# Patient Record
Sex: Male | Born: 1960 | Race: Black or African American | Hispanic: No | Marital: Married | Smoking: Former smoker
Health system: Southern US, Community
[De-identification: ages and names within clinical notes are randomized; demographics above are authoritative.]

## PROBLEM LIST (undated history)

## (undated) DIAGNOSIS — R0600 Dyspnea, unspecified: Secondary | ICD-10-CM

## (undated) DIAGNOSIS — H4050X0 Glaucoma secondary to other eye disorders, unspecified eye, stage unspecified: Secondary | ICD-10-CM

## (undated) MED FILL — Fosaprepitant Dimeglumine For IV Infusion 150 MG (Base Eq): INTRAVENOUS | Qty: 5 | Status: AC

---

## 2003-06-24 ENCOUNTER — Encounter (INDEPENDENT_AMBULATORY_CARE_PROVIDER_SITE_OTHER): Payer: Self-pay | Admitting: *Deleted

## 2003-06-24 ENCOUNTER — Ambulatory Visit (HOSPITAL_COMMUNITY): Admission: RE | Admit: 2003-06-24 | Discharge: 2003-06-24 | Payer: Self-pay | Admitting: Internal Medicine

## 2005-08-31 ENCOUNTER — Emergency Department (HOSPITAL_COMMUNITY): Admission: EM | Admit: 2005-08-31 | Discharge: 2005-09-01 | Payer: Self-pay | Admitting: Emergency Medicine

## 2009-03-13 ENCOUNTER — Emergency Department (HOSPITAL_COMMUNITY): Admission: EM | Admit: 2009-03-13 | Discharge: 2009-03-13 | Payer: Self-pay | Admitting: Emergency Medicine

## 2010-08-15 LAB — CBC
HCT: 41.8 % (ref 39.0–52.0)
Hemoglobin: 13.8 g/dL (ref 13.0–17.0)
MCHC: 33.1 g/dL (ref 30.0–36.0)
MCV: 87.8 fL (ref 78.0–100.0)
RDW: 15.1 % (ref 11.5–15.5)

## 2010-08-15 LAB — POCT I-STAT, CHEM 8
BUN: 13 mg/dL (ref 6–23)
Calcium, Ion: 1.15 mmol/L (ref 1.12–1.32)
Chloride: 108 mEq/L (ref 96–112)
Creatinine, Ser: 1 mg/dL (ref 0.4–1.5)
TCO2: 24 mmol/L (ref 0–100)

## 2010-08-15 LAB — WOUND CULTURE: Gram Stain: NONE SEEN

## 2010-08-15 LAB — DIFFERENTIAL
Basophils Absolute: 0 10*3/uL (ref 0.0–0.1)
Eosinophils Absolute: 0.1 10*3/uL (ref 0.0–0.7)
Lymphs Abs: 2 10*3/uL (ref 0.7–4.0)
Neutro Abs: 3.4 10*3/uL (ref 1.7–7.7)

## 2010-12-14 ENCOUNTER — Inpatient Hospital Stay (INDEPENDENT_AMBULATORY_CARE_PROVIDER_SITE_OTHER)
Admission: RE | Admit: 2010-12-14 | Discharge: 2010-12-14 | Disposition: A | Discharge status: Home / Self Care | Payer: Managed Care, Other (non HMO) | Source: Ambulatory Visit | Attending: Family Medicine | Admitting: Family Medicine

## 2010-12-14 DIAGNOSIS — L723 Sebaceous cyst: Secondary | ICD-10-CM

## 2016-11-06 ENCOUNTER — Encounter (HOSPITAL_COMMUNITY): Payer: Self-pay | Admitting: Emergency Medicine

## 2016-11-06 ENCOUNTER — Emergency Department (HOSPITAL_COMMUNITY)
Admission: EM | Admit: 2016-11-06 | Discharge: 2016-11-06 | Disposition: A | Discharge status: Home / Self Care | Payer: 59 | Attending: Emergency Medicine | Admitting: Emergency Medicine

## 2016-11-06 ENCOUNTER — Emergency Department (HOSPITAL_COMMUNITY): Payer: 59

## 2016-11-06 DIAGNOSIS — M545 Low back pain, unspecified: Secondary | ICD-10-CM

## 2016-11-06 DIAGNOSIS — Z87891 Personal history of nicotine dependence: Secondary | ICD-10-CM | POA: Insufficient documentation

## 2016-11-06 MED ORDER — METHOCARBAMOL 500 MG PO TABS: 500.0000 mg | ORAL_TABLET | Freq: Two times a day (BID) | ORAL | 0 refills | Status: DC | PRN

## 2016-11-06 MED ORDER — HYDROCODONE-ACETAMINOPHEN 5-325 MG PO TABS: 2.0000 | ORAL_TABLET | Freq: Four times a day (QID) | ORAL | 0 refills | Status: DC | PRN

## 2016-11-06 NOTE — ED Provider Notes (Signed)
[facility]-EMERGENCY DEPT Provider Note   CSN: [id number] Arrival date & time: 11/06/16  1119  By signing my name below, I, Linna Darner, attest that this documentation has been prepared under the direction and in the presence of [facility], PA-C. Electronically Signed: Linna Darner, Scribe. 11/06/2016. 12:05 PM.  History   Chief Complaint Chief Complaint  Patient presents with  . Back Pain   The history is provided by the patient. No language interpreter was used.    HPI Comments: Timothy Hunter is a 56 y.o. male who presents to the Emergency Department complaining of constant, waxing and waning, non-radiating lower back pain for three days. Patient also reports some intermittent lower back spasms that cause severe pain. He rates his current pain at 3/10 in severity but notes it is 10/10 at worst when spasms occur. There is no known injury/trigger of patient's back pain and he denies any recent frequent bending or heavy lifting. Patient states his back pain is worse with ambulating, position changes, and sitting for a prolonged period of time. He has tried a heating pad, ice, and Motrin with mild, transient improvement of his back pain. He has no prior h/o persistent back pain. No h/o kidney stones. No tobacco or regular alcohol use. He states he will smoke marijuana on rare occasions but denies any other illicit drug use. Does not drink water. Wife states typically will drink mountain dew or tea. He also gets charley horses occasionally, but none in the last few months. No IV drug use. NKDA. Patient denies numbness/tingling, focal weakness, urinary/bowel incontinence, saddle anesthesia, dysuria, hematuria, or any other associated symptoms.  History reviewed. No pertinent past medical history.  There are no active problems to display for this patient.   History reviewed. No pertinent surgical history.     Home Medications    Prior to Admission medications   Medication Sig Start Date  End Date Taking? Authorizing Provider  HYDROcodone-acetaminophen (NORCO/VICODIN) 5-325 MG tablet Take 2 tablets by mouth every 6 (six) hours as needed. 11/06/16   Halo Laski, Chase Picket, PA-C  methocarbamol (ROBAXIN) 500 MG tablet Take 1 tablet (500 mg total) by mouth 2 (two) times daily as needed for muscle spasms. 11/06/16   Genetta Fiero, Chase Picket, PA-C    Family History No family history on file.  Social History Social History  Substance Use Topics  . Smoking status: Former Games developer  . Smokeless tobacco: Not on file  . Alcohol use Yes     Comment: occasionally     Allergies   Patient has no known allergies.   Review of Systems Review of Systems  Gastrointestinal: Negative for abdominal pain, nausea and vomiting.       Negative for bowel incontinence.  Genitourinary: Negative for dysuria and hematuria.       Negative for urinary incontinence.  Musculoskeletal: Positive for back pain. Negative for neck pain.  Neurological: Negative for weakness and numbness.       Negative for saddle anesthesia.   Physical Exam Updated Vital Signs BP 126/76 (BP Location: Right Arm)   Pulse 64   Temp 98.2 F (36.8 C) (Oral)   Resp 18   Ht 5\' 9"  (1.753 m)   Wt 73.8 kg (162 lb 9.6 oz)   SpO2 99%   BMI 24.01 kg/m   Physical Exam  Constitutional: He is oriented to person, place, and time. He appears well-developed and well-nourished. No distress.  HENT:  Head: Normocephalic and atraumatic.  Cardiovascular: Normal rate, regular rhythm and  normal heart sounds.   No murmur heard. Pulmonary/Chest: Effort normal and breath sounds normal. No respiratory distress.  Abdominal: Soft. He exhibits no distension. There is no tenderness.  Musculoskeletal:  Diffuse tenderness across lumbar spine. SLR negative bilaterally for radicular symptoms. Full ROM. Steady gait.  Neurological: He is alert and oriented to person, place, and time. He displays normal reflexes.  Bilateral lower extremities  neurovascularly intact.   Skin: Skin is warm and dry.  Nursing note and vitals reviewed.  ED Treatments / Results  Labs (all labs ordered are listed, but only abnormal results are displayed) Labs Reviewed - No data to display  EKG  EKG Interpretation None       Radiology Dg Lumbar Spine Complete  Result Date: 11/06/2016 CLINICAL DATA:  Back pain.  Spasms. EXAM: LUMBAR SPINE - COMPLETE 4+ VIEW COMPARISON:  CT scan of the abdomen dated 09/09/2014 FINDINGS: There is slight narrowing of the L4-5 disc space. There is no subluxation. Prominent transverse processes of L5 articulate with the sacrum. No pars defects. No significant facet arthritis. Incidental note is made of aortic atherosclerosis. IMPRESSION: 1. Slight disc space narrowing at L4-5. 2. Aortic atherosclerosis. Electronically Signed   By: Francene Boyers M.D.   On: 11/06/2016 12:43    Procedures Procedures (including critical care time)  DIAGNOSTIC STUDIES: Oxygen Saturation is 100% on RA, normal by my interpretation.    COORDINATION OF CARE: 12:04 PM Discussed treatment plan with pt at bedside and pt agreed to plan.  Medications Ordered in ED Medications - No data to display   Initial Impression / Assessment and Plan / ED Course  I have reviewed the triage vital signs and the nursing notes.  Pertinent labs & imaging results that were available during my care of the patient were reviewed by me and considered in my medical decision making (see chart for details).    Timothy Hunter is a 56 y.o. male who presents to ED for low back spasms. Patient demonstrates no lower extremity weakness, saddle anesthesia, bowel or bladder incontinence or neuro deficits. No concern for cauda equina. X-ray obtained with slight disc space narrowing at L4-L5, but otherwise unremarkable. No fevers or other infectious symptoms to suggest that the patient's back pain is due to an infection. Lower extremities are neurovascularly intact and  patient is ambulating without difficulty. I have reviewed return precautions, including the development of any of these signs or symptoms and the patient has voiced understanding. I reviewed supportive care instructions. PCP or neurosurgery follow-up if symptoms do not improve. Referral information for both given. Reviewed [place] controlled substances database and patient has no active prescriptions. RX for robaxin and short course pain meds given. Patient voiced understanding and agreement with plan. All questions answered.   Final Clinical Impressions(s) / ED Diagnoses   Final diagnoses:  Acute bilateral low back pain without sciatica    New Prescriptions Discharge Medication List as of 11/06/2016  1:00 PM    START taking these medications   Details  HYDROcodone-acetaminophen (NORCO/VICODIN) 5-325 MG tablet Take 2 tablets by mouth every 6 (six) hours as needed., Starting Wed 11/06/2016, Print    methocarbamol (ROBAXIN) 500 MG tablet Take 1 tablet (500 mg total) by mouth 2 (two) times daily as needed for muscle spasms., Starting Wed 11/06/2016, Print       I personally performed the services described in this documentation, which was scribed in my presence. The recorded information has been reviewed and is accurate.    Shakaya Bhullar,  Chase PicketJaime Pilcher, PA-C 11/06/16 1405    Mesner, Barbara CowerJason, MD 11/06/16 [phone]

## 2016-11-06 NOTE — ED Notes (Signed)
Bed: WTR9 Expected date:  Expected time:  Means of arrival:  Comments: 

## 2016-11-06 NOTE — ED Triage Notes (Signed)
Pt c/o intermittent back spasms onset Friday, pain is progressive. Ambulatory. No injury. No bowel or bladder changes.  Worse with sitting, standing, moving certain ways.

## 2016-11-06 NOTE — Discharge Instructions (Signed)
It was my pleasure taking care of you today!   You have been seen in the Emergency Department today for back pain.   Ibuprofen as needed for pain. Robaxin is your muscle relaxer to take as needed. You will have a small amount of pain medication to use only as needed for severe pain - This can make you very drowsy - please do not drink alcohol, operate heavy machinery or drive on this medication. In addition to this, use ice and/or heat for additional pain relief.  Your back pain should get better over the next week. Please follow up with your doctor this week for a recheck if still having symptoms. If you do not have a primary doctor, see the information below. If you cannot get in with a primary doctor, you could also follow up with the spine clinic listed if symptoms do not improve.   COLD THERAPY DIRECTIONS:  Ice or gel packs can be used to reduce both pain and swelling. Ice is the most helpful within the first 24 to 48 hours after an injury or flareup from overusing a muscle or joint.  Ice is effective, has very few side effects, and is safe for most people to use.    Return to the ED for worsening back pain, fever, weakness or numbness of either leg, or if you develop either (1) an inability to urinate or have bowel movements, or (2) loss of your ability to control your bathroom functions (if you start having "accidents"), or if you develop other new symptoms that concern you.  To find a primary care or specialty doctor please call [phone] or [phone] to access "[place] Find a Doctor Service."  You may also go on the [facility] website at [email or web address]  There are also multiple [facility] practices throughout the [place] that are frequently accepting new patients. You may find a clinic that is close to your home and contact them.  [facility] - [address] [place] [phone]  [facility] in [place] (also locations in [place] and [place]) - [address] [place] 857 [phone]  [facility] - [address] [place] [phone]

## 2019-11-16 ENCOUNTER — Other Ambulatory Visit: Payer: Self-pay | Admitting: Urgent Care

## 2019-11-16 DIAGNOSIS — Z87891 Personal history of nicotine dependence: Secondary | ICD-10-CM

## 2019-12-03 ENCOUNTER — Other Ambulatory Visit: Payer: Self-pay | Admitting: Urgent Care

## 2019-12-07 ENCOUNTER — Ambulatory Visit
Admission: RE | Admit: 2019-12-07 | Discharge: 2019-12-07 | Disposition: A | Discharge status: Home / Self Care | Payer: No Typology Code available for payment source | Source: Ambulatory Visit | Attending: Urgent Care | Admitting: Urgent Care

## 2019-12-07 DIAGNOSIS — Z87891 Personal history of nicotine dependence: Secondary | ICD-10-CM

## 2019-12-26 DIAGNOSIS — L308 Other specified dermatitis: Secondary | ICD-10-CM | POA: Insufficient documentation

## 2020-01-12 DIAGNOSIS — L219 Seborrheic dermatitis, unspecified: Secondary | ICD-10-CM | POA: Insufficient documentation

## 2020-01-12 DIAGNOSIS — J439 Emphysema, unspecified: Secondary | ICD-10-CM | POA: Insufficient documentation

## 2020-10-20 ENCOUNTER — Other Ambulatory Visit: Payer: Self-pay | Admitting: Urgent Care

## 2020-10-20 DIAGNOSIS — R911 Solitary pulmonary nodule: Secondary | ICD-10-CM

## 2020-10-29 DIAGNOSIS — R3129 Other microscopic hematuria: Secondary | ICD-10-CM | POA: Insufficient documentation

## 2020-10-29 DIAGNOSIS — R911 Solitary pulmonary nodule: Secondary | ICD-10-CM | POA: Insufficient documentation

## 2021-12-11 IMAGING — CT CT CHEST LUNG CANCER SCREENING LOW DOSE W/O CM
2 of 5 series · 15 of 40 positions shown, 18 images · non-contrast
Comparison: None.

CLINICAL DATA: 59-year-old male former smoker, quit 5 years ago,
with 70 pack-year history of smoking, for initial lung cancer
screening

EXAM:
CT CHEST WITHOUT CONTRAST LOW-DOSE FOR LUNG CANCER SCREENING
TECHNIQUE: Multidetector CT imaging of the chest was performed following the
standard protocol without IV contrast.

[Series 4: lung 1.00 br44 cor · coronal · 0.66mm/px · 3 of 285 slices shown]
[im 57/285  lung]
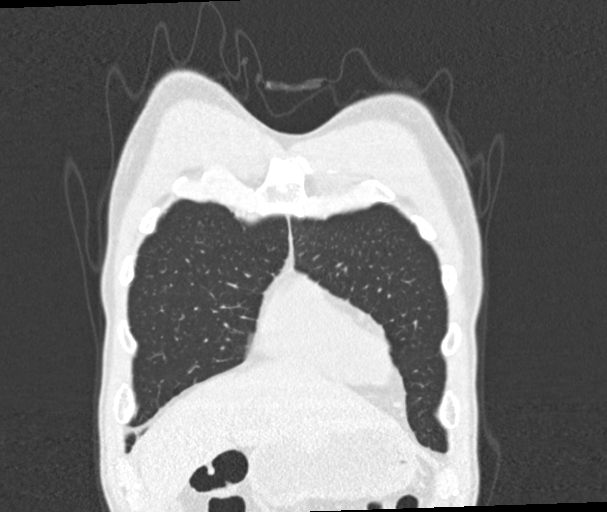
[im 114/285  lung]
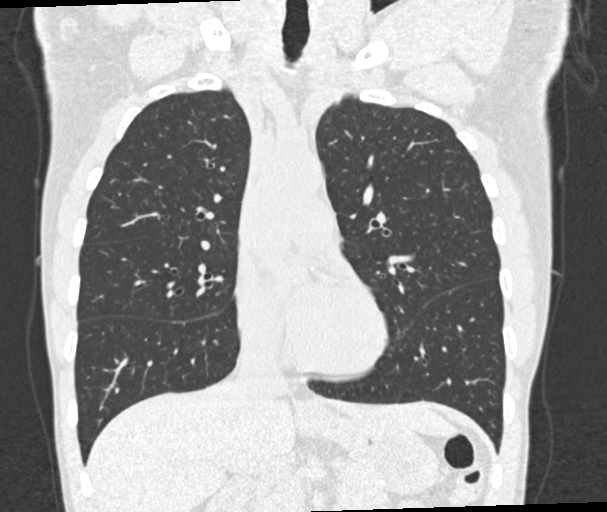
[im 171/285  lung]
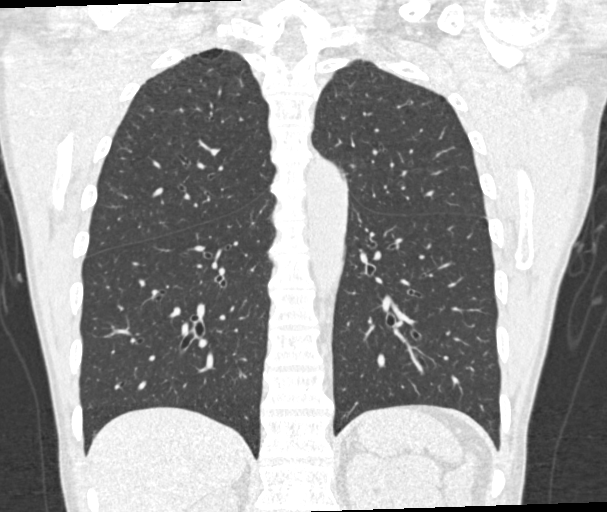

[Series 9: lung 1.00 br60 axial · axial · 0.77mm/px · z∈[-1105,-801]mm · 12 of 336 slices shown, 15 images]
[im 16/336  mediastinal]
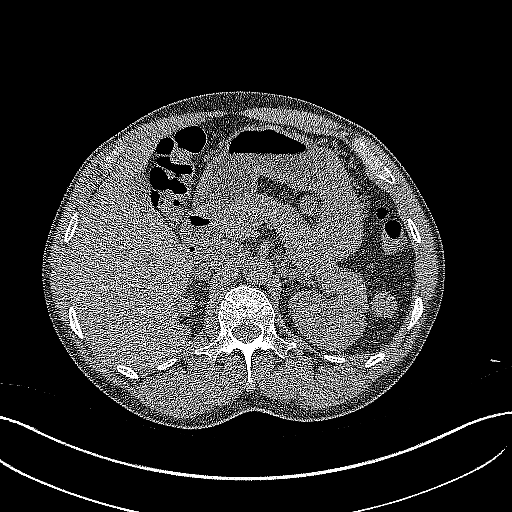
[im 16/336  lung]
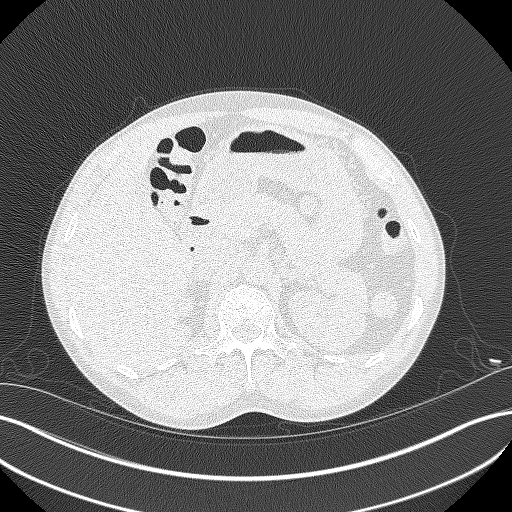
[im 46/336  lung]
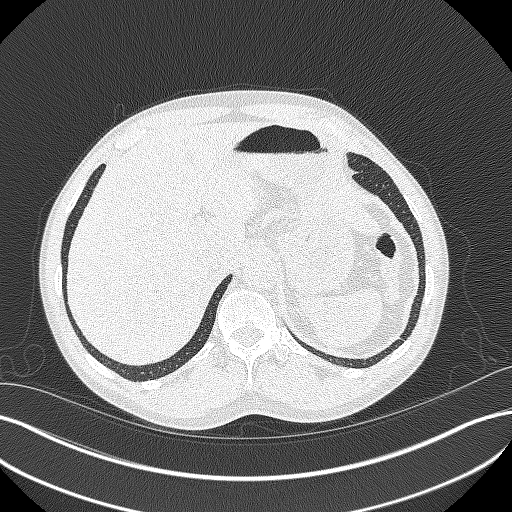
[im 77/336  lung]
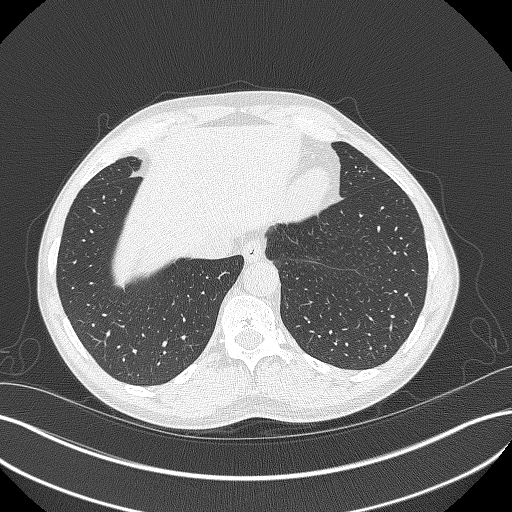
[im 107/336  lung]
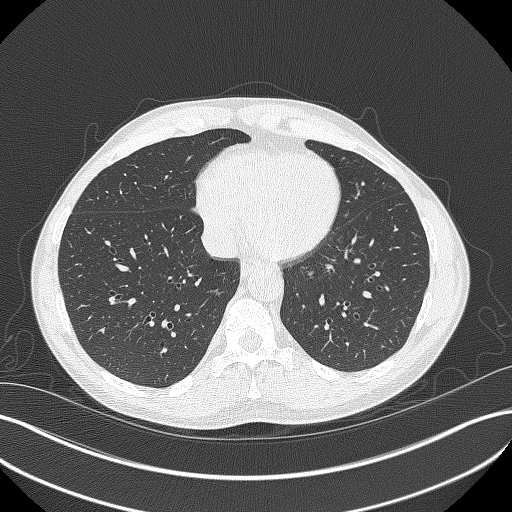
[im 122/336  mediastinal]
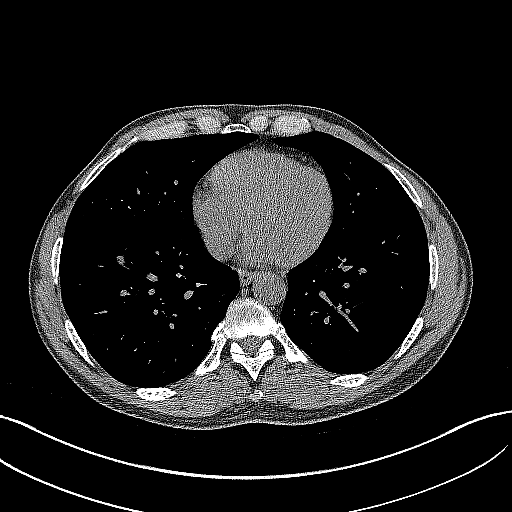
[im 122/336  lung]
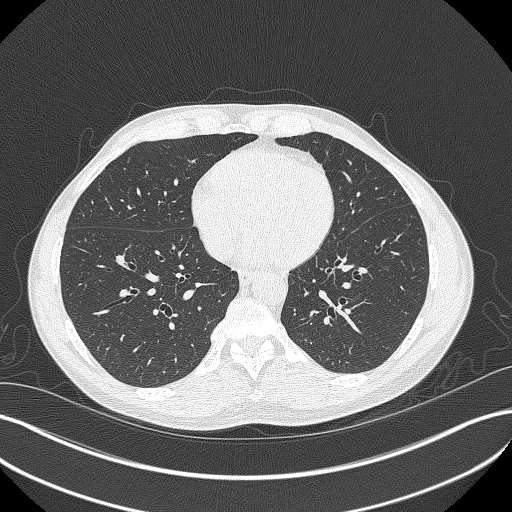
[im 153/336  lung]
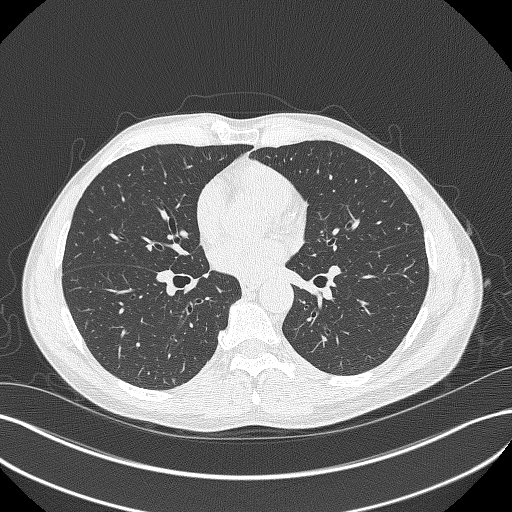
[im 183/336  lung]
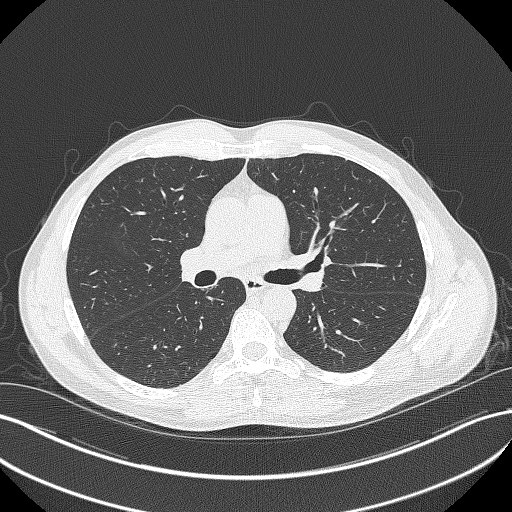
[im 214/336  lung]
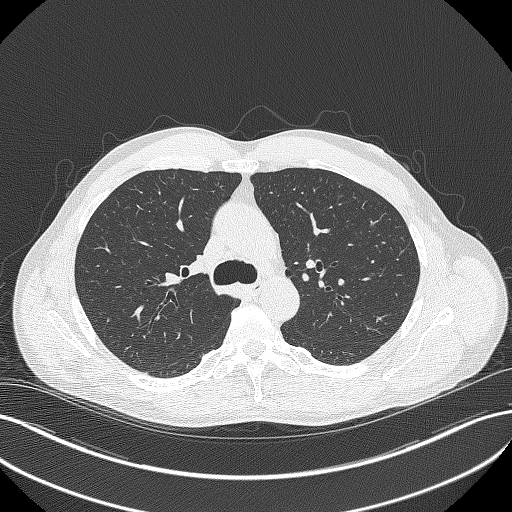
[im 229/336  mediastinal]
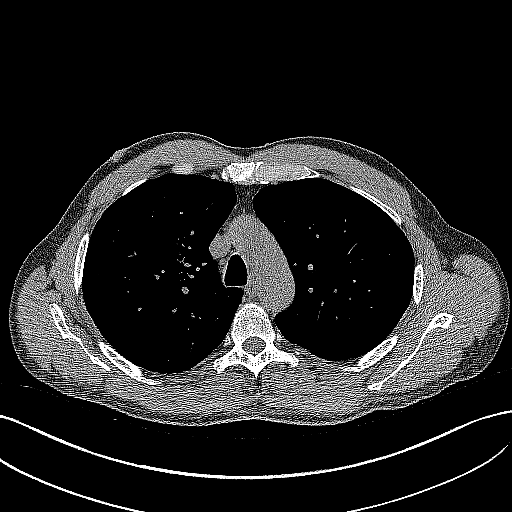
[im 229/336  lung]
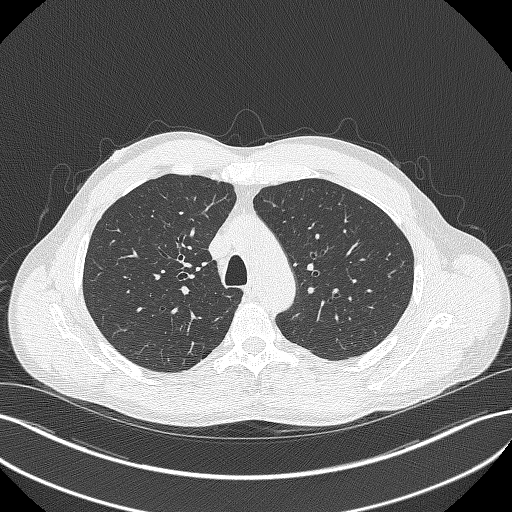
[im 259/336  lung]
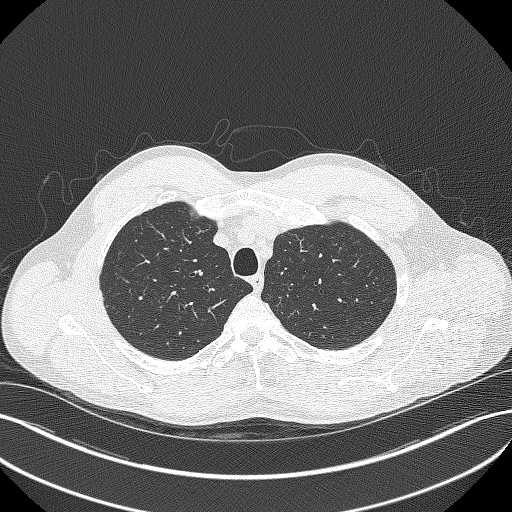
[im 290/336  lung]
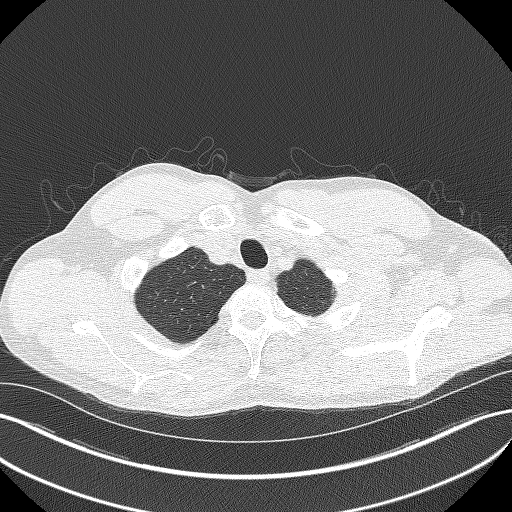
[im 320/336  lung]
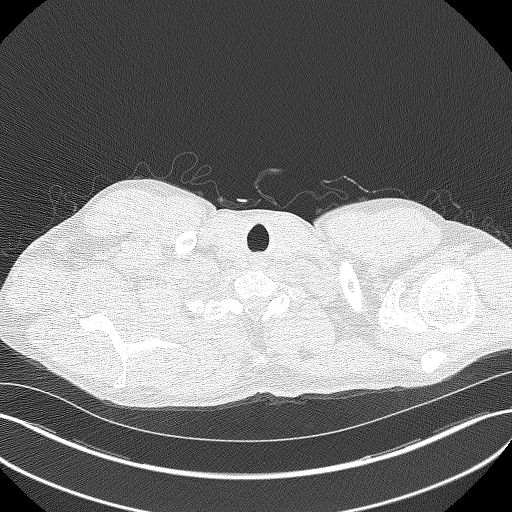

[15 of 40 positions shown; findings below may reference images not displayed]

FINDINGS: Cardiovascular: Heart is normal in size.  No pericardial effusion.

No evidence of thoracic aortic aneurysm. Mild atherosclerotic
calcifications of the aortic arch.

Mild coronary atherosclerosis of the LAD.

Mediastinum/Nodes: No suspicious mediastinal lymphadenopathy.

Visualized thyroid is unremarkable.

Lungs/Pleura: Mild biapical pleural-parenchymal scarring.

Mild centrilobular and paraseptal emphysematous changes, upper lung
predominant.

Two small right lung nodules measuring up to 3.5 mm in the right
lower lobe.

No focal consolidation.

No pleural effusion or pneumothorax.

Upper Abdomen: Visualized upper abdomen is grossly unremarkable.

Musculoskeletal: Visualized osseous structures are within normal
limits.
IMPRESSION: Lung-RADS 2, benign appearance or behavior. Continue annual
screening with low-dose chest CT without contrast in 12 months.

Aortic Atherosclerosis (IVOEK-FEC.C) and Emphysema (IVOEK-Z0L.C).

## 2022-01-23 DIAGNOSIS — Z87891 Personal history of nicotine dependence: Secondary | ICD-10-CM | POA: Insufficient documentation

## 2022-01-25 DIAGNOSIS — E119 Type 2 diabetes mellitus without complications: Secondary | ICD-10-CM | POA: Insufficient documentation

## 2022-04-09 ENCOUNTER — Other Ambulatory Visit: Payer: Self-pay

## 2022-04-09 ENCOUNTER — Ambulatory Visit: Payer: No Typology Code available for payment source | Attending: Neurosurgery | Admitting: Physical Therapy

## 2022-04-09 ENCOUNTER — Encounter: Payer: Self-pay | Admitting: Physical Therapy

## 2022-04-09 DIAGNOSIS — M5459 Other low back pain: Secondary | ICD-10-CM | POA: Insufficient documentation

## 2022-04-09 DIAGNOSIS — M6281 Muscle weakness (generalized): Secondary | ICD-10-CM | POA: Diagnosis present

## 2022-04-09 NOTE — Therapy (Signed)
OUTPATIENT PHYSICAL THERAPY THORACOLUMBAR EVALUATION  Patient Name: Timothy Hunter MRN: [id number] DOB:Jan 07, 1961, 61 y.o., male Today's Date: 04/09/2022   PT End of Session - 04/09/22 1159     Visit Number 1    Number of Visits --   1-2x/week   Date for PT Re-Evaluation 06/04/22    Authorization Type [employer]    PT Start Time 1130    PT Stop Time 1205    PT Time Calculation (min) 35 min             History reviewed. No pertinent past medical history. History reviewed. No pertinent surgical history. There are no problems to display for this patient.   PCP: Patient, No Pcp Per  REFERRING PROVIDER: Bedelia Person, MD  THERAPY DIAG:  Other low back pain - Plan: PT plan of care cert/re-cert  Muscle weakness - Plan: PT plan of care cert/re-cert  REFERRING DIAG: Dorsalgia, unspecified [M54.9]   Rationale for Evaluation and Treatment:  Rehabilitation  SUBJECTIVE:  PERTINENT PAST HISTORY:  None      PRECAUTIONS: None  WEIGHT BEARING RESTRICTIONS No  FALLS:  Has patient fallen in last 6 months? No, Number of falls: 0  MOI/History of condition:  Onset date: 9/14  SUBJECTIVE STATEMENT  Timothy Hunter is a 61 y.o. male who presents to clinic with chief complaint of low back pain which was isolated to start, but then "spread" to his abdomen and sides. He states that he had an x-ray which was normal and now must complete PT in order to get an MRI.  He was prescribed meloxicam which is somewhat helpful.  Denies n/t or pain in LE.  Denies BB changes.  Denies saddle anesthesia.  Denies significant weight loss.  He states that he has a physical job and had what he felt like a slight muscle strain and this slowly got worse over time.     Red flags:  denies   Pain:  Are you having pain? Yes Pain location: low back, abdomen (generalized) NPRS scale:  4/10 to 10/10 Aggravating factors: lifting, bending Relieving factors: rest, lying down Pain description:  intermittent, constant, and aching Stage: Chronic Stability: staying the same 24 hour pattern: no clear pattern   Occupation: lifting paper records (lifting mod/heavy boxes)  Assistive Device: NA  Hand Dominance: NA  Patient Goals/Specific Activities: reduce pain, get MRI   OBJECTIVE:   DIAGNOSTIC FINDINGS:  Pt reports x-ray was clear for significant abnormalities   GENERAL OBSERVATION/GAIT:  Pain moving from sitting to standing, gowers sign  SENSATION:  Light touch: Appears intact  MUSCLE LENGTH: Hamstrings: Right no restriction; Left no restriction ASLR: Right ASLR = PSLR; Left ASLR = PSLR Thomas test: Right subtle restriction; Left subtle restriction    LUMBAR AROM  AROM AROM  04/09/2022  Flexion Fingertips to mid shin (limited by ~25%), w/ concordant pain  Extension limited by 25%, w/ concordant pain  Right lateral flexion limited by 25%, w/ concordant pain  Left lateral flexion limited by 25%  Right rotation limited by 25%  Left rotation limited by 25%    (Blank rows = not tested)    LUMBAR SPECIAL TESTS:  Straight leg raise: L (-), R (-)  LE MMT:  MMT Right 04/09/2022 Left 04/09/2022  Hip flexion (L2, L3) 4+ 4+  Knee extension (L3) 4+ 4+  Knee flexion 4+ 4+  Hip abduction 3+ 3+  Hip extension 3+ 3+  Hip external rotation    Hip internal rotation  Hip adduction    Ankle dorsiflexion (L4)    Ankle plantarflexion (S1)    Ankle inversion    Ankle eversion    Great Toe ext (L5)    Grossly     (Blank rows = not tested, score listed is out of 5 possible points.  N = WNL, D = diminished, C = clear for gross weakness with myotome testing, * = concordant pain with testing)   LE ROM:  ROM Right 04/09/2022 Left 04/09/2022  Hip flexion N N  Hip extension    Hip abduction    Hip adduction    Hip internal rotation N N  Hip external rotation N N  Knee flexion    Knee extension    Ankle dorsiflexion    Ankle plantarflexion    Ankle  inversion    Ankle eversion      (Blank rows = not tested, N = WNL, * = concordant pain with testing)  Functional Tests  Eval (04/09/2022)    Bridge: unable full ROM                                                            PALPATION:   TTP R QL  PATIENT SURVEYS:  FOTO 39 - > 62   TODAY'S TREATMENT  Creating, reviewing, and completing below HEP  PATIENT EDUCATION:  POC, diagnosis, prognosis, HEP, and outcome measures.  Pt educated via explanation, demonstration, and handout (HEP).  Pt confirms understanding verbally.   HOME EXERCISE PROGRAM: Access Code: [email or web address] URL: [email or web address] Date: 04/09/2022 Prepared by: Alphonzo Severance  Exercises - Supine Posterior Pelvic Tilt  - 2 x daily - 7 x weekly - 2 sets - 10 reps - 5'' hold - Posterior Pelvic Tilt with Adduction Using Pillow in Hooklying  - 2 x daily - 7 x weekly - 10 reps - 10 second hold - Hooklying Isometric Clamshell  - 1 x daily - 7 x weekly - 3 sets - 10 reps  ASTERISK SIGNS   Asterisk Signs Eval (04/09/2022)       Fwd toe touch Gowers sign       Max pain 10/10       brige Unable to complete full range                         ASSESSMENT:  CLINICAL IMPRESSION: Timothy Hunter is a 61 y.o. male who presents to clinic with signs and sxs consistent with mechanical low back pain with atypical pain presentation.  No signs of radicular sxs on exam or subjective.  He has significant local tenderness over L QL and paraspinals  surrounding TL junction.  His abdominal pain presentation is atypical but being followed by MD.  If no improvement he plans on getting MRI.    OBJECTIVE IMPAIRMENTS: Pain, lumbar ROM, LE/hip/core stregth  ACTIVITY LIMITATIONS: bending, lifting, working  PERSONAL FACTORS: See medical history and pertinent history   REHAB POTENTIAL: Good  CLINICAL DECISION MAKING: Stable/uncomplicated  EVALUATION COMPLEXITY: Low   GOALS:   SHORT TERM GOALS: Target date:  05/07/2022  Timothy Hunter will be >75% HEP compliant to improve carryover between sessions and facilitate independent management of condition  Evaluation (04/09/2022): ongoing Goal status: INITIAL   LONG TERM GOALS: Target date: 06/04/2022  Timothy Hunter will improve  FOTO score to 62 as a proxy for functional improvement  Evaluation/Baseline (04/09/2022): 39 Goal status: INITIAL   2.  Zakariya will self report >/= 50% decrease in pain from evaluation   Evaluation/Baseline (04/09/2022): 10/10 max pain Goal status: INITIAL   3.  Westly will report confidence in self management of condition at time of discharge with advanced HEP  Evaluation/Baseline (04/09/2022): unable to self manage Goal status: INITIAL   4.  Kaiyan will be able to perform full ROM supine bridge, not limited by pain  Evaluation/Baseline (04/09/2022): limited Goal status: INITIAL    PLAN: PT FREQUENCY: 1-2x/week  PT DURATION: 8 weeks (Ending 06/04/2022)  PLANNED INTERVENTIONS: Therapeutic exercises, Aquatic therapy, Therapeutic activity, Neuro Muscular re-education, Gait training, Patient/Family education, Joint mobilization, Dry Needling, Electrical stimulation, Spinal mobilization and/or manipulation, Moist heat, Taping, Vasopneumatic device, Ionotophoresis 4mg /ml Dexamethasone, and Manual therapy  PLAN FOR NEXT SESSION: progressive hip/core/LE strengthening, modalities and manual PRN   Alphonzo Severance PT, DPT 04/09/2022, 12:15 PM

## 2022-04-17 ENCOUNTER — Encounter: Payer: Managed Care, Other (non HMO) | Admitting: Physical Therapy

## 2023-02-26 DIAGNOSIS — R61 Generalized hyperhidrosis: Secondary | ICD-10-CM | POA: Diagnosis not present

## 2023-02-26 DIAGNOSIS — R634 Abnormal weight loss: Secondary | ICD-10-CM | POA: Diagnosis not present

## 2023-02-26 DIAGNOSIS — R1013 Epigastric pain: Secondary | ICD-10-CM | POA: Diagnosis not present

## 2023-03-09 ENCOUNTER — Other Ambulatory Visit: Payer: Self-pay

## 2023-03-09 ENCOUNTER — Encounter (HOSPITAL_BASED_OUTPATIENT_CLINIC_OR_DEPARTMENT_OTHER): Payer: Self-pay

## 2023-03-09 ENCOUNTER — Emergency Department (HOSPITAL_BASED_OUTPATIENT_CLINIC_OR_DEPARTMENT_OTHER): Payer: 59

## 2023-03-09 ENCOUNTER — Inpatient Hospital Stay (HOSPITAL_BASED_OUTPATIENT_CLINIC_OR_DEPARTMENT_OTHER)
Admission: EM | Admit: 2023-03-09 | Discharge: 2023-03-12 | DRG: 357 | Disposition: A | Discharge status: Home / Self Care | Payer: 59 | Attending: Internal Medicine | Admitting: Internal Medicine

## 2023-03-09 DIAGNOSIS — D649 Anemia, unspecified: Secondary | ICD-10-CM | POA: Diagnosis not present

## 2023-03-09 DIAGNOSIS — K229 Disease of esophagus, unspecified: Secondary | ICD-10-CM | POA: Diagnosis not present

## 2023-03-09 DIAGNOSIS — D62 Acute posthemorrhagic anemia: Secondary | ICD-10-CM | POA: Diagnosis not present

## 2023-03-09 DIAGNOSIS — E876 Hypokalemia: Secondary | ICD-10-CM | POA: Diagnosis present

## 2023-03-09 DIAGNOSIS — K31811 Angiodysplasia of stomach and duodenum with bleeding: Secondary | ICD-10-CM | POA: Diagnosis not present

## 2023-03-09 DIAGNOSIS — K31A11 Gastric intestinal metaplasia without dysplasia, involving the antrum: Secondary | ICD-10-CM | POA: Diagnosis not present

## 2023-03-09 DIAGNOSIS — R9389 Abnormal findings on diagnostic imaging of other specified body structures: Secondary | ICD-10-CM | POA: Diagnosis not present

## 2023-03-09 DIAGNOSIS — Z87891 Personal history of nicotine dependence: Secondary | ICD-10-CM

## 2023-03-09 DIAGNOSIS — R1110 Vomiting, unspecified: Secondary | ICD-10-CM | POA: Diagnosis not present

## 2023-03-09 DIAGNOSIS — K922 Gastrointestinal hemorrhage, unspecified: Secondary | ICD-10-CM

## 2023-03-09 DIAGNOSIS — C801 Malignant (primary) neoplasm, unspecified: Secondary | ICD-10-CM | POA: Diagnosis not present

## 2023-03-09 DIAGNOSIS — N3000 Acute cystitis without hematuria: Secondary | ICD-10-CM | POA: Diagnosis present

## 2023-03-09 DIAGNOSIS — C8512 Unspecified B-cell lymphoma, intrathoracic lymph nodes: Secondary | ICD-10-CM | POA: Diagnosis not present

## 2023-03-09 DIAGNOSIS — R109 Unspecified abdominal pain: Secondary | ICD-10-CM | POA: Diagnosis not present

## 2023-03-09 DIAGNOSIS — H4050X0 Glaucoma secondary to other eye disorders, unspecified eye, stage unspecified: Secondary | ICD-10-CM

## 2023-03-09 DIAGNOSIS — Z8619 Personal history of other infectious and parasitic diseases: Secondary | ICD-10-CM | POA: Diagnosis not present

## 2023-03-09 DIAGNOSIS — K921 Melena: Secondary | ICD-10-CM | POA: Diagnosis not present

## 2023-03-09 DIAGNOSIS — R918 Other nonspecific abnormal finding of lung field: Secondary | ICD-10-CM | POA: Diagnosis not present

## 2023-03-09 DIAGNOSIS — Z79899 Other long term (current) drug therapy: Secondary | ICD-10-CM

## 2023-03-09 DIAGNOSIS — C7951 Secondary malignant neoplasm of bone: Secondary | ICD-10-CM | POA: Diagnosis not present

## 2023-03-09 DIAGNOSIS — J439 Emphysema, unspecified: Secondary | ICD-10-CM | POA: Diagnosis present

## 2023-03-09 DIAGNOSIS — K3189 Other diseases of stomach and duodenum: Secondary | ICD-10-CM | POA: Diagnosis not present

## 2023-03-09 DIAGNOSIS — C800 Disseminated malignant neoplasm, unspecified: Secondary | ICD-10-CM | POA: Diagnosis not present

## 2023-03-09 DIAGNOSIS — C799 Secondary malignant neoplasm of unspecified site: Secondary | ICD-10-CM

## 2023-03-09 DIAGNOSIS — R591 Generalized enlarged lymph nodes: Secondary | ICD-10-CM | POA: Diagnosis not present

## 2023-03-09 DIAGNOSIS — Q132 Other congenital malformations of iris: Secondary | ICD-10-CM

## 2023-03-09 DIAGNOSIS — C8598 Non-Hodgkin lymphoma, unspecified, lymph nodes of multiple sites: Secondary | ICD-10-CM | POA: Diagnosis not present

## 2023-03-09 DIAGNOSIS — H409 Unspecified glaucoma: Secondary | ICD-10-CM | POA: Diagnosis not present

## 2023-03-09 DIAGNOSIS — R0602 Shortness of breath: Secondary | ICD-10-CM | POA: Diagnosis not present

## 2023-03-09 DIAGNOSIS — F1290 Cannabis use, unspecified, uncomplicated: Secondary | ICD-10-CM | POA: Diagnosis not present

## 2023-03-09 DIAGNOSIS — R1013 Epigastric pain: Secondary | ICD-10-CM | POA: Diagnosis not present

## 2023-03-09 DIAGNOSIS — R634 Abnormal weight loss: Secondary | ICD-10-CM | POA: Diagnosis not present

## 2023-03-09 DIAGNOSIS — K31819 Angiodysplasia of stomach and duodenum without bleeding: Secondary | ICD-10-CM | POA: Diagnosis not present

## 2023-03-09 DIAGNOSIS — R590 Localized enlarged lymph nodes: Secondary | ICD-10-CM | POA: Diagnosis not present

## 2023-03-09 HISTORY — DX: Glaucoma secondary to other eye disorders, unspecified eye, stage unspecified: H40.50X0

## 2023-03-09 LAB — URINALYSIS, MICROSCOPIC (REFLEX)

## 2023-03-09 LAB — CBC WITH DIFFERENTIAL/PLATELET
Abs Immature Granulocytes: 0.03 10*3/uL (ref 0.00–0.07)
Basophils Absolute: 0 10*3/uL (ref 0.0–0.1)
Basophils Relative: 0 %
Eosinophils Absolute: 0.1 10*3/uL (ref 0.0–0.5)
Eosinophils Relative: 1 %
HCT: 26.8 % — ABNORMAL LOW (ref 39.0–52.0)
Hemoglobin: 7.7 g/dL — ABNORMAL LOW (ref 13.0–17.0)
Immature Granulocytes: 0 %
Lymphocytes Relative: 16 %
Lymphs Abs: 1.1 10*3/uL (ref 0.7–4.0)
MCH: 19 pg — ABNORMAL LOW (ref 26.0–34.0)
MCHC: 28.7 g/dL — ABNORMAL LOW (ref 30.0–36.0)
MCV: 66 fL — ABNORMAL LOW (ref 80.0–100.0)
Monocytes Absolute: 0.4 10*3/uL (ref 0.1–1.0)
Monocytes Relative: 6 %
Neutro Abs: 5.2 10*3/uL (ref 1.7–7.7)
Neutrophils Relative %: 77 %
Platelets: 389 10*3/uL (ref 150–400)
RBC: 4.06 MIL/uL — ABNORMAL LOW (ref 4.22–5.81)
RDW: 21.7 % — ABNORMAL HIGH (ref 11.5–15.5)
Smear Review: NORMAL
WBC: 6.7 10*3/uL (ref 4.0–10.5)
nRBC: 0 % (ref 0.0–0.2)

## 2023-03-09 LAB — COMPREHENSIVE METABOLIC PANEL
ALT: 11 U/L (ref 0–44)
AST: 15 U/L (ref 15–41)
Albumin: 3.4 g/dL — ABNORMAL LOW (ref 3.5–5.0)
Alkaline Phosphatase: 59 U/L (ref 38–126)
Anion gap: 13 (ref 5–15)
BUN: 9 mg/dL (ref 8–23)
CO2: 19 mmol/L — ABNORMAL LOW (ref 22–32)
Calcium: 8.9 mg/dL (ref 8.9–10.3)
Chloride: 103 mmol/L (ref 98–111)
Creatinine, Ser: 0.8 mg/dL (ref 0.61–1.24)
GFR, Estimated: >60 mL/min (ref >60)
Glucose, Bld: 124 mg/dL — ABNORMAL HIGH (ref 70–99)
Potassium: 3.2 mmol/L — ABNORMAL LOW (ref 3.5–5.1)
Sodium: 135 mmol/L (ref 135–145)
Total Bilirubin: 0.5 mg/dL (ref 0.3–1.2)
Total Protein: 8.6 g/dL — ABNORMAL HIGH (ref 6.5–8.1)

## 2023-03-09 LAB — URINALYSIS, ROUTINE W REFLEX MICROSCOPIC
Glucose, UA: NEGATIVE mg/dL
Ketones, ur: NEGATIVE mg/dL
Leukocytes,Ua: NEGATIVE
Nitrite: NEGATIVE
Protein, ur: 100 mg/dL — AB
Specific Gravity, Urine: 1.025 (ref 1.005–1.030)
pH: 7 (ref 5.0–8.0)

## 2023-03-09 LAB — LIPASE, BLOOD: Lipase: 30 U/L (ref 11–51)

## 2023-03-09 LAB — PROTIME-INR
INR: 1.2 (ref 0.8–1.2)
Prothrombin Time: 15.4 s — ABNORMAL HIGH (ref 11.4–15.2)

## 2023-03-09 LAB — OCCULT BLOOD X 1 CARD TO LAB, STOOL: Fecal Occult Bld: POSITIVE — AB

## 2023-03-09 MED ORDER — MORPHINE SULFATE (PF) 2 MG/ML IV SOLN
2.0000 mg | INTRAVENOUS | Status: DC | PRN
  Administered 2023-03-09 – 2023-03-12 (×2): 2 mg via INTRAVENOUS
  Filled 2023-03-09 (×2): qty 1

## 2023-03-09 MED ORDER — ALUM & MAG HYDROXIDE-SIMETH 200-200-20 MG/5ML PO SUSP
30.0000 mL | Freq: Once | ORAL | Status: AC
  Administered 2023-03-09: 30 mL via ORAL
  Filled 2023-03-09: qty 30

## 2023-03-09 MED ORDER — IOHEXOL 300 MG/ML  SOLN
75.0000 mL | Freq: Once | INTRAMUSCULAR | Status: AC | PRN
Start: 2023-03-09 — End: 2023-03-10
  Administered 2023-03-10: 75 mL via INTRAVENOUS

## 2023-03-09 MED ORDER — ENOXAPARIN SODIUM 40 MG/0.4ML IJ SOSY: 40.0000 mg | PREFILLED_SYRINGE | INTRAMUSCULAR | Status: DC

## 2023-03-09 MED ORDER — SODIUM CHLORIDE 0.9 % IV BOLUS
1000.0000 mL | Freq: Once | INTRAVENOUS | Status: AC
  Administered 2023-03-09: 1000 mL via INTRAVENOUS

## 2023-03-09 MED ORDER — PANTOPRAZOLE SODIUM 40 MG IV SOLR
40.0000 mg | Freq: Once | INTRAVENOUS | Status: AC
  Administered 2023-03-09: 40 mg via INTRAVENOUS

## 2023-03-09 MED ORDER — LATANOPROST 0.005 % OP SOLN
1.0000 [drp] | Freq: Every day | OPHTHALMIC | Status: DC
  Administered 2023-03-09 – 2023-03-11 (×3): 1 [drp] via OPHTHALMIC
  Filled 2023-03-09: qty 2.5

## 2023-03-09 MED ORDER — TIMOLOL MALEATE 0.5 % OP SOLN
1.0000 [drp] | Freq: Every day | OPHTHALMIC | Status: DC
Start: 2023-03-10 — End: 2023-03-12
  Administered 2023-03-10 – 2023-03-12 (×3): 1 [drp] via OPHTHALMIC
  Filled 2023-03-09: qty 5

## 2023-03-09 MED ORDER — ONDANSETRON HCL 4 MG PO TABS: 4.0000 mg | ORAL_TABLET | Freq: Four times a day (QID) | ORAL | Status: DC | PRN

## 2023-03-09 MED ORDER — TRAZODONE HCL 50 MG PO TABS: 25.0000 mg | ORAL_TABLET | Freq: Every evening | ORAL | Status: DC | PRN

## 2023-03-09 MED ORDER — MAGNESIUM HYDROXIDE 400 MG/5ML PO SUSP: 30.0000 mL | Freq: Every day | ORAL | Status: DC | PRN

## 2023-03-09 MED ORDER — FAMOTIDINE IN NACL 20-0.9 MG/50ML-% IV SOLN
20.0000 mg | Freq: Once | INTRAVENOUS | Status: AC
  Administered 2023-03-09: 20 mg via INTRAVENOUS
  Filled 2023-03-09: qty 50

## 2023-03-09 MED ORDER — ONDANSETRON HCL 4 MG/2ML IJ SOLN: 4.0000 mg | Freq: Four times a day (QID) | INTRAMUSCULAR | Status: DC | PRN

## 2023-03-09 MED ORDER — ONDANSETRON HCL 4 MG/2ML IJ SOLN
4.0000 mg | Freq: Once | INTRAMUSCULAR | Status: AC
  Administered 2023-03-09: 4 mg via INTRAVENOUS
  Filled 2023-03-09: qty 2

## 2023-03-09 MED ORDER — PANTOPRAZOLE SODIUM 40 MG IV SOLR
40.0000 mg | Freq: Two times a day (BID) | INTRAVENOUS | Status: DC
Start: 2023-03-10 — End: 2023-03-09

## 2023-03-09 MED ORDER — ACETAMINOPHEN 325 MG PO TABS: 650.0000 mg | ORAL_TABLET | Freq: Four times a day (QID) | ORAL | Status: DC | PRN

## 2023-03-09 MED ORDER — IOHEXOL 300 MG/ML  SOLN
75.0000 mL | Freq: Once | INTRAMUSCULAR | Status: AC | PRN
  Administered 2023-03-09: 75 mL via INTRAVENOUS

## 2023-03-09 MED ORDER — ACETAMINOPHEN 650 MG RE SUPP: 650.0000 mg | Freq: Four times a day (QID) | RECTAL | Status: DC | PRN

## 2023-03-09 MED ORDER — SODIUM CHLORIDE 0.9 % IV SOLN
1.0000 g | Freq: Once | INTRAVENOUS | Status: AC
  Administered 2023-03-09: 1 g via INTRAVENOUS
  Filled 2023-03-09: qty 10

## 2023-03-09 MED ORDER — PANTOPRAZOLE SODIUM 40 MG IV SOLR
40.0000 mg | INTRAVENOUS | Status: DC
  Filled 2023-03-09: qty 10

## 2023-03-09 MED ORDER — PANTOPRAZOLE SODIUM 40 MG IV SOLR
40.0000 mg | Freq: Two times a day (BID) | INTRAVENOUS | Status: DC
  Administered 2023-03-09 – 2023-03-11 (×4): 40 mg via INTRAVENOUS
  Filled 2023-03-09 (×4): qty 10

## 2023-03-09 MED ORDER — POTASSIUM CHLORIDE IN NACL 20-0.9 MEQ/L-% IV SOLN
INTRAVENOUS | Status: AC
  Filled 2023-03-09 (×3): qty 1000

## 2023-03-09 NOTE — Plan of Care (Signed)
This is a 62 year old gentleman with no past medical history who has been having abdominal pain for few months and some shortness of breath for few days who went to see somebody at urgent care on 1016, was discharged home on PPI but he continued to have abdominal pain and then started vomiting yesterday so he went to med center [facility] today.  Upon arrival, hemodynamically stable.  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  His hemoglobin is 7.7.  ED physician has already called and discussed with [facility] who recommended admission with clear liquid diet and n.p.o. from midnight.  They will see in consultation.  Also PPI twice daily.  Patient has been accepted under [facility] at either [facility] or [facility].  EDP to remain responsible for all patient care until patient arrives to either of those campuses and then [facility] will take over.  Nursing staff, please reach out to patient placement once patient arrives at floor for full admission by hospitalist.

## 2023-03-09 NOTE — Assessment & Plan Note (Signed)
-   The patient was directly admitted to a progressive unit bed. - Pain management will be provided. - Hematology and oncology consult can be obtained in AM.

## 2023-03-09 NOTE — Assessment & Plan Note (Signed)
We will continue his ophthalmic gtt.

## 2023-03-09 NOTE — H&P (Signed)
[place]   PATIENT NAME: Timothy Hunter    MR#:  [id number]  DATE OF BIRTH:  03-01-1961  DATE OF ADMISSION:  03/09/2023  PRIMARY CARE PHYSICIAN: Patient, No Pcp Per   Patient is coming from: Home  REQUESTING/REFERRING PHYSICIAN: [facility]  CHIEF COMPLAINT:   Chief Complaint  Patient presents with   Abdominal Pain    HISTORY OF PRESENT ILLNESS:  Timothy Hunter is a 62 y.o. African-American male with medical history significant for glaucoma, who presented to the emergency room with acute onset of abdominal pain over the last couple months mainly in the epigastric area with associated nausea and vomiting that started yesterday.  He admitted to mild chills without reported fever.  No chest pain or palpitations.  No cough or wheezing or dyspnea.  No dysuria, oliguria or hematuria or flank pain.  No melena or bright red being per rectum.  No other bleeding diathesis.  He has been having low back pain lately.  ED Course: Upon presentation to the emergency room BP was 147/82 with otherwise normal vital signs.  Labs revealed mild hypokalemia of 3.2 and a CO2 of 19 albumin of 3.4 and troponin 8.6 with otherwise unremarkable CMP.  CBC showed anemia with hemoglobin 7.7 hematocrit 26.8.No recent values for comparison.  His last H&H were 15 and 44 in 2010.  Stool Hemoccult came back positive. EKG as reviewed by me : EKG showed normal sinus rhythm with a rate of 79. Imaging: Two-view chest x-ray showed no acute cardiopulmonary disease. Abdominal and pelvic CT scan with contrast revealed the following: 1. Upper abdominal lymphadenopathy and retrocrural adenopathy is consistent with malignancy. 2. New sclerotic and lytic changes in the thoracic spine consistent with metastatic disease. 3. Nodule in the RIGHT lower lobe is new from prior concerning for metastatic disease. 4. Differential would include lung cancer versus lymphoma in patient with smoking history. Chest CT with contrast  report is currently pending.  The patient was given a gram of IV Rocephin, 30 mL p.o. Maalox, 20 mg of IV Pepcid, 4 mg of IV Zofran, 40 mg of IV Protonix and 1 L bolus of IV normal saline.  Contact was made with [facility] who accepted to see the patient in consultation.  The patient was directly admitted to a progressive unit bed for further evaluation and management. PAST MEDICAL HISTORY:   Past Medical History:  Diagnosis Date   Glaucoma associated with anomalies of iris     PAST SURGICAL HISTORY:  History reviewed. No pertinent surgical history. None. SOCIAL HISTORY:   Social History   Tobacco Use   Smoking status: Former   Smokeless tobacco: Not on file  Substance Use Topics   Alcohol use: Yes    Comment: occasionally    FAMILY HISTORY:  Positive for cancer.  DRUG ALLERGIES:  No Known Allergies  REVIEW OF SYSTEMS:   ROS As per history of present illness. All pertinent systems were reviewed above. Constitutional, HEENT, cardiovascular, respiratory, GI, GU, musculoskeletal, neuro, psychiatric, endocrine, integumentary and hematologic systems were reviewed and are otherwise negative/unremarkable except for positive findings mentioned above in the HPI.   MEDICATIONS AT HOME:   Prior to Admission medications   Medication Sig Start Date End Date Taking? Authorizing Provider  ibuprofen (ADVIL) 200 MG tablet Take 800 mg by mouth every 8 (eight) hours as needed (for pain).   Yes [provider]  latanoprost (XALATAN) 0.005 % ophthalmic solution Place 1 drop into both eyes at bedtime.  10/01/20  Yes [provider]  pantoprazole (PROTONIX) 40 MG tablet Take 40 mg by mouth daily before breakfast.   Yes [provider]  timolol (TIMOPTIC) 0.5 % ophthalmic solution Place 1 drop into both eyes in the morning. 10/01/20  Yes [provider]  HYDROcodone-acetaminophen (NORCO/VICODIN) 5-325 MG tablet Take 2 tablets by mouth every 6 (six) hours as  needed. Patient not taking: Reported on 03/09/2023 11/06/16   Ward, Chase Picket, PA-C  methocarbamol (ROBAXIN) 500 MG tablet Take 1 tablet (500 mg total) by mouth 2 (two) times daily as needed for muscle spasms. Patient not taking: Reported on 03/09/2023 11/06/16   Ward, Chase Picket, PA-C      VITAL SIGNS:  Blood pressure (!) 156/93, pulse 63, temperature 98.5 F (36.9 C), temperature source Oral, resp. rate 18, height 5\' 9"  (1.753 m), weight 74 kg, SpO2 100%.  PHYSICAL EXAMINATION:  Physical Exam  GENERAL:  62 y.o.-year-old African-American male patient lying in the bed with no acute distress.  EYES: Pupils equal, round, reactive to light and accommodation. No scleral icterus. Extraocular muscles intact.  HEENT: Head atraumatic, normocephalic. Oropharynx and nasopharynx clear.  NECK:  Supple, no jugular venous distention. No thyroid enlargement, no tenderness.  LUNGS: Normal breath sounds bilaterally, no wheezing, rales,rhonchi or crepitation. No use of accessory muscles of respiration.  CARDIOVASCULAR: Regular rate and rhythm, S1, S2 normal. No murmurs, rubs, or gallops.  ABDOMEN: Soft, nondistended, with mild epigastric tenderness without rebound tenderness guarding or rigidity. Bowel sounds present. No organomegaly or mass.  EXTREMITIES: No pedal edema, cyanosis, or clubbing.  NEUROLOGIC: Cranial nerves II through XII are intact. Muscle strength 5/5 in all extremities. Sensation intact. Gait not checked.  PSYCHIATRIC: The patient is alert and oriented x 3.  Normal affect and good eye contact. SKIN: No obvious rash, lesion, or ulcer.   LABORATORY PANEL:   CBC Recent Labs  Lab 03/09/23 1058  WBC 6.7  HGB 7.7*  HCT 26.8*  PLT 389   ------------------------------------------------------------------------------------------------------------------  Chemistries  Recent Labs  Lab 03/09/23 1058  NA 135  K 3.2*  CL 103  CO2 19*  GLUCOSE 124*  BUN 9  CREATININE 0.80   CALCIUM 8.9  AST 15  ALT 11  ALKPHOS 59  BILITOT 0.5   ------------------------------------------------------------------------------------------------------------------  Cardiac Enzymes No results for input(s): "TROPONINI" in the last 168 hours. ------------------------------------------------------------------------------------------------------------------  RADIOLOGY:  CT ABDOMEN PELVIS W CONTRAST  Result Date: 03/09/2023 CLINICAL DATA:  Acute abdominal pain.  Vomiting. EXAM: CT ABDOMEN AND PELVIS WITH CONTRAST TECHNIQUE: Multidetector CT imaging of the abdomen and pelvis was performed using the standard protocol following bolus administration of intravenous contrast. RADIATION DOSE REDUCTION: This exam was performed according to the departmental dose-optimization program which includes automated exposure control, adjustment of the mA and/or kV according to patient size and/or use of iterative reconstruction technique. CONTRAST:  75mL OMNIPAQUE IOHEXOL 300 MG/ML  SOLN COMPARISON:  CT chest 12/07/2019, CT abdomen pelvis 09/09/2014. FINDINGS: Lower chest: 10 mm nodule in the RIGHT lower lobe (image 8) is new from lung cancer screening CT in 2021. Thickened paraspinal pleura in the medial RIGHT lower lobe (image 1) is abnormal. Hepatobiliary: No focal hepatic lesion. Normal gallbladder. No biliary duct dilatation. Common bile duct is normal. Pancreas: Pancreas is normal. No ductal dilatation. No pancreatic inflammation. Spleen: Normal spleen Adrenals/urinary tract: Adrenal glands and kidneys are normal. The ureters and bladder normal. Stomach/Bowel: Stomach, small bowel, appendix, and cecum are normal. The colon and rectosigmoid colon are normal. Vascular/Lymphatic:  Enlarged lymph node LEFT aorta at the level the kidneys measures 24 mm on image 32. Aortocaval node at the same level measures 17 mm on image 30. Enlarged lymph nodes extend into the retrocrural space. For example 17 mm retrocrural node  on image 14. Large periportal lymph node measures 29 mm on image 20. No iliac lymphadenopathy. No inguinal lymphadenopathy. Reproductive: Prostate unremarkable Other: No free fluid. Musculoskeletal: Sclerotic changes in the lower thoracic spine new from comparison CT. Lytic lesion the posterior RIGHT aspect of the T11 vertebral body (image 10/301) is new from prior. IMPRESSION: 1. Upper abdominal lymphadenopathy and retrocrural adenopathy is consistent with malignancy. 2. New sclerotic and lytic changes in the thoracic spine consistent with metastatic disease. 3. Nodule in the RIGHT lower lobe is new from prior concerning for metastatic disease. 4. Differential would include lung cancer versus lymphoma in patient with smoking history. Electronically Signed   By: Genevive Bi M.D.   On: 03/09/2023 12:45   DG Chest 2 View  Result Date: 03/09/2023 CLINICAL DATA:  Shortness of breath.  Abdominal pain.  Vomiting. EXAM: CHEST - 2 VIEW COMPARISON:  None Available. FINDINGS: The heart size and mediastinal contours are within normal limits. Both lungs are clear. The visualized skeletal structures are unremarkable. IMPRESSION: No active cardiopulmonary disease. Electronically Signed   By: Signa Kell M.D.   On: 03/09/2023 12:28      IMPRESSION AND PLAN:  Assessment and Plan: * Metastatic disease (HCC) - The patient was directly admitted to a progressive unit bed. - Pain management will be provided. - Hematology and oncology consult can be obtained in AM.  GI bleeding - The patient has subsequent acute blood loss anemia that could be on top of chronic anemia. - GI consult will be obtained. - [facility] was notified. - I also notified Dr. Lorenso Quarry. - We will place him on IV PPI therapy. - We will follow serial hemoglobins and hematocrits.  Glaucoma - We will continue his ophthalmic gtt.   DVT prophylaxis: SCDs.  Medical prophylaxis currently contraindicated due to suspected GI  bleeding. Advanced Care Planning:  Code Status: full code. Family Communication:  The plan of care was discussed in details with the patient (and family). I answered all questions. The patient agreed to proceed with the above mentioned plan. Further management will depend upon hospital course. Disposition Plan: Back to previous home environment Consults called: GI was called.  Hematology and oncology consult can be called in a.m. All the records are reviewed and case discussed with ED provider.  Status is: Inpatient   At the time of the admission, it appears that the appropriate admission status for this patient is inpatient.  This is judged to be reasonable and necessary in order to provide the required intensity of service to ensure the patient's safety given the presenting symptoms, physical exam findings and initial radiographic and laboratory data in the context of comorbid conditions.  The patient requires inpatient status due to high intensity of service, high risk of further deterioration and high frequency of surveillance required.  I certify that at the time of admission, it is my clinical judgment that the patient will require inpatient hospital care extending more than 2 midnights.                            Dispo: The patient is from: Home              Anticipated d/c  is to: Home              Patient currently is not medically stable to d/c.              Difficult to place patient: No  Hannah Beat M.D on 03/09/2023 at 8:47 PM  [facility]   From 7 PM-7 AM, contact night-coverage [email or web address]  CC: Primary care physician; Patient, No Pcp Per

## 2023-03-09 NOTE — Assessment & Plan Note (Addendum)
-   The patient has subsequent acute blood loss anemia that could be on top of chronic anemia. - GI consult will be obtained. - [facility] was notified. - I also notified Dr. Lorenso Quarry. - We will place him on IV PPI therapy. - We will follow serial hemoglobins and hematocrits.

## 2023-03-09 NOTE — ED Provider Notes (Signed)
[place] [facility] Provider Note   CSN: [id number] Arrival date & time: 03/09/23  1029     History  Chief Complaint  Patient presents with   Abdominal Pain    Timothy Hunter is a 62 y.o. male.  Pt is a 62 yo male with no significant pmhx.  Pt said he has been having upper abd pain for the past few months.  He has been taking ibuprofen for it.  He went to UC on 10/16.  They gave him a rx for a ppi.  He said he's been taking it, but it has not been helping.  He had some vomiting yesterday, so he thought he'd come in and get checked.  He also has some sob at times.  No sob now.  Pt said he's had a colonoscopy a few years ago with [facility].  He has no PCP.       Home Medications Prior to Admission medications   Medication Sig Start Date End Date Taking? Authorizing Provider  HYDROcodone-acetaminophen (NORCO/VICODIN) 5-325 MG tablet Take 2 tablets by mouth every 6 (six) hours as needed. 11/06/16   Ward, Chase Picket, PA-C  methocarbamol (ROBAXIN) 500 MG tablet Take 1 tablet (500 mg total) by mouth 2 (two) times daily as needed for muscle spasms. 11/06/16   Ward, Chase Picket, PA-C      Allergies    Patient has no known allergies.    Review of Systems   Review of Systems  Respiratory:  Positive for shortness of breath.   Gastrointestinal:  Positive for abdominal pain.  All other systems reviewed and are negative.   Physical Exam Updated Vital Signs BP 137/85 (BP Location: Right Arm)   Pulse 81   Temp 98 F (36.7 C) (Oral)   Resp 16   Ht 5\' 9"  (1.753 m)   Wt 74 kg   SpO2 100%   BMI 24.09 kg/m  Physical Exam Vitals and nursing note reviewed. Exam conducted with a chaperone present.  Constitutional:      Appearance: He is well-developed.  HENT:     Head: Normocephalic and atraumatic.     Mouth/Throat:     Mouth: Mucous membranes are moist.     Pharynx: Oropharynx is clear.  Eyes:     Extraocular Movements: Extraocular  movements intact.     Pupils: Pupils are equal, round, and reactive to light.  Cardiovascular:     Rate and Rhythm: Normal rate and regular rhythm.  Pulmonary:     Effort: Pulmonary effort is normal.     Breath sounds: Normal breath sounds.  Abdominal:     General: Abdomen is flat. Bowel sounds are normal.     Palpations: Abdomen is soft.     Tenderness: There is abdominal tenderness in the epigastric area.  Genitourinary:    Rectum: Guaiac result positive.  Skin:    General: Skin is warm.  Neurological:     General: No focal deficit present.     Mental Status: He is alert and oriented to person, place, and time.  Psychiatric:        Mood and Affect: Mood normal.        Behavior: Behavior normal.     ED Results / Procedures / Treatments   Labs (all labs ordered are listed, but only abnormal results are displayed) Labs Reviewed  CBC WITH DIFFERENTIAL/PLATELET - Abnormal; Notable for the following components:      Result Value   RBC  4.06 (*)    Hemoglobin 7.7 (*)    HCT 26.8 (*)    MCV 66.0 (*)    MCH 19.0 (*)    MCHC 28.7 (*)    RDW 21.7 (*)    All other components within normal limits  COMPREHENSIVE METABOLIC PANEL - Abnormal; Notable for the following components:   Potassium 3.2 (*)    CO2 19 (*)    Glucose, Bld 124 (*)    Total Protein 8.6 (*)    Albumin 3.4 (*)    All other components within normal limits  URINALYSIS, ROUTINE W REFLEX MICROSCOPIC - Abnormal; Notable for the following components:   Hgb urine dipstick MODERATE (*)    Bilirubin Urine SMALL (*)    Protein, ur 100 (*)    All other components within normal limits  URINALYSIS, MICROSCOPIC (REFLEX) - Abnormal; Notable for the following components:   Bacteria, UA MANY (*)    All other components within normal limits  OCCULT BLOOD X 1 CARD TO LAB, STOOL - Abnormal; Notable for the following components:   Fecal Occult Bld POSITIVE (*)    All other components within normal limits  PROTIME-INR -  Abnormal; Notable for the following components:   Prothrombin Time 15.4 (*)    All other components within normal limits  URINE CULTURE  CULTURE, BLOOD (ROUTINE X 2)  CULTURE, BLOOD (ROUTINE X 2)  LIPASE, BLOOD    EKG EKG Interpretation Date/Time:  Sunday March 09 2023 10:40:10 EDT Ventricular Rate:  79 PR Interval:  125 QRS Duration:  101 QT Interval:  383 QTC Calculation: 439 R Axis:   63  Text Interpretation: Sinus rhythm Minimal ST depression, lateral leads No old tracing to compare Confirmed by Jacalyn Lefevre [phone]) on 03/09/2023 11:05:37 AM  Radiology DG Chest 2 View  Result Date: 03/09/2023 CLINICAL DATA:  Shortness of breath.  Abdominal pain.  Vomiting. EXAM: CHEST - 2 VIEW COMPARISON:  None Available. FINDINGS: The heart size and mediastinal contours are within normal limits. Both lungs are clear. The visualized skeletal structures are unremarkable. IMPRESSION: No active cardiopulmonary disease. Electronically Signed   By: Signa Kell M.D.   On: 03/09/2023 12:28    Procedures Procedures    Medications Ordered in ED Medications  cefTRIAXone (ROCEPHIN) 1 g in sodium chloride 0.9 % 100 mL IVPB (has no administration in time range)  sodium chloride 0.9 % bolus 1,000 mL (0 mLs Intravenous Stopped 03/09/23 1159)  famotidine (PEPCID) IVPB 20 mg premix (0 mg Intravenous Stopped 03/09/23 1128)  alum & mag hydroxide-simeth (MAALOX/MYLANTA) 200-200-20 MG/5ML suspension 30 mL (30 mLs Oral Given 03/09/23 1053)  ondansetron (ZOFRAN) injection 4 mg (4 mg Intravenous Given 03/09/23 1053)  iohexol (OMNIPAQUE) 300 MG/ML solution 75 mL (75 mLs Intravenous Contrast Given 03/09/23 1208)  pantoprazole (PROTONIX) injection 40 mg (40 mg Intravenous Given 03/09/23 1304)    ED Course/ Medical Decision Making/ A&P                                 Medical Decision Making Amount and/or Complexity of Data Reviewed Labs: ordered. Radiology: ordered.  Risk OTC drugs. Prescription  drug management. Decision regarding hospitalization.   This patient presents to the ED for concern of abd pain, this involves an extensive number of treatment options, and is a complaint that carries with it a high risk of complications and morbidity.  The differential diagnosis includes gastritis, ulcer, pancreatitis, cholecystitis  Co morbidities that complicate the patient evaluation  none   Additional history obtained:  Additional history obtained from epic chart review External records from outside source obtained and reviewed including wife   Lab Tests:  I Ordered, and personally interpreted labs.  The pertinent results include:  ua with mod hgb, small bili, protein, and many bacteria, cm; nl, lip nl   Imaging Studies ordered:  I ordered imaging studies including cxr, ct abd/pelvis  I independently visualized and interpreted imaging which showed  CXR: No active cardiopulmonary disease.  CT abd/pelvis: . Upper abdominal lymphadenopathy and retrocrural adenopathy is consistent with malignancy. 2. New sclerotic and lytic changes in the thoracic spine consistent with metastatic disease. 3. Nodule in the RIGHT lower lobe is new from prior concerning for metastatic disease. 4. Differential would include lung cancer versus lymphoma in patient with smoking history.  I agree with the radiologist interpretation   Cardiac Monitoring:  The patient was maintained on a cardiac monitor.  I personally viewed and interpreted the cardiac monitored which showed an underlying rhythm of: nsr   Medicines ordered and prescription drug management:  I ordered medication including zofran, pepcid, gi cocktail  for sx  Reevaluation of the patient after these medicines showed that the patient improved I have reviewed the patients home medicines and have made adjustments as needed   Test Considered:  Ct    Critical Interventions:  ppi   Consultations Obtained:  I requested  consultation with the gastroenterologist (Dr. Lorenso Quarry),  and discussed lab and imaging findings as well as pertinent plan - she recommends ppi q 12 hrs and npo after midnight Pt d/w Dr. Jacqulyn Bath ([facility]) who will admit   Problem List / ED Course:  Symptomatic anemia:  likely cause of sob.  He will need a transfusion, but we don't have blood here.  Pt is willing to receive a transfusion.  Upper abd pain:  likely from a bleeding ulcer.  Iv protonix ordered.  He will need to see gi when he gets to his admission bed. Metastatic lesions on CT:  no prior dx of cancer.  This will need further work up. UTI:  pt given rocephin.  Urine and blood cx sent.   Reevaluation:  After the interventions noted above, I reevaluated the patient and found that they have :improved   Social Determinants of Health:  Lives at home   Dispostion:  After consideration of the diagnostic results and the patients response to treatment, I feel that the patent would benefit from admission.    CRITICAL CARE Performed by: Jacalyn Lefevre   Total critical care time: 30 minutes  Critical care time was exclusive of separately billable procedures and treating other patients.  Critical care was necessary to treat or prevent imminent or life-threatening deterioration.  Critical care was time spent personally by me on the following activities: development of treatment plan with patient and/or surrogate as well as nursing, discussions with consultants, evaluation of patient's response to treatment, examination of patient, obtaining history from patient or surrogate, ordering and performing treatments and interventions, ordering and review of laboratory studies, ordering and review of radiographic studies, pulse oximetry and re-evaluation of patient's condition.         Final Clinical Impression(s) / ED Diagnoses Final diagnoses:  Symptomatic anemia  Upper GI bleed  Metastatic malignant neoplasm, unspecified site  [facility])  Acute cystitis without hematuria    Rx / DC Orders ED Discharge Orders     None  Jacalyn Lefevre, MD 03/09/23 [phone]

## 2023-03-09 NOTE — ED Triage Notes (Signed)
The patient having abd pain for a few months that got worse yesterday. He had some vomiting yesterday. He stated that its worse at night.

## 2023-03-10 ENCOUNTER — Inpatient Hospital Stay (HOSPITAL_COMMUNITY): Payer: 59

## 2023-03-10 DIAGNOSIS — D62 Acute posthemorrhagic anemia: Secondary | ICD-10-CM

## 2023-03-10 DIAGNOSIS — D649 Anemia, unspecified: Secondary | ICD-10-CM | POA: Diagnosis not present

## 2023-03-10 DIAGNOSIS — K922 Gastrointestinal hemorrhage, unspecified: Secondary | ICD-10-CM | POA: Diagnosis not present

## 2023-03-10 DIAGNOSIS — C799 Secondary malignant neoplasm of unspecified site: Secondary | ICD-10-CM | POA: Diagnosis not present

## 2023-03-10 DIAGNOSIS — H4050X0 Glaucoma secondary to other eye disorders, unspecified eye, stage unspecified: Secondary | ICD-10-CM | POA: Diagnosis not present

## 2023-03-10 DIAGNOSIS — R9389 Abnormal findings on diagnostic imaging of other specified body structures: Secondary | ICD-10-CM

## 2023-03-10 LAB — CBC
HCT: 24.2 % — ABNORMAL LOW (ref 39.0–52.0)
Hemoglobin: 6.7 g/dL — CL (ref 13.0–17.0)
MCH: 18.8 pg — ABNORMAL LOW (ref 26.0–34.0)
MCHC: 27.7 g/dL — ABNORMAL LOW (ref 30.0–36.0)
MCV: 68 fL — ABNORMAL LOW (ref 80.0–100.0)
Platelets: 348 10*3/uL (ref 150–400)
RBC: 3.56 MIL/uL — ABNORMAL LOW (ref 4.22–5.81)
RDW: 21.4 % — ABNORMAL HIGH (ref 11.5–15.5)
WBC: 6.6 10*3/uL (ref 4.0–10.5)
nRBC: 0 % (ref 0.0–0.2)

## 2023-03-10 LAB — FERRITIN: Ferritin: 18 ng/mL — ABNORMAL LOW (ref 24–336)

## 2023-03-10 LAB — VITAMIN B12: Vitamin B-12: 233 pg/mL (ref 180–914)

## 2023-03-10 LAB — BASIC METABOLIC PANEL
Anion gap: 6 (ref 5–15)
BUN: 7 mg/dL — ABNORMAL LOW (ref 8–23)
CO2: 23 mmol/L (ref 22–32)
Calcium: 8.6 mg/dL — ABNORMAL LOW (ref 8.9–10.3)
Chloride: 108 mmol/L (ref 98–111)
Creatinine, Ser: 0.82 mg/dL (ref 0.61–1.24)
GFR, Estimated: >60 mL/min (ref >60)
Glucose, Bld: 112 mg/dL — ABNORMAL HIGH (ref 70–99)
Potassium: 3.4 mmol/L — ABNORMAL LOW (ref 3.5–5.1)
Sodium: 137 mmol/L (ref 135–145)

## 2023-03-10 LAB — MAGNESIUM: Magnesium: 2.4 mg/dL (ref 1.7–2.4)

## 2023-03-10 LAB — IRON AND TIBC
Iron: 28 ug/dL — ABNORMAL LOW (ref 45–182)
Saturation Ratios: 9 % — ABNORMAL LOW (ref 17.9–39.5)
TIBC: 328 ug/dL (ref 250–450)
UIBC: 300 ug/dL

## 2023-03-10 LAB — ABO/RH: ABO/RH(D): O POS

## 2023-03-10 LAB — HIV ANTIBODY (ROUTINE TESTING W REFLEX): HIV Screen 4th Generation wRfx: NONREACTIVE

## 2023-03-10 LAB — FOLATE: Folate: 8.6 ng/mL (ref >5.9)

## 2023-03-10 LAB — PREPARE RBC (CROSSMATCH)

## 2023-03-10 MED ORDER — POTASSIUM CHLORIDE 10 MEQ/100ML IV SOLN
INTRAVENOUS | Status: AC
  Administered 2023-03-10: 10 meq via INTRAVENOUS
  Filled 2023-03-10: qty 200

## 2023-03-10 MED ORDER — POTASSIUM CHLORIDE 10 MEQ/100ML IV SOLN
10.0000 meq | INTRAVENOUS | Status: AC
Start: 2023-03-10 — End: 2023-03-10
  Administered 2023-03-10 (×3): 10 meq via INTRAVENOUS
  Filled 2023-03-10 (×2): qty 100

## 2023-03-10 MED ORDER — SODIUM CHLORIDE 0.9% IV SOLUTION: Freq: Once | INTRAVENOUS | Status: AC

## 2023-03-10 MED ORDER — SODIUM CHLORIDE 0.9 % IV SOLN: INTRAVENOUS | Status: DC

## 2023-03-10 NOTE — TOC Initial Note (Signed)
Transition of Care [facility]) - Initial/Assessment Note    Patient Details  Name: Timothy Hunter MRN: [id number] Date of Birth: 1960/09/21  Transition of Care [facility]) CM/SW Contact:    Howell Rucks, RN Phone Number: 03/10/2023, 1:47 PM  Clinical Narrative:   Met with pt and family at bedside to introduce role of TOC/NCM and review for dc planning. Pt reports he does not currently have a PCP, agreeable to NCM scheduling post hospital discharge appt at one of [facility], prefers clinic on [address]. Pt reports he has a pharmacy, no  current home  care services or home DME, reports he feels safe returning home with support from family, family to provide transporation at discharge. TOC will continue to follow.                 Expected Discharge Plan: Home/Self Care Barriers to Discharge: Continued Medical Work up   Patient Goals and CMS Choice Patient states their goals for this hospitalization and ongoing recovery are:: home with support from spouse/family          Expected Discharge Plan and Services       Living arrangements for the past 2 months: Single Family Home                                      Prior Living Arrangements/Services Living arrangements for the past 2 months: Single Family Home Lives with:: Spouse Patient language and need for interpreter reviewed:: Yes Do you feel safe going back to the place where you live?: Yes      Need for Family Participation in Patient Care: Yes (Comment) Care giver support system in place?: Yes (comment)   Criminal Activity/Legal Involvement Pertinent to Current Situation/Hospitalization: No - Comment as needed  Activities of Daily Living   ADL Screening (condition at time of admission) Independently performs ADLs?: Yes (appropriate for developmental age) Is the patient deaf or have difficulty hearing?: No Does the patient have difficulty seeing, even when wearing glasses/contacts?: Yes Does the  patient have difficulty concentrating, remembering, or making decisions?: No  Permission Sought/Granted                  Emotional Assessment Appearance:: Appears stated age Attitude/Demeanor/Rapport: Gracious Affect (typically observed): Accepting Orientation: : Oriented to Self, Oriented to Place, Oriented to  Time, Oriented to Situation Alcohol / Substance Use: Not Applicable Psych Involvement: No (comment)  Admission diagnosis:  Upper GI bleed [K92.2] Metastasis (HCC) [C79.9] Acute cystitis without hematuria [N30.00] Symptomatic anemia [D64.9] Metastatic malignant neoplasm, unspecified site 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 ([address]), [place] [address] [place] ([place]) [place] [address] Phone: [phone] Fax: [phone]     Social Determinants of Health (SDOH) Social History: SDOH Screenings   Food Insecurity: No Food Insecurity (03/09/2023)  Housing: Low Risk  (03/09/2023)  Transportation Needs: No Transportation Needs (03/09/2023)  Utilities: Not At Risk (03/09/2023)  Tobacco Use: Medium Risk (03/09/2023)   SDOH Interventions:     Readmission Risk Interventions    03/10/2023    1:46 PM  Readmission Risk Prevention Plan  Post Dischage Appt  Complete  Medication Screening Complete  Transportation Screening Complete

## 2023-03-10 NOTE — H&P (View-Only) (Signed)
[facility] Consult  Referring Provider: ER Primary Care Physician:  Patient, No Pcp Per Primary Gastroenterologist: Dr. Moss [facility]  Reason for Consultation: Melena, anemia  HPI: Timothy Hunter is a 62 y.o. male came to the ER with severe epigastric abdominal pain. Patient states that he was in his usual state of health until several months ago when he developed dull, stabbing, epigastric, lower sternal pain which became progressively worse and was associated with 1 episode of black stool several months ago. Few days ago because of unbearable pain he started taking ibuprofen 200 mg, 4 tablets at a time for 3 out of 7 days last week. He has not noticed any change in the color of the stool or urine. He reports 20 pounds of unintentional weight loss in the last 2 months. He denies acid reflux or heartburn. He finds himself chewing solids for a prolonged period of time so he does not get stuck, does not find himself doing that with liquids. He reports having an EGD in 2005, reports not available, biopsy showed severe gastritis related with H. pylori, patient reports being treated for H. pylori with antibiotics. Colonoscopy, 03/21/2020, Dr. Levora Angel, screening: 1 tubular adenoma and 1 hyperplastic polyp removed from sigmoid, repeat recommended in 5 years. Patient states he quit smoking in 2010, however he smoked at least for 40 years, 1 to 1-1/2 pack of cigarettes at a time. He retired in July. He will occasionally smokes marijuana but denies IV drug abuse. He rarely drinks alcohol. He states there are multiple family members with different types of cancer, although he is not able to specify.   Past Medical History:  Diagnosis Date   Glaucoma associated with anomalies of iris     History reviewed. No pertinent surgical history.  Prior to Admission medications   Medication Sig Start Date End Date Taking? Authorizing Provider  ibuprofen (ADVIL) 200 MG tablet Take  800 mg by mouth every 8 (eight) hours as needed (for pain).   Yes [provider]  latanoprost (XALATAN) 0.005 % ophthalmic solution Place 1 drop into both eyes at bedtime. 10/01/20  Yes [provider]  pantoprazole (PROTONIX) 40 MG tablet Take 40 mg by mouth daily before breakfast.   Yes [provider]  timolol (TIMOPTIC) 0.5 % ophthalmic solution Place 1 drop into both eyes in the morning. 10/01/20  Yes [provider]  HYDROcodone-acetaminophen (NORCO/VICODIN) 5-325 MG tablet Take 2 tablets by mouth every 6 (six) hours as needed. Patient not taking: Reported on 03/09/2023 11/06/16   Ward, Chase Picket, PA-C  methocarbamol (ROBAXIN) 500 MG tablet Take 1 tablet (500 mg total) by mouth 2 (two) times daily as needed for muscle spasms. Patient not taking: Reported on 03/09/2023 11/06/16   Ward, Chase Picket, PA-C    Current Facility-Administered Medications  Medication Dose Route Frequency Provider Last Rate Last Admin   0.9 %  sodium chloride infusion (Manually program via Guardrails IV Fluids)   Intravenous Once Luiz Iron, NP       0.9 % NaCl with KCl 20 mEq/ L  infusion   Intravenous Continuous Mansy, Jan A, MD 100 mL/hr at 03/10/23 0351 New Bag at 03/10/23 0351   acetaminophen (TYLENOL) tablet 650 mg  650 mg Oral Q6H PRN Mansy, Jan A, MD       Or   acetaminophen (TYLENOL) suppository 650 mg  650 mg Rectal Q6H PRN Mansy, Jan A, MD       latanoprost (XALATAN) 0.005 % ophthalmic solution 1  drop  1 drop Both Eyes QHS Mansy, Jan A, MD   1 drop at 03/09/23 2300   magnesium hydroxide (MILK OF MAGNESIA) suspension 30 mL  30 mL Oral Daily PRN Mansy, Jan A, MD       morphine (PF) 2 MG/ML injection 2 mg  2 mg Intravenous Q4H PRN Mansy, Jan A, MD   2 mg at 03/09/23 2055   ondansetron (ZOFRAN) tablet 4 mg  4 mg Oral Q6H PRN Mansy, Jan A, MD       Or   ondansetron [facility]) injection 4 mg  4 mg Intravenous Q6H PRN Mansy, Jan A, MD       pantoprazole (PROTONIX)  injection 40 mg  40 mg Intravenous Q12H Mansy, Jan A, MD   40 mg at 03/10/23 0904   potassium chloride 10 mEq in 100 mL IVPB  10 mEq Intravenous Q1 Hr x 4 Rodolph Bong, MD 100 mL/hr at 03/10/23 0906 10 mEq at 03/10/23 0906   timolol (TIMOPTIC) 0.5 % ophthalmic solution 1 drop  1 drop Both Eyes Daily Mansy, Jan A, MD   1 drop at 03/10/23 0904   traZODone (DESYREL) tablet 25 mg  25 mg Oral QHS PRN Mansy, Jan A, MD        Allergies as of 03/09/2023   (No Known Allergies)    History reviewed. No pertinent family history.  Social History   Socioeconomic History   Marital status: Widowed    Spouse name: Not on file   Number of children: Not on file   Years of education: Not on file   Highest education level: Not on file  Occupational History   Not on file  Tobacco Use   Smoking status: Former   Smokeless tobacco: Not on file  Substance and Sexual Activity   Alcohol use: Yes    Comment: occasionally   Drug use: Yes    Types: Marijuana   Sexual activity: Not on file  Other Topics Concern   Not on file  Social History Narrative   Not on file   Social Determinants of Health   Financial Resource Strain: Not on file  Food Insecurity: No Food Insecurity (03/09/2023)   Hunger Vital Sign    Worried About Running Out of Food in the Last Year: Never true    Ran Out of Food in the Last Year: Never true  Transportation Needs: No Transportation Needs (03/09/2023)   PRAPARE - Administrator, Civil Service (Medical): No    Lack of Transportation (Non-Medical): No  Physical Activity: Not on file  Stress: Not on file  Social Connections: Not on file  Intimate Partner Violence: Not At Risk (03/09/2023)   Humiliation, Afraid, Rape, and Kick questionnaire    Fear of Current or Ex-Partner: No    Emotionally Abused: No    Physically Abused: No    Sexually Abused: No    Review of Systems: As per HPI  Physical Exam: Vital signs in last 24 hours: Temp:  [98 F (36.7  C)-99.2 F (37.3 C)] 98.7 F (37.1 C) (10/28 0457) Pulse Rate:  [63-81] 63 (10/28 0457) Resp:  [16-19] 19 (10/28 0457) BP: (134-160)/(79-98) 134/81 (10/28 0457) SpO2:  [100 %] 100 % (10/28 0457) Weight:  [74 kg] 74 kg (10/27 1035) Last BM Date : 03/08/23 (per patient)  General:   Alert,  Well-developed, well-nourished, pleasant and cooperative in NAD Head:  Normocephalic and atraumatic. Eyes:  Sclera clear, no icterus.  Prominent pallor Ears:  Normal auditory acuity. Nose:  No deformity, discharge,  or lesions. Mouth:  No deformity or lesions.  Oropharynx pink & moist. Neck:  Supple; no masses or thyromegaly. Lungs:  Clear throughout to auscultation.   No wheezes, crackles, or rhonchi. No acute distress. Heart:  Regular rate and rhythm; no murmurs, clicks, rubs,  or gallops. Extremities:  Without clubbing or edema. Neurologic:  Alert and  oriented x4;  grossly normal neurologically. Skin:  Intact without significant lesions or rashes. Psych:  Alert and cooperative. Normal mood and affect. Abdomen:  Soft, nontender and nondistended. No masses, hepatosplenomegaly or hernias noted. Normal bowel sounds, without guarding, and without rebound.         Lab Results: Recent Labs    03/09/23 1058 03/10/23 0425  WBC 6.7 6.6  HGB 7.7* 6.7*  HCT 26.8* 24.2*  PLT 389 348   BMET Recent Labs    03/09/23 1058 03/10/23 0425  NA 135 137  K 3.2* 3.4*  CL 103 108  CO2 19* 23  GLUCOSE 124* 112*  BUN 9 7*  CREATININE 0.80 0.82  CALCIUM 8.9 8.6*   LFT Recent Labs    03/09/23 1058  PROT 8.6*  ALBUMIN 3.4*  AST 15  ALT 11  ALKPHOS 59  BILITOT 0.5   PT/INR Recent Labs    03/09/23 1058  LABPROT 15.4*  INR 1.2    Studies/Results: CT Chest W Contrast  Result Date: 03/10/2023 CLINICAL DATA:  Respiratory illness EXAM: CT CHEST WITH CONTRAST TECHNIQUE: Multidetector CT imaging of the chest was performed during intravenous contrast administration. RADIATION DOSE REDUCTION:  This exam was performed according to the departmental dose-optimization program which includes automated exposure control, adjustment of the mA and/or kV according to patient size and/or use of iterative reconstruction technique. CONTRAST:  75mL OMNIPAQUE IOHEXOL 300 MG/ML  SOLN COMPARISON:  Chest x-ray 03/09/2023, CT 03/09/2023 FINDINGS: Cardiovascular: Mild aortic atherosclerosis. No aneurysm. Coronary vascular calcification. Normal cardiac size. No pericardial effusion Mediastinum/Nodes: Midline trachea. No thyroid mass. Borderline AP window lymph nodes measuring up to 10 mm. Right hilar node measuring 1.5 mm. Subcarinal lymph node measuring 16 mm. Multiple enlarged posterior mediastinal lymph nodes, measuring up to 14 mm. Lungs/Pleura: Mild emphysema. No acute airspace disease, pleural effusion or pneumothorax. 2 adjacent right lung base pulmonary nodules. Irregular 12 mm right lung base pulmonary nodule series 8, image 134, and adjacent 7 mm oval pulmonary nodule on series 8, image 132. No other suspicious pulmonary nodules. Upper Abdomen: See separately dictated abdominopelvic CT. Partially visualized retroperitoneal and upper abdominal adenopathy. Musculoskeletal: Heterogeneous sclerosis and lucency in the T1, T9 and T11 vertebral bodies with mild sclerosis at T10 and L1, consistent with skeletal metastatic disease. Mild loss of height of the T9 vertebral body. Associated right paravertebral soft tissue mass. Right paravertebral soft tissue mass also evident at T11, there may be epidural tissue at T11 as well. IMPRESSION: 1. No CT evidence for acute intrathoracic abnormality. 2. Mild emphysema. 2 adjacent right lung base pulmonary nodules measuring up to 12 mm, possible metastatic disease. 3. Mediastinal and right hilar adenopathy, suspicious for metastatic or lymphoproliferative disease. 4. Skeletal metastatic disease involving the T1, T9 and T11 vertebral bodies with mild loss of height of the T9 vertebral  body. Right paravertebral soft tissue mass at T9 and T11, there may be epidural extension of tissue at T11 as well. This may be correlated with dedicated thoracic MRI. 5. Partially visualized retroperitoneal and upper abdominal adenopathy. 6. Aortic atherosclerosis. Aortic Atherosclerosis (ICD10-I70.0) and  Emphysema (ICD10-J43.9). Electronically Signed   By: Jasmine Pang M.D.   On: 03/10/2023 01:49   CT ABDOMEN PELVIS W CONTRAST  Result Date: 03/09/2023 CLINICAL DATA:  Acute abdominal pain.  Vomiting. EXAM: CT ABDOMEN AND PELVIS WITH CONTRAST TECHNIQUE: Multidetector CT imaging of the abdomen and pelvis was performed using the standard protocol following bolus administration of intravenous contrast. RADIATION DOSE REDUCTION: This exam was performed according to the departmental dose-optimization program which includes automated exposure control, adjustment of the mA and/or kV according to patient size and/or use of iterative reconstruction technique. CONTRAST:  75mL OMNIPAQUE IOHEXOL 300 MG/ML  SOLN COMPARISON:  CT chest 12/07/2019, CT abdomen pelvis 09/09/2014. FINDINGS: Lower chest: 10 mm nodule in the RIGHT lower lobe (image 8) is new from lung cancer screening CT in 2021. Thickened paraspinal pleura in the medial RIGHT lower lobe (image 1) is abnormal. Hepatobiliary: No focal hepatic lesion. Normal gallbladder. No biliary duct dilatation. Common bile duct is normal. Pancreas: Pancreas is normal. No ductal dilatation. No pancreatic inflammation. Spleen: Normal spleen Adrenals/urinary tract: Adrenal glands and kidneys are normal. The ureters and bladder normal. Stomach/Bowel: Stomach, small bowel, appendix, and cecum are normal. The colon and rectosigmoid colon are normal. Vascular/Lymphatic: Enlarged lymph node LEFT aorta at the level the kidneys measures 24 mm on image 32. Aortocaval node at the same level measures 17 mm on image 30. Enlarged lymph nodes extend into the retrocrural space. For example 17 mm  retrocrural node on image 14. Large periportal lymph node measures 29 mm on image 20. No iliac lymphadenopathy. No inguinal lymphadenopathy. Reproductive: Prostate unremarkable Other: No free fluid. Musculoskeletal: Sclerotic changes in the lower thoracic spine new from comparison CT. Lytic lesion the posterior RIGHT aspect of the T11 vertebral body (image 10/301) is new from prior. IMPRESSION: 1. Upper abdominal lymphadenopathy and retrocrural adenopathy is consistent with malignancy. 2. New sclerotic and lytic changes in the thoracic spine consistent with metastatic disease. 3. Nodule in the RIGHT lower lobe is new from prior concerning for metastatic disease. 4. Differential would include lung cancer versus lymphoma in patient with smoking history. Electronically Signed   By: Genevive Bi M.D.   On: 03/09/2023 12:45   DG Chest 2 View  Result Date: 03/09/2023 CLINICAL DATA:  Shortness of breath.  Abdominal pain.  Vomiting. EXAM: CHEST - 2 VIEW COMPARISON:  None Available. FINDINGS: The heart size and mediastinal contours are within normal limits. Both lungs are clear. The visualized skeletal structures are unremarkable. IMPRESSION: No active cardiopulmonary disease. Electronically Signed   By: Signa Kell M.D.   On: 03/09/2023 12:28    Impression: Anemia, NSAID use, suspect upper GI bleeding Hemoglobin 6.7, MCV 68, likely related to iron deficiency  Unintentional weight loss, epigastric pain, low total protein of 8.6, low albumin of 3.4 CT abdomen pelvis with contrast: 10 mm right lower lobe nodule, normal liver, normal gallbladder, normal pancreas, normal stomach, small bowel, cecum, appendix, colon and rectosigmoid, enlarged left aortic lymph node, enlarged lymph node in retrocrural space, large periportal lymph node, sclerotic changes in lower thoracic spine consistent with metastatic disease, differential would include lung cancer versus lymphoma  CT chest: Mediastinal and right hilar  adenopathy suspicious for metastatic or lymphoproliferative disease, skeletal metastatic disease involving T1, T9 and T11, right paravertebral soft tissue mass  History of H. pylori associated gastritis, treated with antibiotics in 2005 as per patient    Plan: EGD tomorrow for further evaluation, has been started on pantoprazole 40 mg every 12 hours,  stable hemodynamics, 1 unit PRBC transfusion has been requested. Patient will need oncology consult for further evaluation of metastatic disease.   LOS: 1 day   Kerin Salen, MD  03/10/2023, 10:23 AM

## 2023-03-10 NOTE — Progress Notes (Signed)
PROGRESS NOTE    Timothy Hunter  LOV:[id number] DOB: 01/19/1961 DOA: 03/09/2023 PCP: Patient, No Pcp Per    Chief Complaint  Patient presents with   Abdominal Pain    Brief Narrative:  HPI per Dr. Darrel Reach is a 62 y.o. African-American male with medical history significant for glaucoma, who presented to the emergency room with acute onset of abdominal pain over the last couple months mainly in the epigastric area with associated nausea and vomiting that started yesterday.  He admitted to mild chills without reported fever.  No chest pain or palpitations.  No cough or wheezing or dyspnea.  No dysuria, oliguria or hematuria or flank pain.  No melena or bright red being per rectum.  No other bleeding diathesis.  He has been having low back pain lately.   ED Course: Upon presentation to the emergency room BP was 147/82 with otherwise normal vital signs.  Labs revealed mild hypokalemia of 3.2 and a CO2 of 19 albumin of 3.4 and troponin 8.6 with otherwise unremarkable CMP.  CBC showed anemia with hemoglobin 7.7 hematocrit 26.8.No recent values for comparison.  His last H&H were 15 and 44 in 2010.  Stool Hemoccult came back positive. EKG as reviewed by me : EKG showed normal sinus rhythm with a rate of 79. Imaging: Two-view chest x-ray showed no acute cardiopulmonary disease. Abdominal and pelvic CT scan with contrast revealed the following: 1. Upper abdominal lymphadenopathy and retrocrural adenopathy is consistent with malignancy. 2. New sclerotic and lytic changes in the thoracic spine consistent with metastatic disease. 3. Nodule in the RIGHT lower lobe is new from prior concerning for metastatic disease. 4. Differential would include lung cancer versus lymphoma in patient with smoking history. Chest CT with contrast report is currently pending.   The patient was given a gram of IV Rocephin, 30 mL p.o. Maalox, 20 mg of IV Pepcid, 4 mg of IV Zofran, 40 mg of IV Protonix  and 1 L bolus of IV normal saline.  Contact was made with [facility] who accepted to see the patient in consultation.  The patient was directly admitted to a progressive unit bed for further evaluation and management.  Assessment & Plan:   Principal Problem:   Metastatic disease (HCC) Active Problems:   GI bleeding   Glaucoma   Glaucoma associated with anomalies of iris   Symptomatic anemia   Acute blood loss anemia   Abnormal CT scan   Upper GI bleed  #1 GI bleed/acute blood loss anemia/microcytic anemia -Patient pleasant 62 year old gentleman, prior history of tobacco use, presenting to the ED with a several months history of worsening shortness of breath on exertion, epigastric abdominal pain, recent NSAID use.  Prior history of H. pylori gastritis treated per patient. -FOBT done on admission was positive. -Hemoglobin on admission was 7.7 with last hemoglobin noted at 15.0 03/13/2009 per epic.  MCV of 68.0. -Check an anemia panel. -Repeat hemoglobin this morning at 6.7. -1 unit PRBCs ordered for transfusion. -Continue IV PPI every 12 hours. -Patient seen in consultation by GI and patient for upper endoscopy for further evaluation to be done tomorrow and patient subsequently placed on clear liquids today. -Follow H&H. -Transfusion threshold hemoglobin < 7.  2.  Abnormal CT chest/CT abdomen and pelvis/??  Metastatic disease -CT chest with emphysema, pulmonary nodules noted concerning for metastatic disease, mediastinal and right hilar adenopathy suspicious for metastatic or lymphoproliferative disease, skeletal metastatic disease involving T1, T9, T11 vertebral bodies with mild loss  of height of T9.  Right paravertebral soft tissue mass at T9 and 11. -CT abdomen and pelvis with upper abdominal lymphadenopathy and retrocrural adenopathy consistent with malignancy.  New sclerotic and lytic changes in the thoracic spine consistent with metastatic disease.  Nodule in the right lower lobe new  for prior concerning for metastatic disease. -Consult IR for biopsy. -Consult oncology for further evaluation and management.  3.  Hypokalemia -Check a magnesium level. -Replete.  4.  Glaucoma -Continue home eyedrops.      DVT prophylaxis: SCDs Code Status: Full Family Communication: Updated patient.  No family at bedside. Disposition: Home once clinically improved and cleared by oncology and GI.  Status is: Inpatient Remains inpatient appropriate because: Severity of illness   Consultants:  Gastroenterology: Dr. Marca Ancona 03/10/2023 Oncology pending  Procedures:  Transfusion 1 unit PRBCs pending 03/10/2023 CT chest 03/10/2023 CT abdomen and pelvis 03/09/2023 Chest x-ray 03/09/2023  Antimicrobials:  Anti-infectives (From admission, onward)    Start     Dose/Rate Route Frequency Ordered Stop   03/09/23 1445  cefTRIAXone (ROCEPHIN) 1 g in sodium chloride 0.9 % 100 mL IVPB        1 g 200 mL/hr over 30 Minutes Intravenous  Once 03/09/23 1434 03/10/23 0344         Subjective: Patient sitting up in bed.  Denies any chest pain.  Still with chronic shortness of breath ongoing for several months states shortness of breath on minimal exertion to his mailbox at home.  Patient denies any overt bleeding.  Denies any nausea or vomiting.  Stated it is just saw a gastroenterologist and will be scheduled for endoscopy tomorrow.  Does endorse NSAID use prior to admission.  Objective: Vitals:   03/10/23 0457 03/10/23 1132 03/10/23 1220 03/10/23 1309  BP: 134/81 136/75 (!) 146/82 132/78  Pulse: 63 77 81 71  Resp: 19 18 18 20   Temp: 98.7 F (37.1 C) 98.6 F (37 C) 98.8 F (37.1 C) 98.5 F (36.9 C)  TempSrc: Oral Oral Oral Oral  SpO2: 100% 100% 100% 100%  Weight:      Height:        Intake/Output Summary (Last 24 hours) at 03/10/2023 1517 Last data filed at 03/10/2023 1309 Gross per 24 hour  Intake 796.77 ml  Output 1200 ml  Net -403.23 ml   Filed Weights   03/09/23  1035  Weight: 74 kg    Examination:  General exam: Appears calm and comfortable  Respiratory system: Clear to auscultation. Respiratory effort normal. Cardiovascular system: S1 & S2 heard, RRR. No JVD, murmurs, rubs, gallops or clicks. No pedal edema. Gastrointestinal system: Abdomen is nondistended, soft and some tenderness to palpation epigastrium.  Positive bowel sounds.  No rebound.  No guarding.  Central nervous system: Alert and oriented. No focal neurological deficits. Extremities: Symmetric 5 x 5 power. Skin: No rashes, lesions or ulcers Psychiatry: Judgement and insight appear normal. Mood & affect appropriate.     Data Reviewed: I have personally reviewed following labs and imaging studies  CBC: Recent Labs  Lab 03/09/23 1058 03/10/23 0425  WBC 6.7 6.6  NEUTROABS 5.2  --   HGB 7.7* 6.7*  HCT 26.8* 24.2*  MCV 66.0* 68.0*  PLT 389 348    Basic Metabolic Panel: Recent Labs  Lab 03/09/23 1058 03/10/23 0425  NA 135 137  K 3.2* 3.4*  CL 103 108  CO2 19* 23  GLUCOSE 124* 112*  BUN 9 7*  CREATININE 0.80 0.82  CALCIUM 8.9 8.6*  GFR: Estimated Creatinine Clearance: 93.4 mL/min (by C-G formula based on SCr of 0.82 mg/dL).  Liver Function Tests: Recent Labs  Lab 03/09/23 1058  AST 15  ALT 11  ALKPHOS 59  BILITOT 0.5  PROT 8.6*  ALBUMIN 3.4*    CBG: No results for input(s): "GLUCAP" in the last 168 hours.   Recent Results (from the past 240 hour(s))  Culture, blood (routine x 2)     Status: None (Preliminary result)   Collection Time: 03/09/23  2:45 PM   Specimen: BLOOD  Result Value Ref Range Status   Specimen Description   Final    BLOOD LEFT ANTECUBITAL Performed at [facility], [address] [place], [place] [address]    Special Requests   Final    BOTTLES DRAWN AEROBIC AND ANAEROBIC Blood Culture adequate volume Performed at [facility], [address] [place], [place] [address]    Culture   Final    NO  GROWTH < 12 HOURS Performed at [facility], [address] [place], [place] [address]    Report Status PENDING  Incomplete  Culture, blood (routine x 2)     Status: None (Preliminary result)   Collection Time: 03/09/23  6:53 PM   Specimen: BLOOD  Result Value Ref Range Status   Specimen Description   Final    BLOOD BLOOD RIGHT ARM Performed at [facility], [address], [place], [place] [address]    Special Requests   Final    Blood Culture adequate volume BOTTLES DRAWN AEROBIC AND ANAEROBIC Performed at [facility], [address] [place], [place] [address]    Culture   Final    NO GROWTH < 12 HOURS Performed at [facility], [address], [place], [place] [address]    Report Status PENDING  Incomplete         Radiology Studies: CT Chest W Contrast  Result Date: 03/10/2023 CLINICAL DATA:  Respiratory illness EXAM: CT CHEST WITH CONTRAST TECHNIQUE: Multidetector CT imaging of the chest was performed during intravenous contrast administration. RADIATION DOSE REDUCTION: This exam was performed according to the departmental dose-optimization program which includes automated exposure control, adjustment of the mA and/or kV according to patient size and/or use of iterative reconstruction technique. CONTRAST:  75mL OMNIPAQUE IOHEXOL 300 MG/ML  SOLN COMPARISON:  Chest x-ray 03/09/2023, CT 03/09/2023 FINDINGS: Cardiovascular: Mild aortic atherosclerosis. No aneurysm. Coronary vascular calcification. Normal cardiac size. No pericardial effusion Mediastinum/Nodes: Midline trachea. No thyroid mass. Borderline AP window lymph nodes measuring up to 10 mm. Right hilar node measuring 1.5 mm. Subcarinal lymph node measuring 16 mm. Multiple enlarged posterior mediastinal lymph nodes, measuring up to 14 mm. Lungs/Pleura: Mild emphysema. No acute airspace disease, pleural effusion or pneumothorax. 2 adjacent right lung base pulmonary nodules.  Irregular 12 mm right lung base pulmonary nodule series 8, image 134, and adjacent 7 mm oval pulmonary nodule on series 8, image 132. No other suspicious pulmonary nodules. Upper Abdomen: See separately dictated abdominopelvic CT. Partially visualized retroperitoneal and upper abdominal adenopathy. Musculoskeletal: Heterogeneous sclerosis and lucency in the T1, T9 and T11 vertebral bodies with mild sclerosis at T10 and L1, consistent with skeletal metastatic disease. Mild loss of height of the T9 vertebral body. Associated right paravertebral soft tissue mass. Right paravertebral soft tissue mass also evident at T11, there may be epidural tissue at T11 as well. IMPRESSION: 1. No CT evidence for acute intrathoracic abnormality. 2.  Mild emphysema. 2 adjacent right lung base pulmonary nodules measuring up to 12 mm, possible metastatic disease. 3. Mediastinal and right hilar adenopathy, suspicious for metastatic or lymphoproliferative disease. 4. Skeletal metastatic disease involving the T1, T9 and T11 vertebral bodies with mild loss of height of the T9 vertebral body. Right paravertebral soft tissue mass at T9 and T11, there may be epidural extension of tissue at T11 as well. This may be correlated with dedicated thoracic MRI. 5. Partially visualized retroperitoneal and upper abdominal adenopathy. 6. Aortic atherosclerosis. Aortic Atherosclerosis (ICD10-I70.0) and Emphysema (ICD10-J43.9). Electronically Signed   By: Jasmine Pang M.D.   On: 03/10/2023 01:49   CT ABDOMEN PELVIS W CONTRAST  Result Date: 03/09/2023 CLINICAL DATA:  Acute abdominal pain.  Vomiting. EXAM: CT ABDOMEN AND PELVIS WITH CONTRAST TECHNIQUE: Multidetector CT imaging of the abdomen and pelvis was performed using the standard protocol following bolus administration of intravenous contrast. RADIATION DOSE REDUCTION: This exam was performed according to the departmental dose-optimization program which includes automated exposure control,  adjustment of the mA and/or kV according to patient size and/or use of iterative reconstruction technique. CONTRAST:  75mL OMNIPAQUE IOHEXOL 300 MG/ML  SOLN COMPARISON:  CT chest 12/07/2019, CT abdomen pelvis 09/09/2014. FINDINGS: Lower chest: 10 mm nodule in the RIGHT lower lobe (image 8) is new from lung cancer screening CT in 2021. Thickened paraspinal pleura in the medial RIGHT lower lobe (image 1) is abnormal. Hepatobiliary: No focal hepatic lesion. Normal gallbladder. No biliary duct dilatation. Common bile duct is normal. Pancreas: Pancreas is normal. No ductal dilatation. No pancreatic inflammation. Spleen: Normal spleen Adrenals/urinary tract: Adrenal glands and kidneys are normal. The ureters and bladder normal. Stomach/Bowel: Stomach, small bowel, appendix, and cecum are normal. The colon and rectosigmoid colon are normal. Vascular/Lymphatic: Enlarged lymph node LEFT aorta at the level the kidneys measures 24 mm on image 32. Aortocaval node at the same level measures 17 mm on image 30. Enlarged lymph nodes extend into the retrocrural space. For example 17 mm retrocrural node on image 14. Large periportal lymph node measures 29 mm on image 20. No iliac lymphadenopathy. No inguinal lymphadenopathy. Reproductive: Prostate unremarkable Other: No free fluid. Musculoskeletal: Sclerotic changes in the lower thoracic spine new from comparison CT. Lytic lesion the posterior RIGHT aspect of the T11 vertebral body (image 10/301) is new from prior. IMPRESSION: 1. Upper abdominal lymphadenopathy and retrocrural adenopathy is consistent with malignancy. 2. New sclerotic and lytic changes in the thoracic spine consistent with metastatic disease. 3. Nodule in the RIGHT lower lobe is new from prior concerning for metastatic disease. 4. Differential would include lung cancer versus lymphoma in patient with smoking history. Electronically Signed   By: Genevive Bi M.D.   On: 03/09/2023 12:45   DG Chest 2  View  Result Date: 03/09/2023 CLINICAL DATA:  Shortness of breath.  Abdominal pain.  Vomiting. EXAM: CHEST - 2 VIEW COMPARISON:  None Available. FINDINGS: The heart size and mediastinal contours are within normal limits. Both lungs are clear. The visualized skeletal structures are unremarkable. IMPRESSION: No active cardiopulmonary disease. Electronically Signed   By: Signa Kell M.D.   On: 03/09/2023 12:28        Scheduled Meds:  latanoprost  1 drop Both Eyes QHS   pantoprazole (PROTONIX) IV  40 mg Intravenous Q12H   timolol  1 drop Both Eyes Daily   Continuous Infusions:  sodium chloride     0.9 % NaCl with KCl 20 mEq / L Stopped (03/10/23 1159)  LOS: 1 day    Time spent: 40 minutes    Ramiro Harvest, MD [facility]   To contact the attending provider between 7A-7P or the covering provider during after hours 7P-7A, please log into the web site [email or web address] and access using universal [place] password for that web site. If you do not have the password, please call the hospital operator.  03/10/2023, 3:17 PM

## 2023-03-10 NOTE — Plan of Care (Signed)

## 2023-03-10 NOTE — Consult Note (Signed)
[facility] Consult  Referring Provider: ER Primary Care Physician:  Patient, No Pcp Per Primary Gastroenterologist: Dr. Moss [facility]  Reason for Consultation: Melena, anemia  HPI: Timothy Hunter is a 62 y.o. male came to the ER with severe epigastric abdominal pain. Patient states that he was in his usual state of health until several months ago when he developed dull, stabbing, epigastric, lower sternal pain which became progressively worse and was associated with 1 episode of black stool several months ago. Few days ago because of unbearable pain he started taking ibuprofen 200 mg, 4 tablets at a time for 3 out of 7 days last week. He has not noticed any change in the color of the stool or urine. He reports 20 pounds of unintentional weight loss in the last 2 months. He denies acid reflux or heartburn. He finds himself chewing solids for a prolonged period of time so he does not get stuck, does not find himself doing that with liquids. He reports having an EGD in 2005, reports not available, biopsy showed severe gastritis related with H. pylori, patient reports being treated for H. pylori with antibiotics. Colonoscopy, 03/21/2020, Dr. Levora Angel, screening: 1 tubular adenoma and 1 hyperplastic polyp removed from sigmoid, repeat recommended in 5 years. Patient states he quit smoking in 2010, however he smoked at least for 40 years, 1 to 1-1/2 pack of cigarettes at a time. He retired in July. He will occasionally smokes marijuana but denies IV drug abuse. He rarely drinks alcohol. He states there are multiple family members with different types of cancer, although he is not able to specify.   Past Medical History:  Diagnosis Date   Glaucoma associated with anomalies of iris     History reviewed. No pertinent surgical history.  Prior to Admission medications   Medication Sig Start Date End Date Taking? Authorizing Provider  ibuprofen (ADVIL) 200 MG tablet Take  800 mg by mouth every 8 (eight) hours as needed (for pain).   Yes [provider]  latanoprost (XALATAN) 0.005 % ophthalmic solution Place 1 drop into both eyes at bedtime. 10/01/20  Yes [provider]  pantoprazole (PROTONIX) 40 MG tablet Take 40 mg by mouth daily before breakfast.   Yes [provider]  timolol (TIMOPTIC) 0.5 % ophthalmic solution Place 1 drop into both eyes in the morning. 10/01/20  Yes [provider]  HYDROcodone-acetaminophen (NORCO/VICODIN) 5-325 MG tablet Take 2 tablets by mouth every 6 (six) hours as needed. Patient not taking: Reported on 03/09/2023 11/06/16   Ward, Chase Picket, PA-C  methocarbamol (ROBAXIN) 500 MG tablet Take 1 tablet (500 mg total) by mouth 2 (two) times daily as needed for muscle spasms. Patient not taking: Reported on 03/09/2023 11/06/16   Ward, Chase Picket, PA-C    Current Facility-Administered Medications  Medication Dose Route Frequency Provider Last Rate Last Admin   0.9 %  sodium chloride infusion (Manually program via Guardrails IV Fluids)   Intravenous Once Luiz Iron, NP       0.9 % NaCl with KCl 20 mEq/ L  infusion   Intravenous Continuous Mansy, Jan A, MD 100 mL/hr at 03/10/23 0351 New Bag at 03/10/23 0351   acetaminophen (TYLENOL) tablet 650 mg  650 mg Oral Q6H PRN Mansy, Jan A, MD       Or   acetaminophen (TYLENOL) suppository 650 mg  650 mg Rectal Q6H PRN Mansy, Jan A, MD       latanoprost (XALATAN) 0.005 % ophthalmic solution 1  drop  1 drop Both Eyes QHS Mansy, Jan A, MD   1 drop at 03/09/23 2300   magnesium hydroxide (MILK OF MAGNESIA) suspension 30 mL  30 mL Oral Daily PRN Mansy, Jan A, MD       morphine (PF) 2 MG/ML injection 2 mg  2 mg Intravenous Q4H PRN Mansy, Jan A, MD   2 mg at 03/09/23 2055   ondansetron (ZOFRAN) tablet 4 mg  4 mg Oral Q6H PRN Mansy, Jan A, MD       Or   ondansetron [facility]) injection 4 mg  4 mg Intravenous Q6H PRN Mansy, Jan A, MD       pantoprazole (PROTONIX)  injection 40 mg  40 mg Intravenous Q12H Mansy, Jan A, MD   40 mg at 03/10/23 0904   potassium chloride 10 mEq in 100 mL IVPB  10 mEq Intravenous Q1 Hr x 4 Rodolph Bong, MD 100 mL/hr at 03/10/23 0906 10 mEq at 03/10/23 0906   timolol (TIMOPTIC) 0.5 % ophthalmic solution 1 drop  1 drop Both Eyes Daily Mansy, Jan A, MD   1 drop at 03/10/23 0904   traZODone (DESYREL) tablet 25 mg  25 mg Oral QHS PRN Mansy, Jan A, MD        Allergies as of 03/09/2023   (No Known Allergies)    History reviewed. No pertinent family history.  Social History   Socioeconomic History   Marital status: Widowed    Spouse name: Not on file   Number of children: Not on file   Years of education: Not on file   Highest education level: Not on file  Occupational History   Not on file  Tobacco Use   Smoking status: Former   Smokeless tobacco: Not on file  Substance and Sexual Activity   Alcohol use: Yes    Comment: occasionally   Drug use: Yes    Types: Marijuana   Sexual activity: Not on file  Other Topics Concern   Not on file  Social History Narrative   Not on file   Social Determinants of Health   Financial Resource Strain: Not on file  Food Insecurity: No Food Insecurity (03/09/2023)   Hunger Vital Sign    Worried About Running Out of Food in the Last Year: Never true    Ran Out of Food in the Last Year: Never true  Transportation Needs: No Transportation Needs (03/09/2023)   PRAPARE - Administrator, Civil Service (Medical): No    Lack of Transportation (Non-Medical): No  Physical Activity: Not on file  Stress: Not on file  Social Connections: Not on file  Intimate Partner Violence: Not At Risk (03/09/2023)   Humiliation, Afraid, Rape, and Kick questionnaire    Fear of Current or Ex-Partner: No    Emotionally Abused: No    Physically Abused: No    Sexually Abused: No    Review of Systems: As per HPI  Physical Exam: Vital signs in last 24 hours: Temp:  [98 F (36.7  C)-99.2 F (37.3 C)] 98.7 F (37.1 C) (10/28 0457) Pulse Rate:  [63-81] 63 (10/28 0457) Resp:  [16-19] 19 (10/28 0457) BP: (134-160)/(79-98) 134/81 (10/28 0457) SpO2:  [100 %] 100 % (10/28 0457) Weight:  [74 kg] 74 kg (10/27 1035) Last BM Date : 03/08/23 (per patient)  General:   Alert,  Well-developed, well-nourished, pleasant and cooperative in NAD Head:  Normocephalic and atraumatic. Eyes:  Sclera clear, no icterus.  Prominent pallor Ears:  Normal auditory acuity. Nose:  No deformity, discharge,  or lesions. Mouth:  No deformity or lesions.  Oropharynx pink & moist. Neck:  Supple; no masses or thyromegaly. Lungs:  Clear throughout to auscultation.   No wheezes, crackles, or rhonchi. No acute distress. Heart:  Regular rate and rhythm; no murmurs, clicks, rubs,  or gallops. Extremities:  Without clubbing or edema. Neurologic:  Alert and  oriented x4;  grossly normal neurologically. Skin:  Intact without significant lesions or rashes. Psych:  Alert and cooperative. Normal mood and affect. Abdomen:  Soft, nontender and nondistended. No masses, hepatosplenomegaly or hernias noted. Normal bowel sounds, without guarding, and without rebound.         Lab Results: Recent Labs    03/09/23 1058 03/10/23 0425  WBC 6.7 6.6  HGB 7.7* 6.7*  HCT 26.8* 24.2*  PLT 389 348   BMET Recent Labs    03/09/23 1058 03/10/23 0425  NA 135 137  K 3.2* 3.4*  CL 103 108  CO2 19* 23  GLUCOSE 124* 112*  BUN 9 7*  CREATININE 0.80 0.82  CALCIUM 8.9 8.6*   LFT Recent Labs    03/09/23 1058  PROT 8.6*  ALBUMIN 3.4*  AST 15  ALT 11  ALKPHOS 59  BILITOT 0.5   PT/INR Recent Labs    03/09/23 1058  LABPROT 15.4*  INR 1.2    Studies/Results: CT Chest W Contrast  Result Date: 03/10/2023 CLINICAL DATA:  Respiratory illness EXAM: CT CHEST WITH CONTRAST TECHNIQUE: Multidetector CT imaging of the chest was performed during intravenous contrast administration. RADIATION DOSE REDUCTION:  This exam was performed according to the departmental dose-optimization program which includes automated exposure control, adjustment of the mA and/or kV according to patient size and/or use of iterative reconstruction technique. CONTRAST:  75mL OMNIPAQUE IOHEXOL 300 MG/ML  SOLN COMPARISON:  Chest x-ray 03/09/2023, CT 03/09/2023 FINDINGS: Cardiovascular: Mild aortic atherosclerosis. No aneurysm. Coronary vascular calcification. Normal cardiac size. No pericardial effusion Mediastinum/Nodes: Midline trachea. No thyroid mass. Borderline AP window lymph nodes measuring up to 10 mm. Right hilar node measuring 1.5 mm. Subcarinal lymph node measuring 16 mm. Multiple enlarged posterior mediastinal lymph nodes, measuring up to 14 mm. Lungs/Pleura: Mild emphysema. No acute airspace disease, pleural effusion or pneumothorax. 2 adjacent right lung base pulmonary nodules. Irregular 12 mm right lung base pulmonary nodule series 8, image 134, and adjacent 7 mm oval pulmonary nodule on series 8, image 132. No other suspicious pulmonary nodules. Upper Abdomen: See separately dictated abdominopelvic CT. Partially visualized retroperitoneal and upper abdominal adenopathy. Musculoskeletal: Heterogeneous sclerosis and lucency in the T1, T9 and T11 vertebral bodies with mild sclerosis at T10 and L1, consistent with skeletal metastatic disease. Mild loss of height of the T9 vertebral body. Associated right paravertebral soft tissue mass. Right paravertebral soft tissue mass also evident at T11, there may be epidural tissue at T11 as well. IMPRESSION: 1. No CT evidence for acute intrathoracic abnormality. 2. Mild emphysema. 2 adjacent right lung base pulmonary nodules measuring up to 12 mm, possible metastatic disease. 3. Mediastinal and right hilar adenopathy, suspicious for metastatic or lymphoproliferative disease. 4. Skeletal metastatic disease involving the T1, T9 and T11 vertebral bodies with mild loss of height of the T9 vertebral  body. Right paravertebral soft tissue mass at T9 and T11, there may be epidural extension of tissue at T11 as well. This may be correlated with dedicated thoracic MRI. 5. Partially visualized retroperitoneal and upper abdominal adenopathy. 6. Aortic atherosclerosis. Aortic Atherosclerosis (ICD10-I70.0) and  Emphysema (ICD10-J43.9). Electronically Signed   By: Jasmine Pang M.D.   On: 03/10/2023 01:49   CT ABDOMEN PELVIS W CONTRAST  Result Date: 03/09/2023 CLINICAL DATA:  Acute abdominal pain.  Vomiting. EXAM: CT ABDOMEN AND PELVIS WITH CONTRAST TECHNIQUE: Multidetector CT imaging of the abdomen and pelvis was performed using the standard protocol following bolus administration of intravenous contrast. RADIATION DOSE REDUCTION: This exam was performed according to the departmental dose-optimization program which includes automated exposure control, adjustment of the mA and/or kV according to patient size and/or use of iterative reconstruction technique. CONTRAST:  75mL OMNIPAQUE IOHEXOL 300 MG/ML  SOLN COMPARISON:  CT chest 12/07/2019, CT abdomen pelvis 09/09/2014. FINDINGS: Lower chest: 10 mm nodule in the RIGHT lower lobe (image 8) is new from lung cancer screening CT in 2021. Thickened paraspinal pleura in the medial RIGHT lower lobe (image 1) is abnormal. Hepatobiliary: No focal hepatic lesion. Normal gallbladder. No biliary duct dilatation. Common bile duct is normal. Pancreas: Pancreas is normal. No ductal dilatation. No pancreatic inflammation. Spleen: Normal spleen Adrenals/urinary tract: Adrenal glands and kidneys are normal. The ureters and bladder normal. Stomach/Bowel: Stomach, small bowel, appendix, and cecum are normal. The colon and rectosigmoid colon are normal. Vascular/Lymphatic: Enlarged lymph node LEFT aorta at the level the kidneys measures 24 mm on image 32. Aortocaval node at the same level measures 17 mm on image 30. Enlarged lymph nodes extend into the retrocrural space. For example 17 mm  retrocrural node on image 14. Large periportal lymph node measures 29 mm on image 20. No iliac lymphadenopathy. No inguinal lymphadenopathy. Reproductive: Prostate unremarkable Other: No free fluid. Musculoskeletal: Sclerotic changes in the lower thoracic spine new from comparison CT. Lytic lesion the posterior RIGHT aspect of the T11 vertebral body (image 10/301) is new from prior. IMPRESSION: 1. Upper abdominal lymphadenopathy and retrocrural adenopathy is consistent with malignancy. 2. New sclerotic and lytic changes in the thoracic spine consistent with metastatic disease. 3. Nodule in the RIGHT lower lobe is new from prior concerning for metastatic disease. 4. Differential would include lung cancer versus lymphoma in patient with smoking history. Electronically Signed   By: Genevive Bi M.D.   On: 03/09/2023 12:45   DG Chest 2 View  Result Date: 03/09/2023 CLINICAL DATA:  Shortness of breath.  Abdominal pain.  Vomiting. EXAM: CHEST - 2 VIEW COMPARISON:  None Available. FINDINGS: The heart size and mediastinal contours are within normal limits. Both lungs are clear. The visualized skeletal structures are unremarkable. IMPRESSION: No active cardiopulmonary disease. Electronically Signed   By: Signa Kell M.D.   On: 03/09/2023 12:28    Impression: Anemia, NSAID use, suspect upper GI bleeding Hemoglobin 6.7, MCV 68, likely related to iron deficiency  Unintentional weight loss, epigastric pain, low total protein of 8.6, low albumin of 3.4 CT abdomen pelvis with contrast: 10 mm right lower lobe nodule, normal liver, normal gallbladder, normal pancreas, normal stomach, small bowel, cecum, appendix, colon and rectosigmoid, enlarged left aortic lymph node, enlarged lymph node in retrocrural space, large periportal lymph node, sclerotic changes in lower thoracic spine consistent with metastatic disease, differential would include lung cancer versus lymphoma  CT chest: Mediastinal and right hilar  adenopathy suspicious for metastatic or lymphoproliferative disease, skeletal metastatic disease involving T1, T9 and T11, right paravertebral soft tissue mass  History of H. pylori associated gastritis, treated with antibiotics in 2005 as per patient    Plan: EGD tomorrow for further evaluation, has been started on pantoprazole 40 mg every 12 hours,  stable hemodynamics, 1 unit PRBC transfusion has been requested. Patient will need oncology consult for further evaluation of metastatic disease.   LOS: 1 day   Kerin Salen, MD  03/10/2023, 10:23 AM

## 2023-03-10 NOTE — Consult Note (Signed)
[facility] CONSULT NOTE  Patient Care Team: Patient, No Pcp Per as PCP - General (General Practice)  CHIEF COMPLAINTS/PURPOSE OF CONSULTATION:  Clinical suspicion of metastatic carcinoma  HISTORY OF PRESENTING ILLNESS:  62 year old with no previous medical problems admitted to the hospital with epigastric stabbing abdominal pain and black-colored stools.  He has been fatigued for the past 6 months and having 12 pound weight loss over the past 2 weeks.  He was found to have severe anemia with a hemoglobin of 6.7 and is receiving packed red cells.  CT of the chest abdomen pelvis revealed retroperitoneal lymphadenopathy as well as bone lesions suspicious for metastatic disease.  He also has 2 small lung nodules.  We are consulted to discuss management and workup of metastatic cancer.    I reviewed her records extensively and collaborated the history with the patient.  MEDICAL HISTORY:  Past Medical History:  Diagnosis Date   Glaucoma associated with anomalies of iris     SURGICAL HISTORY: History reviewed. No pertinent surgical history.  SOCIAL HISTORY: Social History   Socioeconomic History   Marital status: Widowed    Spouse name: Not on file   Number of children: Not on file   Years of education: Not on file   Highest education level: Not on file  Occupational History   Not on file  Tobacco Use   Smoking status: Former   Smokeless tobacco: Not on file  Substance and Sexual Activity   Alcohol use: Yes    Comment: occasionally   Drug use: Yes    Types: Marijuana   Sexual activity: Not on file  Other Topics Concern   Not on file  Social History Narrative   Not on file   Social Determinants of Health   Financial Resource Strain: Not on file  Food Insecurity: No Food Insecurity (03/09/2023)   Hunger Vital Sign    Worried About Running Out of Food in the Last Year: Never true    Ran Out of Food in the Last Year: Never true  Transportation Needs: No  Transportation Needs (03/09/2023)   PRAPARE - Administrator, Civil Service (Medical): No    Lack of Transportation (Non-Medical): No  Physical Activity: Not on file  Stress: Not on file  Social Connections: Not on file  Intimate Partner Violence: Not At Risk (03/09/2023)   Humiliation, Afraid, Rape, and Kick questionnaire    Fear of Current or Ex-Partner: No    Emotionally Abused: No    Physically Abused: No    Sexually Abused: No    FAMILY HISTORY: History reviewed. No pertinent family history.  ALLERGIES:  has No Known Allergies.  MEDICATIONS:  Current Facility-Administered Medications  Medication Dose Route Frequency Provider Last Rate Last Admin   0.9 %  sodium chloride infusion   Intravenous Continuous Kerin Salen, MD       0.9 % NaCl with KCl 20 mEq/ L  infusion   Intravenous Continuous Mansy, Jan A, MD   Stopped at 03/10/23 1159   acetaminophen (TYLENOL) tablet 650 mg  650 mg Oral Q6H PRN Mansy, Jan A, MD       Or   acetaminophen (TYLENOL) suppository 650 mg  650 mg Rectal Q6H PRN Mansy, Jan A, MD       latanoprost (XALATAN) 0.005 % ophthalmic solution 1 drop  1 drop Both Eyes QHS Mansy, Jan A, MD   1 drop at 03/09/23 2300   magnesium hydroxide (MILK OF MAGNESIA) suspension  30 mL  30 mL Oral Daily PRN Mansy, Jan A, MD       morphine (PF) 2 MG/ML injection 2 mg  2 mg Intravenous Q4H PRN Mansy, Jan A, MD   2 mg at 03/09/23 2055   ondansetron (ZOFRAN) tablet 4 mg  4 mg Oral Q6H PRN Mansy, Jan A, MD       Or   ondansetron [facility]) injection 4 mg  4 mg Intravenous Q6H PRN Mansy, Jan A, MD       pantoprazole (PROTONIX) injection 40 mg  40 mg Intravenous Q12H Mansy, Jan A, MD   40 mg at 03/10/23 0904   timolol (TIMOPTIC) 0.5 % ophthalmic solution 1 drop  1 drop Both Eyes Daily Mansy, Jan A, MD   1 drop at 03/10/23 0904   traZODone (DESYREL) tablet 25 mg  25 mg Oral QHS PRN Mansy, Jan A, MD        REVIEW OF SYSTEMS:   Constitutional: Denies fevers, chills or  abnormal night sweats   All other systems were reviewed with the patient and are negative.  PHYSICAL EXAMINATION: ECOG PERFORMANCE STATUS: 2 - Symptomatic, <50% confined to bed  Vitals:   03/10/23 1220 03/10/23 1309  BP: (!) 146/82 132/78  Pulse: 81 71  Resp: 18 20  Temp: 98.8 F (37.1 C) 98.5 F (36.9 C)  SpO2: 100% 100%   Filed Weights   03/09/23 1035  Weight: 163 lb 2.3 oz (74 kg)    GENERAL:alert, no distress and comfortable    LABORATORY DATA:  I have reviewed the data as listed Lab Results  Component Value Date   WBC 6.6 03/10/2023   HGB 6.7 (LL) 03/10/2023   HCT 24.2 (L) 03/10/2023   MCV 68.0 (L) 03/10/2023   PLT 348 03/10/2023   Lab Results  Component Value Date   NA 137 03/10/2023   K 3.4 (L) 03/10/2023   CL 108 03/10/2023   CO2 23 03/10/2023    RADIOGRAPHIC STUDIES: I have personally reviewed the radiological reports and agreed with the findings in the report.  ASSESSMENT AND PLAN:   1. Clinical suspicion of metastatic cancer: CT chest 03/10/2023: Mild emphysema.  2 adjacent right lower lobe lung nodules measuring 1.2 cm, mediastinal and right hilar adenopathy, skeletal metastasis T1, T9, T11 CT abdomen pelvis upper abdominal lymphadenopathy and retrocrural adenopathy, new sclerotic and lytic changes in the thoracic spine  Differential diagnosis: Metastatic carcinoma versus lymphoma. I requested Dr. Janee Morn to consult with interventional radiology for a biopsy. We will obtain PSA, SPEP for further evaluation  2. severe anemia: Suspicious for upper GI bleed: Endoscopy being planned for tomorrow.  Iron studies B12 folic acid are being obtained.  He received 1 unit of PRBC.  Will follow the patient once we have more test results.    All questions were answered. The patient knows to call the clinic with any problems, questions or concerns.    Tamsen Meek, MD @T @

## 2023-03-10 NOTE — Anesthesia Preprocedure Evaluation (Signed)
Anesthesia Evaluation  Patient identified by MRN, date of birth, ID band Patient awake    Reviewed: Allergy & Precautions, NPO status , Patient's Chart, lab work & pertinent test results  Airway Mallampati: II  TM Distance: >3 FB Neck ROM: Full    Dental  (+) Dental Advisory Given, Edentulous Upper, Upper Dentures, Missing   Pulmonary former smoker   Pulmonary exam normal breath sounds clear to auscultation       Cardiovascular negative cardio ROS Normal cardiovascular exam Rhythm:Regular Rate:Normal     Neuro/Psych negative neurological ROS     GI/Hepatic negative GI ROS, Neg liver ROS,,,  Endo/Other  negative endocrine ROS    Renal/GU negative Renal ROS     Musculoskeletal negative musculoskeletal ROS (+)    Abdominal   Peds  Hematology  (+) Blood dyscrasia, anemia   Anesthesia Other Findings   Reproductive/Obstetrics                             Anesthesia Physical Anesthesia Plan  ASA: 2  Anesthesia Plan: MAC   Post-op Pain Management:    Induction: Intravenous  PONV Risk Score and Plan: 2 and Propofol infusion, TIVA and Treatment may vary due to age or medical condition  Airway Management Planned:   Additional Equipment:   Intra-op Plan:   Post-operative Plan:   Informed Consent: I have reviewed the patients History and Physical, chart, labs and discussed the procedure including the risks, benefits and alternatives for the proposed anesthesia with the patient or authorized representative who has indicated his/her understanding and acceptance.     Dental advisory given  Plan Discussed with: CRNA  Anesthesia Plan Comments:        Anesthesia Quick Evaluation

## 2023-03-11 ENCOUNTER — Encounter (HOSPITAL_COMMUNITY): Payer: Self-pay | Admitting: Family Medicine

## 2023-03-11 ENCOUNTER — Encounter (HOSPITAL_COMMUNITY)
Admission: EM | Disposition: A | Discharge status: Home / Self Care | Payer: Self-pay | Source: Home / Self Care | Attending: Internal Medicine

## 2023-03-11 ENCOUNTER — Inpatient Hospital Stay (HOSPITAL_COMMUNITY): Payer: 59 | Admitting: Anesthesiology

## 2023-03-11 ENCOUNTER — Inpatient Hospital Stay (HOSPITAL_COMMUNITY): Payer: 59

## 2023-03-11 DIAGNOSIS — H4050X0 Glaucoma secondary to other eye disorders, unspecified eye, stage unspecified: Secondary | ICD-10-CM | POA: Diagnosis not present

## 2023-03-11 DIAGNOSIS — D649 Anemia, unspecified: Secondary | ICD-10-CM | POA: Diagnosis not present

## 2023-03-11 DIAGNOSIS — C799 Secondary malignant neoplasm of unspecified site: Secondary | ICD-10-CM | POA: Diagnosis not present

## 2023-03-11 DIAGNOSIS — K922 Gastrointestinal hemorrhage, unspecified: Secondary | ICD-10-CM | POA: Diagnosis not present

## 2023-03-11 HISTORY — PX: HOT HEMOSTASIS: SHX5433

## 2023-03-11 HISTORY — PX: ESOPHAGOGASTRODUODENOSCOPY (EGD) WITH PROPOFOL: SHX5813

## 2023-03-11 HISTORY — PX: BIOPSY: SHX5522

## 2023-03-11 LAB — CBC WITH DIFFERENTIAL/PLATELET
Abs Immature Granulocytes: 0.04 10*3/uL (ref 0.00–0.07)
Basophils Absolute: 0 10*3/uL (ref 0.0–0.1)
Basophils Relative: 0 %
Eosinophils Absolute: 0.1 10*3/uL (ref 0.0–0.5)
Eosinophils Relative: 1 %
HCT: 29 % — ABNORMAL LOW (ref 39.0–52.0)
Hemoglobin: 8.6 g/dL — ABNORMAL LOW (ref 13.0–17.0)
Immature Granulocytes: 1 %
Lymphocytes Relative: 15 %
Lymphs Abs: 1.1 10*3/uL (ref 0.7–4.0)
MCH: 20.9 pg — ABNORMAL LOW (ref 26.0–34.0)
MCHC: 29.7 g/dL — ABNORMAL LOW (ref 30.0–36.0)
MCV: 70.4 fL — ABNORMAL LOW (ref 80.0–100.0)
Monocytes Absolute: 0.5 10*3/uL (ref 0.1–1.0)
Monocytes Relative: 6 %
Neutro Abs: 5.5 10*3/uL (ref 1.7–7.7)
Neutrophils Relative %: 77 %
Platelets: 342 10*3/uL (ref 150–400)
RBC: 4.12 MIL/uL — ABNORMAL LOW (ref 4.22–5.81)
RDW: 23 % — ABNORMAL HIGH (ref 11.5–15.5)
WBC: 7.2 10*3/uL (ref 4.0–10.5)
nRBC: 0 % (ref 0.0–0.2)

## 2023-03-11 LAB — URINE CULTURE: Culture: NO GROWTH

## 2023-03-11 LAB — TYPE AND SCREEN
ABO/RH(D): O POS
Antibody Screen: NEGATIVE
Unit division: 0

## 2023-03-11 LAB — MAGNESIUM: Magnesium: 2.2 mg/dL (ref 1.7–2.4)

## 2023-03-11 LAB — RENAL FUNCTION PANEL
Albumin: 3.2 g/dL — ABNORMAL LOW (ref 3.5–5.0)
Anion gap: 11 (ref 5–15)
BUN: 8 mg/dL (ref 8–23)
CO2: 21 mmol/L — ABNORMAL LOW (ref 22–32)
Calcium: 9.1 mg/dL (ref 8.9–10.3)
Chloride: 108 mmol/L (ref 98–111)
Creatinine, Ser: 0.77 mg/dL (ref 0.61–1.24)
GFR, Estimated: >60 mL/min (ref >60)
Glucose, Bld: 112 mg/dL — ABNORMAL HIGH (ref 70–99)
Phosphorus: 4.4 mg/dL (ref 2.5–4.6)
Potassium: 3.6 mmol/L (ref 3.5–5.1)
Sodium: 140 mmol/L (ref 135–145)

## 2023-03-11 LAB — BPAM RBC
Blood Product Expiration Date: 202411262359
ISSUE DATE / TIME: 202410281144
Unit Type and Rh: 5100

## 2023-03-11 LAB — HEMOGLOBIN AND HEMATOCRIT, BLOOD
HCT: 29.3 % — ABNORMAL LOW (ref 39.0–52.0)
Hemoglobin: 8.4 g/dL — ABNORMAL LOW (ref 13.0–17.0)

## 2023-03-11 LAB — PROTIME-INR
INR: 1.2 (ref 0.8–1.2)
Prothrombin Time: 15.7 s — ABNORMAL HIGH (ref 11.4–15.2)

## 2023-03-11 LAB — PSA: Prostatic Specific Antigen: 0.65 ng/mL (ref 0.00–4.00)

## 2023-03-11 SURGERY — ESOPHAGOGASTRODUODENOSCOPY (EGD) WITH PROPOFOL
Anesthesia: Monitor Anesthesia Care

## 2023-03-11 MED ORDER — FLUCONAZOLE IN SODIUM CHLORIDE 200-0.9 MG/100ML-% IV SOLN
200.0000 mg | Freq: Every day | INTRAVENOUS | Status: DC
  Administered 2023-03-11 – 2023-03-12 (×2): 200 mg via INTRAVENOUS
  Filled 2023-03-11 (×2): qty 100

## 2023-03-11 MED ORDER — SODIUM CHLORIDE 0.9 % IV SOLN
INTRAVENOUS | Status: AC
  Filled 2023-03-11: qty 500

## 2023-03-11 MED ORDER — FENTANYL CITRATE (PF) 100 MCG/2ML IJ SOLN
INTRAMUSCULAR | Status: AC
  Filled 2023-03-11: qty 2

## 2023-03-11 MED ORDER — MIDAZOLAM HCL 2 MG/2ML IJ SOLN
INTRAMUSCULAR | Status: AC | PRN
Start: 2023-03-11 — End: 2023-03-11
  Administered 2023-03-11: 1 mg via INTRAVENOUS
  Administered 2023-03-11: .5 mg via INTRAVENOUS

## 2023-03-11 MED ORDER — PANTOPRAZOLE SODIUM 40 MG IV SOLR
40.0000 mg | INTRAVENOUS | Status: DC
  Administered 2023-03-12: 40 mg via INTRAVENOUS
  Filled 2023-03-11: qty 10

## 2023-03-11 MED ORDER — FENTANYL CITRATE (PF) 100 MCG/2ML IJ SOLN
INTRAMUSCULAR | Status: AC | PRN
Start: 2023-03-11 — End: 2023-03-11
  Administered 2023-03-11: 25 ug via INTRAVENOUS
  Administered 2023-03-11: 50 ug via INTRAVENOUS

## 2023-03-11 MED ORDER — LIDOCAINE HCL (CARDIAC) PF 100 MG/5ML IV SOSY
PREFILLED_SYRINGE | INTRAVENOUS | Status: DC | PRN
  Administered 2023-03-11: 50 mg via INTRAVENOUS

## 2023-03-11 MED ORDER — PROPOFOL 500 MG/50ML IV EMUL
INTRAVENOUS | Status: DC | PRN
  Administered 2023-03-11: 300 mg via INTRAVENOUS

## 2023-03-11 MED ORDER — POTASSIUM CHLORIDE CRYS ER 20 MEQ PO TBCR
20.0000 meq | EXTENDED_RELEASE_TABLET | Freq: Once | ORAL | Status: AC
  Administered 2023-03-11: 20 meq via ORAL
  Filled 2023-03-11: qty 1

## 2023-03-11 MED ORDER — SODIUM CHLORIDE 0.9 % IV SOLN: INTRAVENOUS | Status: DC | PRN

## 2023-03-11 MED ORDER — PROPOFOL 1000 MG/100ML IV EMUL
INTRAVENOUS | Status: AC
  Filled 2023-03-11: qty 100

## 2023-03-11 MED ORDER — MIDAZOLAM HCL 2 MG/2ML IJ SOLN
INTRAMUSCULAR | Status: AC
Start: 2023-03-11 — End: ?
  Filled 2023-03-11: qty 2

## 2023-03-11 SURGICAL SUPPLY — 15 items

## 2023-03-11 NOTE — Interval H&P Note (Signed)
History and Physical Interval Note: 62 year old male with melena, anemia, epigastric pain, unintentional weight loss and evidence of metastatic disease on imaging for an EGD with propofol.  03/11/2023 12:08 PM  Percell Kym Groom  has presented today for esophagogastroduodenoscopy with propofol with the diagnosis of Melena, anemia.  The various methods of treatment have been discussed with the patient and family. After consideration of risks, benefits and other options for treatment, the patient has consented to  Procedure(s): ESOPHAGOGASTRODUODENOSCOPY (EGD) WITH PROPOFOL (N/A) as a surgical intervention.  The patient's history has been reviewed, patient examined, no change in status, stable for surgery.  I have reviewed the patient's chart and labs.  Questions were answered to the patient's satisfaction.     Kerin Salen

## 2023-03-11 NOTE — Procedures (Signed)
Interventional Radiology Procedure Note  Procedure: CT guided left retroperitoneal lymph node biopsy  Findings: Please refer to procedural dictation for full description. 18 ga core x5 from left infrarenal retroperitoneal lymph node.  Samples split between saline and formalin.   Complications: None immediate  Estimated Blood Loss: < 5 ml  Recommendations: Strict 2 hour bedrest. Follow Pathology results.   Marliss Coots, MD

## 2023-03-11 NOTE — Transfer of Care (Signed)
Immediate Anesthesia Transfer of Care Note  Patient: Timothy Hunter  Procedure(s) Performed: ESOPHAGOGASTRODUODENOSCOPY (EGD) WITH PROPOFOL BIOPSY HOT HEMOSTASIS (ARGON PLASMA COAGULATION/BICAP)  Patient Location: PACU and Endoscopy Unit  Anesthesia Type:MAC  Level of Consciousness: awake and alert   Airway & Oxygen Therapy: Patient Spontanous Breathing and Patient connected to face mask oxygen  Post-op Assessment: Report given to RN and Post -op Vital signs reviewed and stable  Post vital signs: Reviewed and stable  Last Vitals:  Vitals Value Taken Time  BP 105/57 03/11/23 1246  Temp    Pulse 79 03/11/23 1247  Resp 15 03/11/23 1247  SpO2 100 % 03/11/23 1247  Vitals shown include unfiled device data.  Last Pain:  Vitals:   03/11/23 1143  TempSrc: Temporal  PainSc: 0-No pain      Patients Stated Pain Goal: 0 (03/09/23 2054)  Complications: No notable events documented.

## 2023-03-11 NOTE — Consult Note (Signed)
Chief Complaint: Patient was seen in consultation today for retroperitoneal LN biopsy   Referring Physician(s): Dr. Ramiro Harvest  Supervising Physician: Marliss Coots  Patient Status: [facility] - In-pt  History of Present Illness: Timothy Hunter is a 62 y.o. male admitted with abdominal and anemia. GI has seen pt and plans endoscopy however imaging workup also finds multiple pulm nodules and adenopathy concerning for metastatic process. Oncology consulted as well. IR is asked to review imaging for biopsy.  PMHx, meds, labs, imaging, allergies reviewed. Feels okay today. Has been NPO today as directed. Family at bedside.   Past Medical History:  Diagnosis Date   Glaucoma associated with anomalies of iris     History reviewed. No pertinent surgical history.  Allergies: Patient has no known allergies.  Medications:  Current Facility-Administered Medications:    0.9 %  sodium chloride infusion, , Intravenous, Continuous, Kerin Salen, MD, Last Rate: 20 mL/hr at 03/11/23 0600, Infusion Verify at 03/11/23 0600   acetaminophen (TYLENOL) tablet 650 mg, 650 mg, Oral, Q6H PRN **OR** acetaminophen (TYLENOL) suppository 650 mg, 650 mg, Rectal, Q6H PRN, Mansy, Jan A, MD   latanoprost (XALATAN) 0.005 % ophthalmic solution 1 drop, 1 drop, Both Eyes, QHS, Mansy, Jan A, MD, 1 drop at 03/10/23 2126   magnesium hydroxide (MILK OF MAGNESIA) suspension 30 mL, 30 mL, Oral, Daily PRN, Mansy, Jan A, MD   morphine (PF) 2 MG/ML injection 2 mg, 2 mg, Intravenous, Q4H PRN, Mansy, Jan A, MD, 2 mg at 03/09/23 2055   ondansetron (ZOFRAN) tablet 4 mg, 4 mg, Oral, Q6H PRN **OR** ondansetron (ZOFRAN) injection 4 mg, 4 mg, Intravenous, Q6H PRN, Mansy, Jan A, MD   pantoprazole (PROTONIX) injection 40 mg, 40 mg, Intravenous, Q12H, Mansy, Jan A, MD, 40 mg at 03/11/23 8119   potassium chloride SA (KLOR-CON M) CR tablet 20 mEq, 20 mEq, Oral, Once, Rodolph Bong, MD   timolol (TIMOPTIC) 0.5 %  ophthalmic solution 1 drop, 1 drop, Both Eyes, Daily, Mansy, Jan A, MD, 1 drop at 03/11/23 0926   traZODone (DESYREL) tablet 25 mg, 25 mg, Oral, QHS PRN, Mansy, Vernetta Honey, MD    History reviewed. No pertinent family history.  Social History   Socioeconomic History   Marital status: Widowed    Spouse name: Not on file   Number of children: Not on file   Years of education: Not on file   Highest education level: Not on file  Occupational History   Not on file  Tobacco Use   Smoking status: Former   Smokeless tobacco: Not on file  Substance and Sexual Activity   Alcohol use: Yes    Comment: occasionally   Drug use: Yes    Types: Marijuana   Sexual activity: Not on file  Other Topics Concern   Not on file  Social History Narrative   Not on file   Social Determinants of Health   Financial Resource Strain: Not on file  Food Insecurity: No Food Insecurity (03/09/2023)   Hunger Vital Sign    Worried About Running Out of Food in the Last Year: Never true    Ran Out of Food in the Last Year: Never true  Transportation Needs: No Transportation Needs (03/09/2023)   PRAPARE - Administrator, Civil Service (Medical): No    Lack of Transportation (Non-Medical): No  Physical Activity: Not on file  Stress: Not on file  Social Connections: Not on file    Review of Systems: A 12  point ROS discussed and pertinent positives are indicated in the HPI above.  All other systems are negative.  Review of Systems  Vital Signs: BP 118/74 (BP Location: Right Arm)   Pulse 71   Temp 98.1 F (36.7 C) (Oral)   Resp 17   Ht 5\' 9"  (1.753 m)   Wt 163 lb 2.3 oz (74 kg)   SpO2 100%   BMI 24.09 kg/m   Physical Exam Constitutional:      Appearance: He is not ill-appearing.  HENT:     Mouth/Throat:     Mouth: Mucous membranes are moist.     Pharynx: Oropharynx is clear.  Cardiovascular:     Rate and Rhythm: Normal rate and regular rhythm.     Heart sounds: Normal heart sounds.   Pulmonary:     Effort: Pulmonary effort is normal. No respiratory distress.     Breath sounds: Normal breath sounds.  Abdominal:     General: There is no distension.     Palpations: Abdomen is soft.     Tenderness: There is no abdominal tenderness.  Skin:    General: Skin is warm and dry.  Neurological:     General: No focal deficit present.     Mental Status: He is alert and oriented to person, place, and time.  Psychiatric:        Mood and Affect: Mood normal.        Thought Content: Thought content normal.     Imaging: CT Chest W Contrast  Result Date: 03/10/2023 CLINICAL DATA:  Respiratory illness EXAM: CT CHEST WITH CONTRAST TECHNIQUE: Multidetector CT imaging of the chest was performed during intravenous contrast administration. RADIATION DOSE REDUCTION: This exam was performed according to the departmental dose-optimization program which includes automated exposure control, adjustment of the mA and/or kV according to patient size and/or use of iterative reconstruction technique. CONTRAST:  75mL OMNIPAQUE IOHEXOL 300 MG/ML  SOLN COMPARISON:  Chest x-ray 03/09/2023, CT 03/09/2023 FINDINGS: Cardiovascular: Mild aortic atherosclerosis. No aneurysm. Coronary vascular calcification. Normal cardiac size. No pericardial effusion Mediastinum/Nodes: Midline trachea. No thyroid mass. Borderline AP window lymph nodes measuring up to 10 mm. Right hilar node measuring 1.5 mm. Subcarinal lymph node measuring 16 mm. Multiple enlarged posterior mediastinal lymph nodes, measuring up to 14 mm. Lungs/Pleura: Mild emphysema. No acute airspace disease, pleural effusion or pneumothorax. 2 adjacent right lung base pulmonary nodules. Irregular 12 mm right lung base pulmonary nodule series 8, image 134, and adjacent 7 mm oval pulmonary nodule on series 8, image 132. No other suspicious pulmonary nodules. Upper Abdomen: See separately dictated abdominopelvic CT. Partially visualized retroperitoneal and upper  abdominal adenopathy. Musculoskeletal: Heterogeneous sclerosis and lucency in the T1, T9 and T11 vertebral bodies with mild sclerosis at T10 and L1, consistent with skeletal metastatic disease. Mild loss of height of the T9 vertebral body. Associated right paravertebral soft tissue mass. Right paravertebral soft tissue mass also evident at T11, there may be epidural tissue at T11 as well. IMPRESSION: 1. No CT evidence for acute intrathoracic abnormality. 2. Mild emphysema. 2 adjacent right lung base pulmonary nodules measuring up to 12 mm, possible metastatic disease. 3. Mediastinal and right hilar adenopathy, suspicious for metastatic or lymphoproliferative disease. 4. Skeletal metastatic disease involving the T1, T9 and T11 vertebral bodies with mild loss of height of the T9 vertebral body. Right paravertebral soft tissue mass at T9 and T11, there may be epidural extension of tissue at T11 as well. This may be correlated with dedicated thoracic  MRI. 5. Partially visualized retroperitoneal and upper abdominal adenopathy. 6. Aortic atherosclerosis. Aortic Atherosclerosis (ICD10-I70.0) and Emphysema (ICD10-J43.9). Electronically Signed   By: Jasmine Pang M.D.   On: 03/10/2023 01:49   CT ABDOMEN PELVIS W CONTRAST  Result Date: 03/09/2023 CLINICAL DATA:  Acute abdominal pain.  Vomiting. EXAM: CT ABDOMEN AND PELVIS WITH CONTRAST TECHNIQUE: Multidetector CT imaging of the abdomen and pelvis was performed using the standard protocol following bolus administration of intravenous contrast. RADIATION DOSE REDUCTION: This exam was performed according to the departmental dose-optimization program which includes automated exposure control, adjustment of the mA and/or kV according to patient size and/or use of iterative reconstruction technique. CONTRAST:  75mL OMNIPAQUE IOHEXOL 300 MG/ML  SOLN COMPARISON:  CT chest 12/07/2019, CT abdomen pelvis 09/09/2014. FINDINGS: Lower chest: 10 mm nodule in the RIGHT lower lobe (image  8) is new from lung cancer screening CT in 2021. Thickened paraspinal pleura in the medial RIGHT lower lobe (image 1) is abnormal. Hepatobiliary: No focal hepatic lesion. Normal gallbladder. No biliary duct dilatation. Common bile duct is normal. Pancreas: Pancreas is normal. No ductal dilatation. No pancreatic inflammation. Spleen: Normal spleen Adrenals/urinary tract: Adrenal glands and kidneys are normal. The ureters and bladder normal. Stomach/Bowel: Stomach, small bowel, appendix, and cecum are normal. The colon and rectosigmoid colon are normal. Vascular/Lymphatic: Enlarged lymph node LEFT aorta at the level the kidneys measures 24 mm on image 32. Aortocaval node at the same level measures 17 mm on image 30. Enlarged lymph nodes extend into the retrocrural space. For example 17 mm retrocrural node on image 14. Large periportal lymph node measures 29 mm on image 20. No iliac lymphadenopathy. No inguinal lymphadenopathy. Reproductive: Prostate unremarkable Other: No free fluid. Musculoskeletal: Sclerotic changes in the lower thoracic spine new from comparison CT. Lytic lesion the posterior RIGHT aspect of the T11 vertebral body (image 10/301) is new from prior. IMPRESSION: 1. Upper abdominal lymphadenopathy and retrocrural adenopathy is consistent with malignancy. 2. New sclerotic and lytic changes in the thoracic spine consistent with metastatic disease. 3. Nodule in the RIGHT lower lobe is new from prior concerning for metastatic disease. 4. Differential would include lung cancer versus lymphoma in patient with smoking history. Electronically Signed   By: Genevive Bi M.D.   On: 03/09/2023 12:45   DG Chest 2 View  Result Date: 03/09/2023 CLINICAL DATA:  Shortness of breath.  Abdominal pain.  Vomiting. EXAM: CHEST - 2 VIEW COMPARISON:  None Available. FINDINGS: The heart size and mediastinal contours are within normal limits. Both lungs are clear. The visualized skeletal structures are unremarkable.  IMPRESSION: No active cardiopulmonary disease. Electronically Signed   By: Signa Kell M.D.   On: 03/09/2023 12:28    Labs:  CBC: Recent Labs    03/09/23 1058 03/10/23 0425 03/11/23 0428  WBC 6.7 6.6 7.2  HGB 7.7* 6.7* 8.6*  HCT 26.8* 24.2* 29.0*  PLT 389 348 342    COAGS: Recent Labs    03/09/23 1058 03/11/23 0428  INR 1.2 1.2    BMP: Recent Labs    03/09/23 1058 03/10/23 0425 03/11/23 0428  NA 135 137 140  K 3.2* 3.4* 3.6  CL 103 108 108  CO2 19* 23 21*  GLUCOSE 124* 112* 112*  BUN 9 7* 8  CALCIUM 8.9 8.6* 9.1  CREATININE 0.80 0.82 0.77  GFRNONAA >60 >60 >60    LIVER FUNCTION TESTS: Recent Labs    03/09/23 1058 03/11/23 0428  BILITOT 0.5  --   AST 15  --  ALT 11  --   ALKPHOS 59  --   PROT 8.6*  --   ALBUMIN 3.4* 3.2*     Assessment and Plan: Retroperitoneal adenopathy Plan for CT guided biopsy Labs reviewed, Hgb up to 8.6 For endoscopy this afternoon. Will try to do biopsy after. Risks and benefits of retroperitoneal LN bx was discussed with the patient and/or patient's family including, but not limited to bleeding, infection, damage to adjacent structures or low yield requiring additional tests.  All of the questions were answered and there is agreement to proceed.  Consent signed and in chart.    Electronically Signed: Brayton El, PA-C 03/11/2023, 10:48 AM   I spent a total of 30 minutes in face to face in clinical consultation, greater than 50% of which was counseling/coordinating care for RP LN bx

## 2023-03-11 NOTE — Progress Notes (Signed)
PROGRESS NOTE    [name]  ZOX:[id number] DOB: 08/03/1960 DOA: 03/09/2023 PCP: Timothy Hunter, No Pcp Per    Chief Complaint  Timothy Hunter presents with   Abdominal Pain    Brief Narrative:  HPI per Dr. Darrel Reach is a 62 y.o. African-American male with medical history significant for glaucoma, who presented to the emergency room with acute onset of abdominal pain over the last couple months mainly in the epigastric area with associated nausea and vomiting that started yesterday.  He admitted to mild chills without reported fever.  No chest pain or palpitations.  No cough or wheezing or dyspnea.  No dysuria, oliguria or hematuria or flank pain.  No melena or bright red being per rectum.  No other bleeding diathesis.  He has been having low back pain lately.   ED Course: Upon presentation to the emergency room BP was 147/82 with otherwise normal vital signs.  Labs revealed mild hypokalemia of 3.2 and a CO2 of 19 albumin of 3.4 and troponin 8.6 with otherwise unremarkable CMP.  CBC showed anemia with hemoglobin 7.7 hematocrit 26.8.No recent values for comparison.  His last H&H were 15 and 44 in 2010.  Stool Hemoccult came back positive. EKG as reviewed by me : EKG showed normal sinus rhythm with a rate of 79. Imaging: Two-view chest x-ray showed no acute cardiopulmonary disease. Abdominal and pelvic CT scan with contrast revealed the following: 1. Upper abdominal lymphadenopathy and retrocrural adenopathy is consistent with malignancy. 2. New sclerotic and lytic changes in the thoracic spine consistent with metastatic disease. 3. Nodule in the RIGHT lower lobe is new from prior concerning for metastatic disease. 4. Differential would include lung cancer versus lymphoma in Timothy Hunter with smoking history. Chest CT with contrast report is currently pending.   The Timothy Hunter was given a gram of IV Rocephin, 30 mL p.o. Maalox, 20 mg of IV Pepcid, 4 mg of IV Zofran, 40 mg of IV Protonix  and 1 L bolus of IV normal saline.  Contact was made with [facility] who accepted to see the Timothy Hunter in consultation.  The Timothy Hunter was directly admitted to a progressive unit bed for further evaluation and management.  Timothy Hunter transfused a unit of PRBCs as hemoglobin dropped to 6.7.  Timothy Hunter seen in consultation by GI and Timothy Hunter for upper endoscopy 03/11/2023.  Oncology consulted due to concern for metastatic disease noted on this CT imaging.  IR evaluation/consult placed to assess for biopsy.  Assessment & Plan:   Principal Problem:   Metastatic disease (HCC) Active Problems:   GI bleeding   Glaucoma   Glaucoma associated with anomalies of iris   Symptomatic anemia   Acute blood loss anemia   Abnormal CT scan   Upper GI bleed  #1 GI bleed/acute blood loss anemia/microcytic anemia/low vitamin B12 levels -Timothy Hunter pleasant 62 year old gentleman, prior history of tobacco use, presenting to the ED with a several months history of worsening shortness of breath on exertion, epigastric abdominal pain, recent NSAID use.  Prior history of H. pylori gastritis treated per Timothy Hunter. -FOBT done on admission was positive. -Hemoglobin on admission was 7.7 with last hemoglobin noted at 15.0 03/13/2009 per epic.  MCV of 68.0. -Anemia panel with iron level of 28 TIBC of 328 ferritin of 18 folate of 8.6.   -Vitamin B12 233.   -Hemoglobin currently at 8.6 from 6.7 status post transfusion 1 unit PRBCs. -Continue IV PPI every 12 hours. -Timothy Hunter seen in consultation by GI and Timothy Hunter for upper  endoscopy for further evaluation today.  -Follow H&H. -Transfusion threshold hemoglobin < 7.  2.  Abnormal CT chest/CT abdomen and pelvis/??  Metastatic disease -CT chest with emphysema, pulmonary nodules noted concerning for metastatic disease, mediastinal and right hilar adenopathy suspicious for metastatic or lymphoproliferative disease, skeletal metastatic disease involving T1, T9, T11 vertebral bodies with mild loss  of height of T9.  Right paravertebral soft tissue mass at T9 and 11. -CT abdomen and pelvis with upper abdominal lymphadenopathy and retrocrural adenopathy consistent with malignancy.  New sclerotic and lytic changes in the thoracic spine consistent with metastatic disease.  Nodule in the right lower lobe new for prior concerning for metastatic disease. -Timothy Hunter is seen in consultation by oncology who have reviewed the chart and imaging and have a clinical suspicion for metastatic cancer and recommended IR evaluation for biopsy.  Also recommended PSA and SPEP. -PSA noted at 0.65.  Multiple myeloma panel pending. -IR consultation pending for evaluation for biopsy.   -Oncology following and appreciate input and recommendations.   3.  Hypokalemia -Magnesium at 2.2 today.   -Potassium at 3.6.   4.  Glaucoma -Continue home eyedrops.       DVT prophylaxis: SCDs Code Status: Full Family Communication: Updated Timothy Hunter.  No family at bedside. Disposition: Home once clinically improved and cleared by oncology and GI.  Status is: Inpatient Remains inpatient appropriate because: Severity of illness   Consultants:  Gastroenterology: Dr. Marca Ancona 03/10/2023 Oncology: Dr. Pamelia Hoit 03/10/2023  Procedures:  Transfusion 1 unit PRBCs 03/10/2023 CT chest 03/10/2023 CT abdomen and pelvis 03/09/2023 Chest x-ray 03/09/2023  Antimicrobials:  Anti-infectives (From admission, onward)    Start     Dose/Rate Route Frequency Ordered Stop   03/09/23 1445  cefTRIAXone (ROCEPHIN) 1 g in sodium chloride 0.9 % 100 mL IVPB        1 g 200 mL/hr over 30 Minutes Intravenous  Once 03/09/23 1434 03/10/23 0344         Subjective: Timothy Hunter laying in bed.  Denies any chest pain.  No shortness of breath at rest however stated prior to admission was short of breath on minimal exertion.  Denies any further epigastric pain.  No nausea or emesis.  No black or tarry stools, no bright red blood per rectum.  Awaiting upper  endoscopy today.    Objective: Vitals:   03/10/23 1309 03/10/23 1545 03/10/23 1949 03/11/23 0432  BP: 132/78 (!) 145/84 (!) 146/78 118/74  Pulse: 71 71 83 71  Resp: 20  19 17   Temp: 98.5 F (36.9 C) 98.6 F (37 C) 98.4 F (36.9 C) 98.1 F (36.7 C)  TempSrc: Oral Oral Oral Oral  SpO2: 100% 100% 100% 100%  Weight:      Height:        Intake/Output Summary (Last 24 hours) at 03/11/2023 0926 Last data filed at 03/11/2023 0600 Gross per 24 hour  Intake 997.65 ml  Output 1200 ml  Net -202.35 ml   Filed Weights   03/09/23 1035  Weight: 74 kg    Examination:  General exam: NAD Respiratory system: Lungs clear to auscultation bilaterally.  No wheezes, no crackles, no rhonchi.  Fair air movement.  Speaking in full sentences.   Cardiovascular system: Regular rate rhythm no murmurs rubs or gallops.  No JVD.  No lower extremity edema.  Gastrointestinal system: Abdomen is soft, nontender, nondistended, positive bowel sounds.  No rebound.  No guarding.  Central nervous system: Alert and oriented. No focal neurological deficits. Extremities: Symmetric 5 x  5 power. Skin: No rashes, lesions or ulcers Psychiatry: Judgement and insight appear normal. Mood & affect appropriate.     Data Reviewed: I have personally reviewed following labs and imaging studies  CBC: Recent Labs  Lab 03/09/23 1058 03/10/23 0425 03/11/23 0428  WBC 6.7 6.6 7.2  NEUTROABS 5.2  --  5.5  HGB 7.7* 6.7* 8.6*  HCT 26.8* 24.2* 29.0*  MCV 66.0* 68.0* 70.4*  PLT 389 348 342    Basic Metabolic Panel: Recent Labs  Lab 03/09/23 1058 03/10/23 0425 03/10/23 1455 03/11/23 0428  NA 135 137  --  140  K 3.2* 3.4*  --  3.6  CL 103 108  --  108  CO2 19* 23  --  21*  GLUCOSE 124* 112*  --  112*  BUN 9 7*  --  8  CREATININE 0.80 0.82  --  0.77  CALCIUM 8.9 8.6*  --  9.1  MG  --   --  2.4 2.2  PHOS  --   --   --  4.4    GFR: Estimated Creatinine Clearance: 95.7 mL/min (by C-G formula based on SCr of 0.77  mg/dL).  Liver Function Tests: Recent Labs  Lab 03/09/23 1058 03/11/23 0428  AST 15  --   ALT 11  --   ALKPHOS 59  --   BILITOT 0.5  --   PROT 8.6*  --   ALBUMIN 3.4* 3.2*    CBG: No results for input(s): "GLUCAP" in the last 168 hours.   Recent Results (from the past 240 hour(s))  Culture, blood (routine x 2)     Status: None (Preliminary result)   Collection Time: 03/09/23  2:45 PM   Specimen: BLOOD  Result Value Ref Range Status   Specimen Description   Final    BLOOD LEFT ANTECUBITAL Performed at [facility], [address] [place], [place] [address]    Special Requests   Final    BOTTLES DRAWN AEROBIC AND ANAEROBIC Blood Culture adequate volume Performed at [facility], [address] [place], [place] [address]    Culture   Final    NO GROWTH < 12 HOURS Performed at [facility], [address] [place], [place] [address]    Report Status PENDING  Incomplete  Culture, blood (routine x 2)     Status: None (Preliminary result)   Collection Time: 03/09/23  6:53 PM   Specimen: BLOOD  Result Value Ref Range Status   Specimen Description   Final    BLOOD BLOOD RIGHT ARM Performed at [facility], [address], [place], [place] [address]    Special Requests   Final    Blood Culture adequate volume BOTTLES DRAWN AEROBIC AND ANAEROBIC Performed at [facility], [address], [place], [place] [address]    Culture   Final    NO GROWTH < 12 HOURS Performed at [facility], [address] [place], [place] [address]    Report Status PENDING  Incomplete         Radiology Studies: CT Chest W Contrast  Result Date: 03/10/2023 CLINICAL DATA:  Respiratory illness EXAM: CT CHEST WITH CONTRAST TECHNIQUE: Multidetector CT imaging of the chest was performed during intravenous contrast administration. RADIATION DOSE REDUCTION: This exam was performed according to the departmental  dose-optimization program which includes automated exposure control, adjustment of the mA and/or kV according to Timothy Hunter size and/or use of iterative  reconstruction technique. CONTRAST:  75mL OMNIPAQUE IOHEXOL 300 MG/ML  SOLN COMPARISON:  Chest x-ray 03/09/2023, CT 03/09/2023 FINDINGS: Cardiovascular: Mild aortic atherosclerosis. No aneurysm. Coronary vascular calcification. Normal cardiac size. No pericardial effusion Mediastinum/Nodes: Midline trachea. No thyroid mass. Borderline AP window lymph nodes measuring up to 10 mm. Right hilar node measuring 1.5 mm. Subcarinal lymph node measuring 16 mm. Multiple enlarged posterior mediastinal lymph nodes, measuring up to 14 mm. Lungs/Pleura: Mild emphysema. No acute airspace disease, pleural effusion or pneumothorax. 2 adjacent right lung base pulmonary nodules. Irregular 12 mm right lung base pulmonary nodule series 8, image 134, and adjacent 7 mm oval pulmonary nodule on series 8, image 132. No other suspicious pulmonary nodules. Upper Abdomen: See separately dictated abdominopelvic CT. Partially visualized retroperitoneal and upper abdominal adenopathy. Musculoskeletal: Heterogeneous sclerosis and lucency in the T1, T9 and T11 vertebral bodies with mild sclerosis at T10 and L1, consistent with skeletal metastatic disease. Mild loss of height of the T9 vertebral body. Associated right paravertebral soft tissue mass. Right paravertebral soft tissue mass also evident at T11, there may be epidural tissue at T11 as well. IMPRESSION: 1. No CT evidence for acute intrathoracic abnormality. 2. Mild emphysema. 2 adjacent right lung base pulmonary nodules measuring up to 12 mm, possible metastatic disease. 3. Mediastinal and right hilar adenopathy, suspicious for metastatic or lymphoproliferative disease. 4. Skeletal metastatic disease involving the T1, T9 and T11 vertebral bodies with mild loss of height of the T9 vertebral body. Right paravertebral soft tissue mass at T9 and  T11, there may be epidural extension of tissue at T11 as well. This may be correlated with dedicated thoracic MRI. 5. Partially visualized retroperitoneal and upper abdominal adenopathy. 6. Aortic atherosclerosis. Aortic Atherosclerosis (ICD10-I70.0) and Emphysema (ICD10-J43.9). Electronically Signed   By: Jasmine Pang M.D.   On: 03/10/2023 01:49   CT ABDOMEN PELVIS W CONTRAST  Result Date: 03/09/2023 CLINICAL DATA:  Acute abdominal pain.  Vomiting. EXAM: CT ABDOMEN AND PELVIS WITH CONTRAST TECHNIQUE: Multidetector CT imaging of the abdomen and pelvis was performed using the standard protocol following bolus administration of intravenous contrast. RADIATION DOSE REDUCTION: This exam was performed according to the departmental dose-optimization program which includes automated exposure control, adjustment of the mA and/or kV according to Timothy Hunter size and/or use of iterative reconstruction technique. CONTRAST:  75mL OMNIPAQUE IOHEXOL 300 MG/ML  SOLN COMPARISON:  CT chest 12/07/2019, CT abdomen pelvis 09/09/2014. FINDINGS: Lower chest: 10 mm nodule in the RIGHT lower lobe (image 8) is new from lung cancer screening CT in 2021. Thickened paraspinal pleura in the medial RIGHT lower lobe (image 1) is abnormal. Hepatobiliary: No focal hepatic lesion. Normal gallbladder. No biliary duct dilatation. Common bile duct is normal. Pancreas: Pancreas is normal. No ductal dilatation. No pancreatic inflammation. Spleen: Normal spleen Adrenals/urinary tract: Adrenal glands and kidneys are normal. The ureters and bladder normal. Stomach/Bowel: Stomach, small bowel, appendix, and cecum are normal. The colon and rectosigmoid colon are normal. Vascular/Lymphatic: Enlarged lymph node LEFT aorta at the level the kidneys measures 24 mm on image 32. Aortocaval node at the same level measures 17 mm on image 30. Enlarged lymph nodes extend into the retrocrural space. For example 17 mm retrocrural node on image 14. Large periportal lymph  node measures 29 mm on image 20. No iliac lymphadenopathy. No inguinal lymphadenopathy. Reproductive: Prostate unremarkable Other: No free fluid. Musculoskeletal: Sclerotic changes in the lower thoracic spine new from comparison CT. Lytic lesion the posterior RIGHT aspect of the T11 vertebral body (image 10/301)  is new from prior. IMPRESSION: 1. Upper abdominal lymphadenopathy and retrocrural adenopathy is consistent with malignancy. 2. New sclerotic and lytic changes in the thoracic spine consistent with metastatic disease. 3. Nodule in the RIGHT lower lobe is new from prior concerning for metastatic disease. 4. Differential would include lung cancer versus lymphoma in Timothy Hunter with smoking history. Electronically Signed   By: Genevive Bi M.D.   On: 03/09/2023 12:45   DG Chest 2 View  Result Date: 03/09/2023 CLINICAL DATA:  Shortness of breath.  Abdominal pain.  Vomiting. EXAM: CHEST - 2 VIEW COMPARISON:  None Available. FINDINGS: The heart size and mediastinal contours are within normal limits. Both lungs are clear. The visualized skeletal structures are unremarkable. IMPRESSION: No active cardiopulmonary disease. Electronically Signed   By: Signa Kell M.D.   On: 03/09/2023 12:28        Scheduled Meds:  latanoprost  1 drop Both Eyes QHS   pantoprazole (PROTONIX) IV  40 mg Intravenous Q12H   potassium chloride  20 mEq Oral Once   timolol  1 drop Both Eyes Daily   Continuous Infusions:  sodium chloride 20 mL/hr at 03/11/23 0600     LOS: 2 days    Time spent: 40 minutes    Ramiro Harvest, MD [facility]   To contact the attending provider between 7A-7P or the covering provider during after hours 7P-7A, please log into the web site [email or web address] and access using universal [place] password for that web site. If you do not have the password, please call the hospital operator.  03/11/2023, 9:26 AM

## 2023-03-11 NOTE — Op Note (Signed)
[facility] Patient Name: Timothy Hunter Procedure Date: 03/11/2023 MRN: [id number] Attending MD: Kerin Salen , MD, [id number] Date of Birth: 02-07-1961 CSN: [id number] Age: 62 Admit Type: Inpatient Procedure:                Upper GI endoscopy Indications:              Epigastric abdominal pain, Iron deficiency anemia                            secondary to chronic blood loss, Melena, Abnormal                            CT of the GI tract concerning of metastatic                            disease, Weight loss, history of H pylori associted                            gastritis Providers:                Kerin Salen, MD, Suzy Bouchard, RN, Marja Kays,                            Technician Referring MD:             [facility] Medicines:                Monitored Anesthesia Care Complications:            No immediate complications. Estimated blood loss:                            Minimal. Estimated Blood Loss:     Estimated blood loss was minimal. Procedure:                Pre-Anesthesia Assessment:                           - Prior to the procedure, a History and Physical                            was performed, and patient medications and                            allergies were reviewed. The patient's tolerance of                            previous anesthesia was also reviewed. The risks                            and benefits of the procedure and the sedation                            options and risks were discussed with the patient.                            All questions  were answered, and informed consent                            was obtained. Prior Anticoagulants: The patient has                            taken no anticoagulant or antiplatelet agents. ASA                            Grade Assessment: II - A patient with mild systemic                            disease. After reviewing the risks and benefits,                            the  patient was deemed in satisfactory condition to                            undergo the procedure.                           After obtaining informed consent, the endoscope was                            passed under direct vision. Throughout the                            procedure, the patient's blood pressure, pulse, and                            oxygen saturations were monitored continuously. The                            [id number]-[id number] ([id number]) Olympus endoscope was introduced                            through the mouth, and advanced to the second part                            of duodenum. The upper GI endoscopy was                            accomplished without difficulty. The patient                            tolerated the procedure well. Scope In: Scope Out: Findings:      Localized mild mucosal changes characterized by scarring were found in       the gastric antrum. Biopsies were taken with a cold forceps for       Helicobacter pylori testing.      The cardia and gastric fundus were normal on retroflexion.      Two 3 mm angioectasias with typical arborization were found in the first       portion of the duodenum and in the second portion  of the duodenum.       Coagulation for tissue destruction using argon plasma at 0.5       liters/minute and 20 watts was successful.      Patchy, white plaques were found in the middle third of the esophagus       and in the lower third of the esophagus. Biopsies were taken with a cold       forceps for histology. Impression:               - Scarred mucosa in the antrum. Biopsied.                           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 Treated with                            argon plasma coagulation (APC).                           - Esophageal plaques were found, suspicious for                            candidiasis. Biopsied. Moderate Sedation:      Patient did not receive moderate sedation for this procedure, but       instead  received monitored anesthesia care. Recommendation:           - Resume regular diet.                           - Continue present medications.                           - Await pathology results.                           (Rectal exam showed yellow/brown stools). Procedure Code(s):        --- Professional ---                           [phone], Esophagogastroduodenoscopy, flexible,                            transoral; with ablation of tumor(s), polyp(s), or                            other lesion(s) (includes pre- and post-dilation                            and guide wire passage, when performed)                           [id number], 59, Esophagogastroduodenoscopy, flexible,                            transoral; with biopsy, single or multiple Diagnosis Code(s):        --- Professional ---  K31.89, Other diseases of stomach and duodenum                           K31.819, Angiodysplasia of stomach and duodenum                            without bleeding                           K22.9, Disease of esophagus, unspecified                           R10.13, Epigastric pain                           D50.0, Iron deficiency anemia secondary to blood                            loss (chronic)                           K92.1, Melena (includes Hematochezia)                           R63.4, Abnormal weight loss                           R93.3, Abnormal findings on diagnostic imaging of                            other parts of digestive tract CPT copyright 2022 [employer]. All rights reserved. The codes documented in this report are preliminary and upon coder review may  be revised to meet current compliance requirements. Kerin Salen, MD 03/11/2023 12:46:16 PM This report has been signed electronically. Number of Addenda: 0

## 2023-03-12 DIAGNOSIS — D649 Anemia, unspecified: Secondary | ICD-10-CM | POA: Diagnosis not present

## 2023-03-12 DIAGNOSIS — R9389 Abnormal findings on diagnostic imaging of other specified body structures: Secondary | ICD-10-CM | POA: Diagnosis not present

## 2023-03-12 DIAGNOSIS — C799 Secondary malignant neoplasm of unspecified site: Secondary | ICD-10-CM | POA: Diagnosis not present

## 2023-03-12 DIAGNOSIS — K922 Gastrointestinal hemorrhage, unspecified: Secondary | ICD-10-CM | POA: Diagnosis not present

## 2023-03-12 LAB — BASIC METABOLIC PANEL
Anion gap: 10 (ref 5–15)
BUN: 10 mg/dL (ref 8–23)
CO2: 20 mmol/L — ABNORMAL LOW (ref 22–32)
Calcium: 8.8 mg/dL — ABNORMAL LOW (ref 8.9–10.3)
Chloride: 107 mmol/L (ref 98–111)
Creatinine, Ser: 0.65 mg/dL (ref 0.61–1.24)
GFR, Estimated: >60 mL/min (ref >60)
Glucose, Bld: 113 mg/dL — ABNORMAL HIGH (ref 70–99)
Potassium: 3.5 mmol/L (ref 3.5–5.1)
Sodium: 137 mmol/L (ref 135–145)

## 2023-03-12 LAB — CBC
HCT: 29.5 % — ABNORMAL LOW (ref 39.0–52.0)
Hemoglobin: 8.3 g/dL — ABNORMAL LOW (ref 13.0–17.0)
MCH: 20.3 pg — ABNORMAL LOW (ref 26.0–34.0)
MCHC: 28.1 g/dL — ABNORMAL LOW (ref 30.0–36.0)
MCV: 72.3 fL — ABNORMAL LOW (ref 80.0–100.0)
Platelets: 324 10*3/uL (ref 150–400)
RBC: 4.08 MIL/uL — ABNORMAL LOW (ref 4.22–5.81)
RDW: 23.6 % — ABNORMAL HIGH (ref 11.5–15.5)
WBC: 6.6 10*3/uL (ref 4.0–10.5)
nRBC: 0 % (ref 0.0–0.2)

## 2023-03-12 LAB — MAGNESIUM: Magnesium: 2.1 mg/dL (ref 1.7–2.4)

## 2023-03-12 MED ORDER — ENSURE ENLIVE PO LIQD
237.0000 mL | Freq: Two times a day (BID) | ORAL | Status: DC
  Administered 2023-03-12: 237 mL via ORAL

## 2023-03-12 MED ORDER — TRAMADOL HCL 50 MG PO TABS: 50.0000 mg | ORAL_TABLET | Freq: Four times a day (QID) | ORAL | 0 refills | Status: AC | PRN

## 2023-03-12 MED ORDER — ENSURE ENLIVE PO LIQD: 237.0000 mL | Freq: Two times a day (BID) | ORAL | 12 refills | Status: AC

## 2023-03-12 NOTE — Anesthesia Postprocedure Evaluation (Signed)
Anesthesia Post Note  Patient: COLESON SHOPE  Procedure(s) Performed: ESOPHAGOGASTRODUODENOSCOPY (EGD) WITH PROPOFOL BIOPSY HOT HEMOSTASIS (ARGON PLASMA COAGULATION/BICAP)     Patient location during evaluation: Endoscopy Anesthesia Type: MAC Level of consciousness: awake and alert Pain management: pain level controlled Vital Signs Assessment: post-procedure vital signs reviewed and stable Respiratory status: spontaneous breathing Cardiovascular status: stable Anesthetic complications: no   No notable events documented.  Last Vitals:  Vitals:   03/11/23 2015 03/12/23 0517  BP: 133/87 138/81  Pulse: 74 68  Resp: 18 19  Temp: 36.9 C 36.8 C  SpO2: 100% 100%    Last Pain:  Vitals:   03/12/23 0727  TempSrc:   PainSc: 8                  Lewie Loron

## 2023-03-12 NOTE — Progress Notes (Signed)
Subjective: Patient reports back pain at the site of IR guided biopsy yesterday. Denies further episodes of black stools. Denies abdominal pain.  Objective: Vital signs in last 24 hours: Temp:  [97.4 F (36.3 C)-98.8 F (37.1 C)] 98.2 F (36.8 C) (10/30 0517) Pulse Rate:  [55-81] 68 (10/30 0517) Resp:  [11-24] 19 (10/30 0517) BP: (102-155)/(49-92) 138/81 (10/30 0517) SpO2:  [99 %-100 %] 100 % (10/30 0517) Weight change:  Last BM Date : 03/11/23  PE: Mild pallor GENERAL: Not in distress, able to speak in full sentences  ABDOMEN: Soft, nondistended, nontender, normoactive bowel sounds EXTREMITIES: No deformity  Lab Results: Results for orders placed or performed during the hospital encounter of 03/09/23 (from the past 48 hour(s))  Vitamin B12     Status: None   Collection Time: 03/10/23  2:55 PM  Result Value Ref Range   Vitamin B-12 233 180 - 914 pg/mL    Comment: (NOTE) This assay is not validated for testing neonatal or myeloproliferative syndrome specimens for Vitamin B12 levels. Performed at [facility], [address], [place], [place] [address]   Folate     Status: None   Collection Time: 03/10/23  2:55 PM  Result Value Ref Range   Folate 8.6 >5.9 ng/mL    Comment: Performed at [facility], [address], [place], [place] [address]  Iron and TIBC     Status: Abnormal   Collection Time: 03/10/23  2:55 PM  Result Value Ref Range   Iron 28 (L) 45 - 182 ug/dL   TIBC 098 119 - 147 ug/dL   Saturation Ratios 9 (L) 17.9 - 39.5 %   UIBC 300 ug/dL    Comment: Performed at [facility], [address] [place], [place] [address]  Ferritin     Status: Abnormal   Collection Time: 03/10/23  2:55 PM  Result Value Ref Range   Ferritin 18 (L) 24 - 336 ng/mL    Comment: Performed at [facility], [address], [place], [place] [address]  Magnesium     Status: None   Collection Time: 03/10/23   2:55 PM  Result Value Ref Range   Magnesium 2.4 1.7 - 2.4 mg/dL    Comment: Performed at [facility], [address], [place], [place] [address]  PSA     Status: None   Collection Time: 03/11/23  4:28 AM  Result Value Ref Range   Prostatic Specific Antigen 0.65 0.00 - 4.00 ng/mL    Comment: (NOTE) While PSA levels of <=4.00 ng/ml are reported as reference range, some men with levels below 4.00 ng/ml can have prostate cancer and many men with PSA above 4.00 ng/ml do not have prostate cancer.  Other tests such as free PSA, age specific reference ranges, PSA velocity and PSA doubling time may be helpful especially in men less than 13 years old. Performed at [facility], [address], [place], [place] [address]   CBC with Differential/Platelet     Status: Abnormal   Collection Time: 03/11/23  4:28 AM  Result Value Ref Range   WBC 7.2 4.0 - 10.5 K/uL   RBC 4.12 (L) 4.22 - 5.81 MIL/uL   Hemoglobin 8.6 (L) 13.0 - 17.0 g/dL    Comment: POST TRANSFUSION SPECIMEN Reticulocyte Hemoglobin testing may be clinically indicated, consider ordering this additional test [id number]    HCT 29.0 (L) 39.0 - 52.0 %   MCV 70.4 (L) 80.0 -  100.0 fL   MCH 20.9 (L) 26.0 - 34.0 pg   MCHC 29.7 (L) 30.0 - 36.0 g/dL   RDW 16.1 (H) 09.6 - 04.5 %   Platelets 342 150 - 400 K/uL   nRBC 0.0 0.0 - 0.2 %   Neutrophils Relative % 77 %   Neutro Abs 5.5 1.7 - 7.7 K/uL   Lymphocytes Relative 15 %   Lymphs Abs 1.1 0.7 - 4.0 K/uL   Monocytes Relative 6 %   Monocytes Absolute 0.5 0.1 - 1.0 K/uL   Eosinophils Relative 1 %   Eosinophils Absolute 0.1 0.0 - 0.5 K/uL   Basophils Relative 0 %   Basophils Absolute 0.0 0.0 - 0.1 K/uL   Immature Granulocytes 1 %   Abs Immature Granulocytes 0.04 0.00 - 0.07 K/uL   Polychromasia PRESENT    Target Cells PRESENT     Comment: Performed at [facility], [address] [place], [place] [address]  Renal function panel      Status: Abnormal   Collection Time: 03/11/23  4:28 AM  Result Value Ref Range   Sodium 140 135 - 145 mmol/L   Potassium 3.6 3.5 - 5.1 mmol/L   Chloride 108 98 - 111 mmol/L   CO2 21 (L) 22 - 32 mmol/L   Glucose, Bld 112 (H) 70 - 99 mg/dL    Comment: Glucose reference range applies only to samples taken after fasting for at least 8 hours.   BUN 8 8 - 23 mg/dL   Creatinine, Ser 1.91 0.61 - 1.24 mg/dL   Calcium 9.1 8.9 - 47.8 mg/dL   Phosphorus 4.4 2.5 - 4.6 mg/dL   Albumin 3.2 (L) 3.5 - 5.0 g/dL   GFR, Estimated >29 >56 mL/min    Comment: (NOTE) Calculated using the CKD-EPI Creatinine Equation (2021)    Anion gap 11 5 - 15    Comment: Performed at [facility], [address] [place], [place] [address]  Magnesium     Status: None   Collection Time: 03/11/23  4:28 AM  Result Value Ref Range   Magnesium 2.2 1.7 - 2.4 mg/dL    Comment: Performed at [facility], [address] [place], [place] [address]  Protime-INR     Status: Abnormal   Collection Time: 03/11/23  4:28 AM  Result Value Ref Range   Prothrombin Time 15.7 (H) 11.4 - 15.2 seconds   INR 1.2 0.8 - 1.2    Comment: (NOTE) INR goal varies based on device and disease states. Performed at [facility], [address] [place], [place] [address]   Surgical pathology     Status: None   Collection Time: 03/11/23 12:30 PM  Result Value Ref Range   SURGICAL PATHOLOGY      SURGICAL PATHOLOGY CASE: [id number] PATIENT: Casimer Leek Surgical Pathology Report     Clinical History: Melena, anemia (crm)     FINAL MICROSCOPIC DIAGNOSIS:  A. STOMACH, ANTRUM, BIOPSY: -  Antral mucosa with extensive intestinal metaplasia consistent with atrophy -  Negative for dysplasia. -  An immunohistochemical stain for Helicobacter pylori organisms is pending and will be reported in an addendum.  B. ESOPHAGUS, BIOPSY: -  Squamous mucosa with no significant  pathology.    GROSS DESCRIPTION:  A: Received in formalin are tan, soft tissue fragments that are submitted in toto. Number: 2 size: 0.3 and 0.4 cm blocks: 1  B: Received in formalin are tan, soft tissue fragments that are submitted  in toto. Number: 3 size: Range from 0.2 to 0.4 cm blocks: 1 (KL 03/11/2023)    Final Diagnosis performed by Orene Desanctis DO.   Electronically signed 03/12/2023 Technical and / or Professional components performed at [facility],  [address], [place], [place] [address].  Immunohistochemistry Technical component (if applicable) was performed at [facility]. [address], [place], [place] [address].   IMMUNOHISTOCHEMISTRY DISCLAIMER (if applicable): Some of these immunohistochemical stains may have been developed and the performance characteristics determine by [facility]. Some may not have been cleared or approved by the U.S. Food and Drug Administration. The FDA has determined that such clearance or approval is not necessary. This test is used for clinical purposes. It should not be regarded as investigational or for research. This laboratory is certified under the Clinical Laboratory Improvement Amendments of 1988 (CLIA-88) as qualified to perform high complexity clinical laboratory testing.  The controls stained appropriately.   IHC stains are performed on formalin fixed, paraffin embedded tissue using a 3,3"diaminobenzidine (DAB) chromogen and [employer]. The staining intensity of the nucleus is score manually and is reported as the percentage of tumor cell nuclei demonstrating specific nuclear staining. The specimens are fixed in 10% Neutral Formalin for at least 6 hours and up to 72hrs. These tests are validated on decalcified tissue. Results should be interpreted with caution given the possibility of false negative results on decalcified specimens. Antibody  Clones are as follows ER-clone 30F, PR-clone 16, Ki67- clone MM1. Some of these immunohistochemical stains may have been developed and the performance characteristics determined by [facility].   Hemoglobin and hematocrit, blood     Status: Abnormal   Collection Time: 03/11/23  2:22 PM  Result Value Ref Range   Hemoglobin 8.4 (L) 13.0 - 17.0 g/dL   HCT 29.5 (L) 62.1 - 30.8 %    Comment: Performed at [facility], [address], [place], [place] [address]  CBC     Status: Abnormal   Collection Time: 03/12/23  4:16 AM  Result Value Ref Range   WBC 6.6 4.0 - 10.5 K/uL   RBC 4.08 (L) 4.22 - 5.81 MIL/uL   Hemoglobin 8.3 (L) 13.0 - 17.0 g/dL    Comment: Reticulocyte Hemoglobin testing may be clinically indicated, consider ordering this additional test [id number]    HCT 29.5 (L) 39.0 - 52.0 %   MCV 72.3 (L) 80.0 - 100.0 fL   MCH 20.3 (L) 26.0 - 34.0 pg   MCHC 28.1 (L) 30.0 - 36.0 g/dL   RDW 41.3 (H) 24.4 - 01.0 %   Platelets 324 150 - 400 K/uL   nRBC 0.0 0.0 - 0.2 %    Comment: Performed at [facility], [address], [place], [place] [address]  Basic metabolic panel     Status: Abnormal   Collection Time: 03/12/23  4:16 AM  Result Value Ref Range   Sodium 137 135 - 145 mmol/L   Potassium 3.5 3.5 - 5.1 mmol/L   Chloride 107 98 - 111 mmol/L   CO2 20 (L) 22 - 32 mmol/L   Glucose, Bld 113 (H) 70 - 99 mg/dL    Comment: Glucose reference range applies only to samples taken after fasting for at least 8 hours.   BUN 10 8 - 23 mg/dL   Creatinine, Ser 6.64 0.61 - 1.24 mg/dL   Calcium 8.8 (L) 8.9 - 10.3 mg/dL   GFR, Estimated >  60 >60 mL/min    Comment: (NOTE) Calculated using the CKD-EPI Creatinine Equation (2021)    Anion gap 10 5 - 15    Comment: Performed at [facility], [address] [place], [place] [address]  Magnesium     Status: None   Collection Time: 03/12/23  4:16 AM  Result Value Ref Range   Magnesium  2.1 1.7 - 2.4 mg/dL    Comment: Performed at [facility], [address], [place], [place] [address]    Studies/Results: CT BIOPSY  Result Date: 03/11/2023 INDICATION: 62 year old male with indeterminate retroperitoneal lymphadenopathy. EXAM: CT BIOPSY COMPARISON:  None Available. MEDICATIONS: None. ANESTHESIA/SEDATION: Fentanyl 75 mcg IV; Versed 1.5 mg IV Sedation time: 14 minutes; The patient was continuously monitored during the procedure by the interventional radiology nurse under my direct supervision. CONTRAST:  None. COMPLICATIONS: None immediate. PROCEDURE: RADIATION DOSE REDUCTION: This exam was performed according to the departmental dose-optimization program which includes automated exposure control, adjustment of the mA and/or kV according to patient size and/or use of iterative reconstruction technique. Informed consent was obtained from the patient following an explanation of the procedure, risks, benefits and alternatives. A time out was performed prior to the initiation of the procedure. The patient was positioned prone on the CT table and a limited CT was performed for procedural planning demonstrating multifocal retroperitoneal lymphadenopathy. A left infrarenal prominent lymph node was targeted for biopsy. The procedure was planned. The operative site was prepped and draped in the usual sterile fashion. Appropriate trajectory was confirmed with a 22 gauge spinal needle after the adjacent tissues were anesthetized with 1% Lidocaine with epinephrine. Under intermittent CT guidance, a 17 gauge coaxial needle was advanced into the peripheral aspect of the mass. Appropriate positioning was confirmed and a total of 5 samples were obtained with an 18 gauge core needle biopsy device. Specimens were split between formalin and saline. The co-axial needle was removed and hemostasis was achieved with manual compression. A limited postprocedural CT was negative for hemorrhage or  additional complication. A dressing was placed. The patient tolerated the procedure well without immediate postprocedural complication. IMPRESSION: Technically successful CT guided core needle biopsy of infrarenal left retroperitoneal lymph node. Marliss Coots, MD [facility] Electronically Signed   By: Marliss Coots M.D.   On: 03/11/2023 16:48    Medications: I have reviewed the patient's current medications.  Assessment: Duodenal AVM treated with APC Antral biopsy showed extensive intestinal metaplasia consistent with atrophy, H. pylori stain and results pending No evidence of Candida on esophageal biopsy  Hemoglobin stable post 1 unit PRBC transfusion, admitted with 6.7, subsequent hemoglobin 8.6/8.4/8.3 Slightly low iron saturation of 9% and ferritin of 18 with almost normal TIBC  Status post IR guided biopsy of left retroperitoneal infrarenal lymph node  Upper abdominal lymphadenopathy and retrocrural adenopathy consistent with malignancy with new sclerotic and lytic lesion in thoracic spine consistent with metastatic disease, nodule in right lower lobe concerning for metastatic disease, lung cancer versus lymphoma  Plan: He is tolerating regular diet. Immunohistochemical stain for H. pylori is pending, if positive, will recommend treatment. No evidence of Candida on biopsy, I am not sure who ordered fluconazole, this can be can discontinued from GI standpoint. Continue pantoprazole 40 mg once a day for the next 1 month. GI will sign off, further management as per oncology and primary team.  Kerin Salen, MD 03/12/2023, 10:16 AM

## 2023-03-12 NOTE — Plan of Care (Signed)
  Problem: Safety: Goal: Ability to remain free from injury will improve Outcome: Progressing   Problem: Skin Integrity: Goal: Risk for impaired skin integrity will decrease Outcome: Progressing   

## 2023-03-13 ENCOUNTER — Telehealth: Payer: Self-pay | Admitting: Hematology and Oncology

## 2023-03-13 LAB — SURGICAL PATHOLOGY

## 2023-03-13 NOTE — Discharge Summary (Signed)
Physician Discharge Summary   Patient: Timothy Hunter MRN: [id number] DOB: 1960/08/14  Admit date:     03/09/2023  Discharge date: 03/12/2023  Discharge Physician: Kathlen Mody   PCP: Patient, No Pcp Per   Recommendations at discharge:  Please follow up with PCP in one week.    Discharge Diagnoses: Principal Problem:   Metastatic disease (HCC) Active Problems:   GI bleeding   Glaucoma   Glaucoma associated with anomalies of iris   Symptomatic anemia   Acute blood loss anemia   Abnormal CT scan   Upper GI bleed    Hospital Course: AYDIEN Hunter is a 62 y.o. African-American male with medical history significant for glaucoma, who presented to the emergency room with acute onset of abdominal pain over the last couple months mainly in the epigastric area with associated nausea and vomiting   Assessment and Plan: GI bleed/acute blood loss anemia/microcytic anemia/low vitamin B12 levels -Patient pleasant 62 year old gentleman, prior history of tobacco use, presenting to the ED with a several months history of worsening shortness of breath on exertion, epigastric abdominal pain, recent NSAID use.  Prior history of H. pylori gastritis treated per patient. -FOBT done on admission was positive. -Hemoglobin on admission was 7.7 with last hemoglobin noted at 15.0 03/13/2009 per epic.  MCV of 68.0. -Anemia panel with iron level of 28 TIBC of 328 ferritin of 18 folate of 8.6.   -Vitamin B12 233.   -Hemoglobin currently at 8.6 from 6.7 status post transfusion 1 unit PRBCs. -GI consulted and EGD done. He was cleared for discharge and recommended to follow up with GI for biopsies.    2.  Abnormal CT chest/CT abdomen and pelvis/??  Metastatic disease -CT chest with emphysema, pulmonary nodules noted concerning for metastatic disease, mediastinal and right hilar adenopathy suspicious for metastatic or lymphoproliferative disease, skeletal metastatic disease involving T1, T9, T11  vertebral bodies with mild loss of height of T9.  Right paravertebral soft tissue mass at T9 and 11. -CT abdomen and pelvis with upper abdominal lymphadenopathy and retrocrural adenopathy consistent with malignancy.  New sclerotic and lytic changes in the thoracic spine consistent with metastatic disease.  Nodule in the right lower lobe new for prior concerning for metastatic disease. -Patient is seen in consultation by oncology who have reviewed the chart and imaging and have a clinical suspicion for metastatic cancer and recommended IR evaluation for biopsy.  Also recommended PSA and SPEP. -PSA noted at 0.65.  Multiple myeloma panel pending. -IR consultation  done and he underwent biopsy.  Recommend outpatient follow up with oncology to discuss the biopsy results.    3.  Hypokalemia Replaced.    4.  Glaucoma -Continue home eyedrops.   Consultants: oncology GI Procedures performed: EGD Disposition: Home Diet recommendation:  Discharge Diet Orders (From admission, onward)     Start     Ordered   03/12/23 0000  Diet - low sodium heart healthy        03/12/23 1227           Regular diet DISCHARGE MEDICATION: Allergies as of 03/12/2023   No Known Allergies      Medication List     STOP taking these medications    HYDROcodone-acetaminophen 5-325 MG tablet Commonly known as: NORCO/VICODIN   ibuprofen 200 MG tablet Commonly known as: ADVIL   methocarbamol 500 MG tablet Commonly known as: ROBAXIN       TAKE these medications    feeding supplement Liqd Take 237 mLs  by mouth 2 (two) times daily between meals.   latanoprost 0.005 % ophthalmic solution Commonly known as: XALATAN Place 1 drop into both eyes at bedtime.   pantoprazole 40 MG tablet Commonly known as: PROTONIX Take 40 mg by mouth daily before breakfast.   timolol 0.5 % ophthalmic solution Commonly known as: TIMOPTIC Place 1 drop into both eyes in the morning.   traMADol 50 MG tablet Commonly  known as: Ultram Take 1 tablet (50 mg total) by mouth every 6 (six) hours as needed for up to 8 days.        Discharge Exam: Filed Weights   03/09/23 1035  Weight: 74 kg   General exam: Appears calm and comfortable  Respiratory system: Clear to auscultation. Respiratory effort normal. Cardiovascular system: S1 & S2 heard, RRR.  Gastrointestinal system: Abdomen is nondistended, soft and nontender. Central nervous system: Alert and oriented. No focal neurological deficits. Extremities: Symmetric 5 x 5 power. Skin: No rashes,  Psychiatry:  Mood & affect appropriate.     Condition at discharge: fair  The results of significant diagnostics from this hospitalization (including imaging, microbiology, ancillary and laboratory) are listed below for reference.   Imaging Studies: CT BIOPSY  Result Date: 2023/03/18 INDICATION: 62 year old male with indeterminate retroperitoneal lymphadenopathy. EXAM: CT BIOPSY COMPARISON:  None Available. MEDICATIONS: None. ANESTHESIA/SEDATION: Fentanyl 75 mcg IV; Versed 1.5 mg IV Sedation time: 14 minutes; The patient was continuously monitored during the procedure by the interventional radiology nurse under my direct supervision. CONTRAST:  None. COMPLICATIONS: None immediate. PROCEDURE: RADIATION DOSE REDUCTION: This exam was performed according to the departmental dose-optimization program which includes automated exposure control, adjustment of the mA and/or kV according to patient size and/or use of iterative reconstruction technique. Informed consent was obtained from the patient following an explanation of the procedure, risks, benefits and alternatives. A time out was performed prior to the initiation of the procedure. The patient was positioned prone on the CT table and a limited CT was performed for procedural planning demonstrating multifocal retroperitoneal lymphadenopathy. A left infrarenal prominent lymph node was targeted for biopsy. The procedure  was planned. The operative site was prepped and draped in the usual sterile fashion. Appropriate trajectory was confirmed with a 22 gauge spinal needle after the adjacent tissues were anesthetized with 1% Lidocaine with epinephrine. Under intermittent CT guidance, a 17 gauge coaxial needle was advanced into the peripheral aspect of the mass. Appropriate positioning was confirmed and a total of 5 samples were obtained with an 18 gauge core needle biopsy device. Specimens were split between formalin and saline. The co-axial needle was removed and hemostasis was achieved with manual compression. A limited postprocedural CT was negative for hemorrhage or additional complication. A dressing was placed. The patient tolerated the procedure well without immediate postprocedural complication. IMPRESSION: Technically successful CT guided core needle biopsy of infrarenal left retroperitoneal lymph node. Marliss Coots, MD [facility] Electronically Signed   By: Marliss Coots M.D.   On: Mar 18, 2023 16:48   CT Chest W Contrast  Result Date: 03/10/2023 CLINICAL DATA:  Respiratory illness EXAM: CT CHEST WITH CONTRAST TECHNIQUE: Multidetector CT imaging of the chest was performed during intravenous contrast administration. RADIATION DOSE REDUCTION: This exam was performed according to the departmental dose-optimization program which includes automated exposure control, adjustment of the mA and/or kV according to patient size and/or use of iterative reconstruction technique. CONTRAST:  75mL OMNIPAQUE IOHEXOL 300 MG/ML  SOLN COMPARISON:  Chest x-ray 03/09/2023, CT 03/09/2023  FINDINGS: Cardiovascular: Mild aortic atherosclerosis. No aneurysm. Coronary vascular calcification. Normal cardiac size. No pericardial effusion Mediastinum/Nodes: Midline trachea. No thyroid mass. Borderline AP window lymph nodes measuring up to 10 mm. Right hilar node measuring 1.5 mm. Subcarinal  lymph node measuring 16 mm. Multiple enlarged posterior mediastinal lymph nodes, measuring up to 14 mm. Lungs/Pleura: Mild emphysema. No acute airspace disease, pleural effusion or pneumothorax. 2 adjacent right lung base pulmonary nodules. Irregular 12 mm right lung base pulmonary nodule series 8, image 134, and adjacent 7 mm oval pulmonary nodule on series 8, image 132. No other suspicious pulmonary nodules. Upper Abdomen: See separately dictated abdominopelvic CT. Partially visualized retroperitoneal and upper abdominal adenopathy. Musculoskeletal: Heterogeneous sclerosis and lucency in the T1, T9 and T11 vertebral bodies with mild sclerosis at T10 and L1, consistent with skeletal metastatic disease. Mild loss of height of the T9 vertebral body. Associated right paravertebral soft tissue mass. Right paravertebral soft tissue mass also evident at T11, there may be epidural tissue at T11 as well. IMPRESSION: 1. No CT evidence for acute intrathoracic abnormality. 2. Mild emphysema. 2 adjacent right lung base pulmonary nodules measuring up to 12 mm, possible metastatic disease. 3. Mediastinal and right hilar adenopathy, suspicious for metastatic or lymphoproliferative disease. 4. Skeletal metastatic disease involving the T1, T9 and T11 vertebral bodies with mild loss of height of the T9 vertebral body. Right paravertebral soft tissue mass at T9 and T11, there may be epidural extension of tissue at T11 as well. This may be correlated with dedicated thoracic MRI. 5. Partially visualized retroperitoneal and upper abdominal adenopathy. 6. Aortic atherosclerosis. Aortic Atherosclerosis (ICD10-I70.0) and Emphysema (ICD10-J43.9). Electronically Signed   By: Jasmine Pang M.D.   On: 03/10/2023 01:49   CT ABDOMEN PELVIS W CONTRAST  Result Date: 03/09/2023 CLINICAL DATA:  Acute abdominal pain.  Vomiting. EXAM: CT ABDOMEN AND PELVIS WITH CONTRAST TECHNIQUE: Multidetector CT imaging of the abdomen and pelvis was performed  using the standard protocol following bolus administration of intravenous contrast. RADIATION DOSE REDUCTION: This exam was performed according to the departmental dose-optimization program which includes automated exposure control, adjustment of the mA and/or kV according to patient size and/or use of iterative reconstruction technique. CONTRAST:  75mL OMNIPAQUE IOHEXOL 300 MG/ML  SOLN COMPARISON:  CT chest 12/07/2019, CT abdomen pelvis 09/09/2014. FINDINGS: Lower chest: 10 mm nodule in the RIGHT lower lobe (image 8) is new from lung cancer screening CT in 2021. Thickened paraspinal pleura in the medial RIGHT lower lobe (image 1) is abnormal. Hepatobiliary: No focal hepatic lesion. Normal gallbladder. No biliary duct dilatation. Common bile duct is normal. Pancreas: Pancreas is normal. No ductal dilatation. No pancreatic inflammation. Spleen: Normal spleen Adrenals/urinary tract: Adrenal glands and kidneys are normal. The ureters and bladder normal. Stomach/Bowel: Stomach, small bowel, appendix, and cecum are normal. The colon and rectosigmoid colon are normal. Vascular/Lymphatic: Enlarged lymph node LEFT aorta at the level the kidneys measures 24 mm on image 32. Aortocaval node at the same level measures 17 mm on image 30. Enlarged lymph nodes extend into the retrocrural space. For example 17 mm retrocrural node on image 14. Large periportal lymph node measures 29 mm on image 20. No iliac lymphadenopathy. No inguinal lymphadenopathy. Reproductive: Prostate unremarkable Other: No free fluid. Musculoskeletal: Sclerotic changes in the lower thoracic spine new from comparison CT. Lytic lesion the posterior RIGHT aspect of the T11 vertebral body (image 10/301) is new from prior. IMPRESSION: 1. Upper abdominal lymphadenopathy and retrocrural adenopathy is consistent with malignancy. 2. New  sclerotic and lytic changes in the thoracic spine consistent with metastatic disease. 3. Nodule in the RIGHT lower lobe is new from  prior concerning for metastatic disease. 4. Differential would include lung cancer versus lymphoma in patient with smoking history. Electronically Signed   By: Genevive Bi M.D.   On: 03/09/2023 12:45   DG Chest 2 View  Result Date: 03/09/2023 CLINICAL DATA:  Shortness of breath.  Abdominal pain.  Vomiting. EXAM: CHEST - 2 VIEW COMPARISON:  None Available. FINDINGS: The heart size and mediastinal contours are within normal limits. Both lungs are clear. The visualized skeletal structures are unremarkable. IMPRESSION: No active cardiopulmonary disease. Electronically Signed   By: Signa Kell M.D.   On: 03/09/2023 12:28    Microbiology: Results for orders placed or performed during the hospital encounter of 03/09/23  Urine Culture     Status: None   Collection Time: 03/09/23 10:40 AM   Specimen: Urine, Clean Catch  Result Value Ref Range Status   Specimen Description   Final    URINE, CLEAN CATCH Performed at [facility], [address] [place], [place] [address]    Special Requests   Final    NONE Performed at [facility], [address] [place], [place] [address]    Culture   Final    NO GROWTH Performed at [facility], [address] [place], [place] [address]    Report Status 03/11/2023 FINAL  Final  Culture, blood (routine x 2)     Status: None (Preliminary result)   Collection Time: 03/09/23  2:45 PM   Specimen: BLOOD  Result Value Ref Range Status   Specimen Description   Final    BLOOD LEFT ANTECUBITAL Performed at [facility], [address] [place], [place] [address]    Special Requests   Final    BOTTLES DRAWN AEROBIC AND ANAEROBIC Blood Culture adequate volume Performed at [facility], [address] [place], [place] [address]    Culture   Final    NO GROWTH 4 DAYS Performed at [facility], [address], [place], [place] [address]    Report Status PENDING  Incomplete  Culture, blood  (routine x 2)     Status: None (Preliminary result)   Collection Time: 03/09/23  6:53 PM   Specimen: BLOOD  Result Value Ref Range Status   Specimen Description   Final    BLOOD BLOOD RIGHT ARM Performed at [facility], [address], [place], [place] [address]    Special Requests   Final    Blood Culture adequate volume BOTTLES DRAWN AEROBIC AND ANAEROBIC Performed at [facility], [address], [place], [place] [address]    Culture   Final    NO GROWTH 4 DAYS Performed at [facility], [address] [place], [place] [address]    Report Status PENDING  Incomplete    Labs: CBC: Recent Labs  Lab 03/09/23 1058 03/10/23 0425 03/11/23 0428 03/11/23 1422 03/12/23 0416  WBC 6.7 6.6 7.2  --  6.6  NEUTROABS 5.2  --  5.5  --   --   HGB 7.7* 6.7* 8.6* 8.4* 8.3*  HCT 26.8* 24.2* 29.0* 29.3* 29.5*  MCV 66.0* 68.0* 70.4*  --  72.3*  PLT 389 348 342  --  324   Basic Metabolic Panel: Recent Labs  Lab 03/09/23 1058 03/10/23 0425 03/10/23 1455  03/11/23 0428 03/12/23 0416  NA 135 137  --  140 137  K 3.2* 3.4*  --  3.6 3.5  CL 103 108  --  108 107  CO2 19* 23  --  21* 20*  GLUCOSE 124* 112*  --  112* 113*  BUN 9 7*  --  8 10  CREATININE 0.80 0.82  --  0.77 0.65  CALCIUM 8.9 8.6*  --  9.1 8.8*  MG  --   --  2.4 2.2 2.1  PHOS  --   --   --  4.4  --    Liver Function Tests: Recent Labs  Lab 03/09/23 1058 03/11/23 0428  AST 15  --   ALT 11  --   ALKPHOS 59  --   BILITOT 0.5  --   PROT 8.6*  --   ALBUMIN 3.4* 3.2*   CBG: No results for input(s): "GLUCAP" in the last 168 hours.  Discharge time spent: 38 minutes.   Signed: Kathlen Mody, MD [facility] 03/13/2023

## 2023-03-13 NOTE — Telephone Encounter (Signed)
Scheduled appointment per 10/29 secure chat by Dr.Gudena. Patient is aware of the made appointment as well as the time and date.

## 2023-03-14 LAB — MULTIPLE MYELOMA PANEL, SERUM
Albumin SerPl Elph-Mcnc: 3.3 g/dL (ref 2.9–4.4)
Albumin/Glob SerPl: 0.8 (ref 0.7–1.7)
Alpha 1: 0.4 g/dL (ref 0.0–0.4)
Alpha2 Glob SerPl Elph-Mcnc: 1.1 g/dL — ABNORMAL HIGH (ref 0.4–1.0)
B-Globulin SerPl Elph-Mcnc: 1.2 g/dL (ref 0.7–1.3)
Gamma Glob SerPl Elph-Mcnc: 2.1 g/dL — ABNORMAL HIGH (ref 0.4–1.8)
Globulin, Total: 4.7 g/dL — ABNORMAL HIGH (ref 2.2–3.9)
IgA: 430 mg/dL (ref 61–437)
IgG (Immunoglobin G), Serum: 2365 mg/dL — ABNORMAL HIGH (ref 603–1613)
IgM (Immunoglobulin M), Srm: 136 mg/dL (ref 20–172)
Total Protein ELP: 8 g/dL (ref 6.0–8.5)

## 2023-03-14 LAB — CULTURE, BLOOD (ROUTINE X 2)
Culture: NO GROWTH
Culture: NO GROWTH
Special Requests: ADEQUATE
Special Requests: ADEQUATE

## 2023-03-19 ENCOUNTER — Ambulatory Visit: Payer: 59 | Admitting: Hematology and Oncology

## 2023-03-19 NOTE — Progress Notes (Signed)
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  HISTORY OF PRESENTING ILLNESS:   Timothy Hunter is a wonderful 62 y.o. male who has been referred to [place] by Dr. 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   Patient presented to the ED on 03/09/2023 for GI bleed/acute blood loss anemia/microcytic anemia/low vitamin B12 levels and was seen by Dr. Pamelia Hoit.   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   Today, he is accompanied by his wife. He reports that he was recently seen in the hospital for anemia, with abdominal pain and black stools. His hgb was in 6s at the time. Patient received a CT scan of the abdomen and chest and was found to have several enlarged lymph glands in the center of his chest, including lung nodules and findings in the upper abdomen, bone, and thoracic spine. A biopsy of lymph node in retroperitoneum did show concerns for lymphoma.  Patient reports that he has otherwise been generally been pretty healthy otherwise. He notes taking medication for arthritis and reports a medical history of glaucoma. He denies any other chronic medical issues and does not take any medications on a regular basis otherwise. Patient denies having any surgeries in the past.   He reports that his lower back was evaluated earlier this year due to lower back  pain, and there were not any concerning findings at that time. He denies any pain in the middle back. His back pain has improved recently. His wife reports that his energy level has improved recently.   Patient does not smoke cigarettes at this time. He previously smoked 2 packs a day since he was 62 years old until about 2010. He reports that he rarely consumes alcohol and only consumes alcohol with social use. Patient does smoke marijuana.  There is a fhx of cancer. His wife reports that he has had family members that have passed away from cancer, but she is unsure of the type of cancer they had. His wife reports that patient's younger and older sister do have a history of cancer, and are in remission at this time. His older sister has breast cancer and breast cancer does run in the family. Patient denies any other medical issues in the family. He denies any blood disorders in the family.   Patient reports that he has not endorsed any black stools since being home from the hospital, and denies any other changes in bowel habits.   Patient's wife reports that patient did receive a blood transfusion while in the hospital, as well as potassium, but does not believe that patient received IV iron. His B12 level was also low at that time. He is not on any iron or B12 replacement at this time.   He denies having any other symptoms at this time. Patient denies any lightheadedness/dizziness, SOB, chest pain, abdominal pain, or new leg swelling.  His wife reports that he did take one Tramadol for back pain prevention. He did not have any back pain prior to  taking it.   He reports that he has lost 12 pounds over the course of about 3 months. Patient reports that his eating habits are normal at this time. He does drink two Ensure protein drinks a day and has gained 5-6 pounds recently. He reports that he was not put on any steroids recently.   Patient denies new lumps/bumps. His wife reports that patient has a  scar on right forehead, which has been present for a while. He denies any swelling in the groin or testicular pain/swelling.  MEDICAL HISTORY:  Past Medical History:  Diagnosis Date   Glaucoma associated with anomalies of iris     SURGICAL HISTORY: Past Surgical History:  Procedure Laterality Date   BIOPSY  03/11/2023   Procedure: BIOPSY;  Surgeon: Kerin Salen, MD;  Location: [facility];  Service: Gastroenterology;;   ESOPHAGOGASTRODUODENOSCOPY (EGD) WITH PROPOFOL N/A 03/11/2023   Procedure: ESOPHAGOGASTRODUODENOSCOPY (EGD) WITH PROPOFOL;  Surgeon: Kerin Salen, MD;  Location: [facility];  Service: Gastroenterology;  Laterality: N/A;   HOT HEMOSTASIS N/A 03/11/2023   Procedure: HOT HEMOSTASIS (ARGON PLASMA COAGULATION/BICAP);  Surgeon: Kerin Salen, MD;  Location: [facility];  Service: Gastroenterology;  Laterality: N/A;    SOCIAL HISTORY: Social History   Socioeconomic History   Marital status: Widowed    Spouse name: Not on file   Number of children: Not on file   Years of education: Not on file   Highest education level: Not on file  Occupational History   Not on file  Tobacco Use   Smoking status: Former   Smokeless tobacco: Not on file  Substance and Sexual Activity   Alcohol use: Yes    Comment: occasionally   Drug use: Yes    Types: Marijuana   Sexual activity: Not on file  Other Topics Concern   Not on file  Social History Narrative   Not on file   Social Determinants of Health   Financial Resource Strain: Not on file  Food Insecurity: No Food Insecurity (03/09/2023)   Hunger Vital Sign    Worried About Running Out of Food in the Last Year: Never true    Ran Out of Food in the Last Year: Never true  Transportation Needs: No Transportation Needs (03/09/2023)   PRAPARE - Administrator, Civil Service (Medical): No    Lack of Transportation (Non-Medical): No  Physical Activity: Not on file  Stress: Not on file  Social Connections: Not on  file  Intimate Partner Violence: Not At Risk (03/09/2023)   Humiliation, Afraid, Rape, and Kick questionnaire    Fear of Current or Ex-Partner: No    Emotionally Abused: No    Physically Abused: No    Sexually Abused: No    FAMILY HISTORY: No family history on file.  ALLERGIES:  has No Known Allergies.  MEDICATIONS:  Current Outpatient Medications  Medication Sig Dispense Refill   feeding supplement (ENSURE ENLIVE / ENSURE PLUS) LIQD Take 237 mLs by mouth 2 (two) times daily between meals. 237 mL 12   latanoprost (XALATAN) 0.005 % ophthalmic solution Place 1 drop into both eyes at bedtime.     pantoprazole (PROTONIX) 40 MG tablet Take 40 mg by mouth daily before breakfast.     timolol (TIMOPTIC) 0.5 % ophthalmic solution Place 1 drop into both eyes in the morning.     traMADol (ULTRAM) 50 MG tablet Take 1 tablet (50 mg total) by mouth every 6 (six) hours as needed for up to 8 days. 30 tablet  0   No current facility-administered medications for this visit.    REVIEW OF SYSTEMS:    10 Point review of Systems was done is negative except as noted above.  PHYSICAL EXAMINATION: ECOG PERFORMANCE STATUS: 2 - Symptomatic, <50% confined to bed  .There were no vitals filed for this visit. There were no vitals filed for this visit. .There is no height or weight on file to calculate BMI.  GENERAL:alert, in no acute distress and comfortable SKIN: no acute rashes, no significant lesions EYES: conjunctiva are pink and non-injected, sclera anicteric OROPHARYNX: MMM, no exudates, no oropharyngeal erythema or ulceration NECK: supple, no JVD LYMPH:  no palpable lymphadenopathy in the cervical, axillary or inguinal regions LUNGS: clear to auscultation b/l with normal respiratory effort HEART: regular rate & rhythm ABDOMEN:  normoactive bowel sounds , non tender, not distended. Extremity: no pedal edema PSYCH: alert & oriented x 3 with fluent speech NEURO: no focal motor/sensory  deficits  LABORATORY DATA:  I have reviewed the data as listed  .    Latest Ref Rng & Units 03/20/2023    2:55 PM 03/12/2023    4:16 AM 03/11/2023    2:22 PM  CBC  WBC 4.0 - 10.5 K/uL 5.6  6.6    Hemoglobin 13.0 - 17.0 g/dL 8.4  8.3  8.4   Hematocrit 39.0 - 52.0 % 28.6  29.5  29.3   Platelets 150 - 400 K/uL 389  324      .    Latest Ref Rng & Units 03/20/2023    2:55 PM 03/12/2023    4:16 AM 03/11/2023    4:28 AM  CMP  Glucose 70 - 99 mg/dL 696  295  284   BUN 8 - 23 mg/dL 11  10  8    Creatinine 0.61 - 1.24 mg/dL 1.32  4.40  1.02   Sodium 135 - 145 mmol/L 137  137  140   Potassium 3.5 - 5.1 mmol/L 3.5  3.5  3.6   Chloride 98 - 111 mmol/L 101  107  108   CO2 22 - 32 mmol/L 29  20  21    Calcium 8.9 - 10.3 mg/dL 9.6  8.8  9.1   Total Protein 6.5 - 8.1 g/dL 9.1     Total Bilirubin <1.2 mg/dL 0.3     Alkaline Phos 38 - 126 U/L 62     AST 15 - 41 U/L 15     ALT 0 - 44 U/L 12      03/11/2023 biopsy of left retroperitoneal lymph node:     RADIOGRAPHIC STUDIES: I have personally reviewed the radiological images as listed and agreed with the findings in the report. CT BIOPSY  Result Date: 03/11/2023 INDICATION: 62 year old male with indeterminate retroperitoneal lymphadenopathy. EXAM: CT BIOPSY COMPARISON:  None Available. MEDICATIONS: None. ANESTHESIA/SEDATION: Fentanyl 75 mcg IV; Versed 1.5 mg IV Sedation time: 14 minutes; The patient was continuously monitored during the procedure by the interventional radiology nurse under my direct supervision. CONTRAST:  None. COMPLICATIONS: None immediate. PROCEDURE: RADIATION DOSE REDUCTION: This exam was performed according to the departmental dose-optimization program which includes automated exposure control, adjustment of the mA and/or kV according to patient size and/or use of iterative reconstruction technique. Informed consent was obtained from the patient following an explanation of the procedure, risks, benefits and alternatives.  A time out was performed prior to the initiation of the procedure. The patient was positioned prone on the CT table and a limited CT was performed  for procedural planning demonstrating multifocal retroperitoneal lymphadenopathy. A left infrarenal prominent lymph node was targeted for biopsy. The procedure was planned. The operative site was prepped and draped in the usual sterile fashion. Appropriate trajectory was confirmed with a 22 gauge spinal needle after the adjacent tissues were anesthetized with 1% Lidocaine with epinephrine. Under intermittent CT guidance, a 17 gauge coaxial needle was advanced into the peripheral aspect of the mass. Appropriate positioning was confirmed and a total of 5 samples were obtained with an 18 gauge core needle biopsy device. Specimens were split between formalin and saline. The co-axial needle was removed and hemostasis was achieved with manual compression. A limited postprocedural CT was negative for hemorrhage or additional complication. A dressing was placed. The patient tolerated the procedure well without immediate postprocedural complication. IMPRESSION: Technically successful CT guided core needle biopsy of infrarenal left retroperitoneal lymph node. Marliss Coots, MD [facility] Electronically Signed   By: Marliss Coots M.D.   On: 03/11/2023 16:48   CT Chest W Contrast  Result Date: 03/10/2023 CLINICAL DATA:  Respiratory illness EXAM: CT CHEST WITH CONTRAST TECHNIQUE: Multidetector CT imaging of the chest was performed during intravenous contrast administration. RADIATION DOSE REDUCTION: This exam was performed according to the departmental dose-optimization program which includes automated exposure control, adjustment of the mA and/or kV according to patient size and/or use of iterative reconstruction technique. CONTRAST:  75mL OMNIPAQUE IOHEXOL 300 MG/ML  SOLN COMPARISON:  Chest x-ray 03/09/2023, CT  03/09/2023 FINDINGS: Cardiovascular: Mild aortic atherosclerosis. No aneurysm. Coronary vascular calcification. Normal cardiac size. No pericardial effusion Mediastinum/Nodes: Midline trachea. No thyroid mass. Borderline AP window lymph nodes measuring up to 10 mm. Right hilar node measuring 1.5 mm. Subcarinal lymph node measuring 16 mm. Multiple enlarged posterior mediastinal lymph nodes, measuring up to 14 mm. Lungs/Pleura: Mild emphysema. No acute airspace disease, pleural effusion or pneumothorax. 2 adjacent right lung base pulmonary nodules. Irregular 12 mm right lung base pulmonary nodule series 8, image 134, and adjacent 7 mm oval pulmonary nodule on series 8, image 132. No other suspicious pulmonary nodules. Upper Abdomen: See separately dictated abdominopelvic CT. Partially visualized retroperitoneal and upper abdominal adenopathy. Musculoskeletal: Heterogeneous sclerosis and lucency in the T1, T9 and T11 vertebral bodies with mild sclerosis at T10 and L1, consistent with skeletal metastatic disease. Mild loss of height of the T9 vertebral body. Associated right paravertebral soft tissue mass. Right paravertebral soft tissue mass also evident at T11, there may be epidural tissue at T11 as well. IMPRESSION: 1. No CT evidence for acute intrathoracic abnormality. 2. Mild emphysema. 2 adjacent right lung base pulmonary nodules measuring up to 12 mm, possible metastatic disease. 3. Mediastinal and right hilar adenopathy, suspicious for metastatic or lymphoproliferative disease. 4. Skeletal metastatic disease involving the T1, T9 and T11 vertebral bodies with mild loss of height of the T9 vertebral body. Right paravertebral soft tissue mass at T9 and T11, there may be epidural extension of tissue at T11 as well. This may be correlated with dedicated thoracic MRI. 5. Partially visualized retroperitoneal and upper abdominal adenopathy. 6. Aortic atherosclerosis. Aortic Atherosclerosis (ICD10-I70.0) and Emphysema  (ICD10-J43.9). Electronically Signed   By: Jasmine Pang M.D.   On: 03/10/2023 01:49   CT ABDOMEN PELVIS W CONTRAST  Result Date: 03/09/2023 CLINICAL DATA:  Acute abdominal pain.  Vomiting. EXAM: CT ABDOMEN AND PELVIS WITH CONTRAST TECHNIQUE: Multidetector CT imaging of the abdomen and pelvis was performed using the standard protocol following bolus administration of intravenous contrast.  RADIATION DOSE REDUCTION: This exam was performed according to the departmental dose-optimization program which includes automated exposure control, adjustment of the mA and/or kV according to patient size and/or use of iterative reconstruction technique. CONTRAST:  75mL OMNIPAQUE IOHEXOL 300 MG/ML  SOLN COMPARISON:  CT chest 12/07/2019, CT abdomen pelvis 09/09/2014. FINDINGS: Lower chest: 10 mm nodule in the RIGHT lower lobe (image 8) is new from lung cancer screening CT in 2021. Thickened paraspinal pleura in the medial RIGHT lower lobe (image 1) is abnormal. Hepatobiliary: No focal hepatic lesion. Normal gallbladder. No biliary duct dilatation. Common bile duct is normal. Pancreas: Pancreas is normal. No ductal dilatation. No pancreatic inflammation. Spleen: Normal spleen Adrenals/urinary tract: Adrenal glands and kidneys are normal. The ureters and bladder normal. Stomach/Bowel: Stomach, small bowel, appendix, and cecum are normal. The colon and rectosigmoid colon are normal. Vascular/Lymphatic: Enlarged lymph node LEFT aorta at the level the kidneys measures 24 mm on image 32. Aortocaval node at the same level measures 17 mm on image 30. Enlarged lymph nodes extend into the retrocrural space. For example 17 mm retrocrural node on image 14. Large periportal lymph node measures 29 mm on image 20. No iliac lymphadenopathy. No inguinal lymphadenopathy. Reproductive: Prostate unremarkable Other: No free fluid. Musculoskeletal: Sclerotic changes in the lower thoracic spine new from comparison CT. Lytic lesion the posterior  RIGHT aspect of the T11 vertebral body (image 10/301) is new from prior. IMPRESSION: 1. Upper abdominal lymphadenopathy and retrocrural adenopathy is consistent with malignancy. 2. New sclerotic and lytic changes in the thoracic spine consistent with metastatic disease. 3. Nodule in the RIGHT lower lobe is new from prior concerning for metastatic disease. 4. Differential would include lung cancer versus lymphoma in patient with smoking history. Electronically Signed   By: Genevive Bi M.D.   On: 03/09/2023 12:45   DG Chest 2 View  Result Date: 03/09/2023 CLINICAL DATA:  Shortness of breath.  Abdominal pain.  Vomiting. EXAM: CHEST - 2 VIEW COMPARISON:  None Available. FINDINGS: The heart size and mediastinal contours are within normal limits. Both lungs are clear. The visualized skeletal structures are unremarkable. IMPRESSION: No active cardiopulmonary disease. Electronically Signed   By: Signa Kell M.D.   On: 03/09/2023 12:28    ASSESSMENT & PLAN:  62 y.o. 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  PLAN:  -CBC from 03/12/2023 showed WBC of 6.6K, hemoglobin of 8.3, and platelets of 324K. -upper endoscopy on 03/11/2023 which showed scarred mucosa in the antrum, which was biopsied; two angioectasias in the duodenum; and esophageal plaques suspicious for candidiasis.  -pathologist impression of CT abdomen scan on 03/09/2023 showed: 1. Upper abdominal lymphadenopathy and retrocrural adenopathy is consistent with malignancy. 2. New sclerotic and lytic changes in the thoracic spine consistent with metastatic disease. 3. Nodule in the RIGHT lower lobe is new from prior concerning for metastatic disease. 4. Differential would include lung cancer versus lymphoma in patient with smoking history. -pathologist impression of CT chest scan showed: 1. No CT evidence  for acute intrathoracic abnormality. 2. Mild emphysema. 2 adjacent right lung base pulmonary nodules measuring up to 12 mm, possible metastatic disease. 3. Mediastinal and right hilar adenopathy, suspicious for metastatic or lymphoproliferative disease. 4. Skeletal metastatic disease involving the T1, T9 and T11 vertebral bodies with mild loss of height of the T9 vertebral body. Right paravertebral soft tissue mass at T9 and T11, there may be epidural extension  of tissue at T11 as well. This may be correlated with dedicated thoracic MRI. 5. Partially visualized retroperitoneal and upper abdominal adenopathy. 6. Aortic atherosclerosis. -discussed details of different types of lymphomas -Biopsy of one of the lymph nodes in his retroperitoneum did confirm lymphoma. 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  -Discussed that findings were concerning for Hodgkin's, but patient was also positive for CD20  -discussed that his condition is considered stage 4 disease given involvement in the bones and nodules in lung -discussed that gray-zone lymphoma is a rare entity and the guidelines for treatment would be to treat it as large B-cell lymphoma. Discussed that Hodgkin's would also be treated with that regimen, though it would not be the standard treatment.  -discussed that there are specific treatment regimens for hodgkin's and large B-cell lymphoma, and we would therefore want to be as definitive as possible.  -discussed that diffuse large B-cell lymphomas are potentially curable. There is not much data on gray zone lymphoma response, and it may require a more aggressive treatment regimen to some degree -will review CT scan images to determine whether it would be reasonably feasible to  obtain an additional biopsy -discussed that we would ideally want to biopsy the most active and accessible lymph node -recommend an additional surgical biopsy to determine his exact condition and obtaining an additional opinion from a pathologist at an academic center.  -recommend taking vitamin B complex one capsule a day OTC support blood count -recommend taking 2000 units of vitamin D -discussed that there may be a role for steroids if there are any immediate concerns, but I would be hesitant to do so as steroids may cause lymph nodes to shrink and make it more difficult to biopsy. There are no immediate concerns at this time.  -will plan for PET scan for further evaluation. Discussed that the PET scan would also allow [place] to better understand the localization of all the lymph nodes in the area.  -will order echocardiogram to determine his baseline -will plan for port placement  -if I feel based on his CT scan that it would be best to receive a biopsy from the chest, patient would be agreeable to a consult with cardiothoracic surgeon -will place referral to cardiothoracic surgeon -will order blood tests today for further evaluation -discussed that there may be a role to correct iron or vitamin D levels depending on labs -answered all of patient's and his wife's questions in detail  . Orders Placed This Encounter  Procedures   NM PET Image Initial (PI) Skull Base To Thigh    Standing Status:   Future    Standing Expiration Date:   03/19/2024    Order Specific Question:   If indicated for the ordered procedure, I authorize the administration of a radiopharmaceutical per Radiology protocol    Answer:   Yes    Order Specific Question:   Preferred imaging location?    Answer:      [facility] GUIDED PORT INSERTION    Standing Status:   Future    Number of Occurrences:   1    Standing Expiration Date:   03/19/2024    Order Specific Question:   Reason for Exam (SYMPTOM  OR DIAGNOSIS  REQUIRED)    Answer:   porta cath placement for chemotherapy for newly diagnosed lymphoma  Order Specific Question:   Preferred Imaging Location?    Answer:   [facility]   CBC with Differential (Cancer Center Only)    Standing Status:   Future    Number of Occurrences:   1    Standing Expiration Date:   03/19/2024   CMP (Cancer Center only)    Standing Status:   Future    Number of Occurrences:   1    Standing Expiration Date:   03/19/2024   Lactate dehydrogenase    Standing Status:   Future    Number of Occurrences:   1    Standing Expiration Date:   03/19/2024   Sedimentation rate    Standing Status:   Future    Number of Occurrences:   1    Standing Expiration Date:   03/19/2024   Hepatitis C antibody    Standing Status:   Future    Number of Occurrences:   1    Standing Expiration Date:   03/19/2024   Hepatitis B core antibody, total    Standing Status:   Future    Number of Occurrences:   1    Standing Expiration Date:   03/19/2024   Hepatitis B surface antigen    Standing Status:   Future    Number of Occurrences:   1    Standing Expiration Date:   03/19/2024   HIV Antibody (routine testing w rflx)    Standing Status:   Future    Number of Occurrences:   1    Standing Expiration Date:   03/19/2024   Ferritin    Standing Status:   Future    Number of Occurrences:   1    Standing Expiration Date:   03/19/2024   Iron and Iron Binding Capacity ([facility] only)    Standing Status:   Future    Number of Occurrences:   1    Standing Expiration Date:   03/19/2024   Vitamin B12    Standing Status:   Future    Number of Occurrences:   1    Standing Expiration Date:   03/19/2024   ECHOCARDIOGRAM COMPLETE    Standing Status:   Future    Standing Expiration Date:   03/19/2024    Order Specific Question:   Where should this test be performed    Answer:   [facility]    Order Specific Question:   Perflutren DEFINITY (image enhancing agent) should be administered unless  hypersensitivity or allergy exist    Answer:   Administer Perflutren    Order Specific Question:   Reason for exam-Echo    Answer:   Chemo  Z09   Sample to Blood Bank    Standing Status:   Future    Number of Occurrences:   1    Standing Expiration Date:   03/19/2024    FOLLOW-UP: Labs today PET/CT in 5-7 days ECHO ASAP IR for portacath placement ASAP Referral to Dr Dorris Fetch for consideration of mediastinal LN biopsy IV feraheme 510mg  x 1 dose ASAP RTC with Dr Candise Che in 2 weeks  The total time spent in the appointment was 63 minutes* .  All of the patient's questions were answered with apparent satisfaction. The patient knows to call the clinic with any problems, questions or concerns.   Wyvonnia Lora MD MS AAHIVMS [facility] Hematology/Oncology Physician [facility] [employer]  .*Total Encounter Time as defined by the Centers for Medicare and Medicaid Services includes, in  addition to the face-to-face time of a patient visit (documented in the note above) non-face-to-face time: obtaining and reviewing outside history, ordering and reviewing medications, tests or procedures, care coordination (communications with other health care professionals or caregivers) and documentation in the medical record.    I,Mitra Faeizi,acting as a Neurosurgeon for Wyvonnia Lora, MD.,have documented all relevant documentation on the behalf of Wyvonnia Lora, MD,as directed by  Wyvonnia Lora, MD while in the presence of Wyvonnia Lora, MD.  .I have reviewed the above documentation for accuracy and completeness, and I agree with the above. [name] MD

## 2023-03-20 ENCOUNTER — Inpatient Hospital Stay: Payer: 59 | Attending: Hematology | Admitting: Hematology

## 2023-03-20 ENCOUNTER — Inpatient Hospital Stay: Payer: 59

## 2023-03-20 VITALS — BP 122/74 | HR 75 | Temp 97.0°F | Resp 20 | Wt 154.0 lb

## 2023-03-20 DIAGNOSIS — D62 Acute posthemorrhagic anemia: Secondary | ICD-10-CM | POA: Diagnosis not present

## 2023-03-20 DIAGNOSIS — Z79899 Other long term (current) drug therapy: Secondary | ICD-10-CM | POA: Diagnosis not present

## 2023-03-20 DIAGNOSIS — Z803 Family history of malignant neoplasm of breast: Secondary | ICD-10-CM | POA: Insufficient documentation

## 2023-03-20 DIAGNOSIS — C8519 Unspecified B-cell lymphoma, extranodal and solid organ sites: Secondary | ICD-10-CM | POA: Insufficient documentation

## 2023-03-20 DIAGNOSIS — J439 Emphysema, unspecified: Secondary | ICD-10-CM | POA: Diagnosis not present

## 2023-03-20 DIAGNOSIS — I700 Atherosclerosis of aorta: Secondary | ICD-10-CM | POA: Insufficient documentation

## 2023-03-20 DIAGNOSIS — Z87891 Personal history of nicotine dependence: Secondary | ICD-10-CM | POA: Insufficient documentation

## 2023-03-20 DIAGNOSIS — K31811 Angiodysplasia of stomach and duodenum with bleeding: Secondary | ICD-10-CM | POA: Diagnosis not present

## 2023-03-20 DIAGNOSIS — C8588 Other specified types of non-Hodgkin lymphoma, lymph nodes of multiple sites: Secondary | ICD-10-CM

## 2023-03-20 DIAGNOSIS — H409 Unspecified glaucoma: Secondary | ICD-10-CM | POA: Diagnosis not present

## 2023-03-20 DIAGNOSIS — D509 Iron deficiency anemia, unspecified: Secondary | ICD-10-CM

## 2023-03-20 LAB — CBC WITH DIFFERENTIAL (CANCER CENTER ONLY)
Abs Immature Granulocytes: 0.02 10*3/uL (ref 0.00–0.07)
Basophils Absolute: 0 10*3/uL (ref 0.0–0.1)
Basophils Relative: 1 %
Eosinophils Absolute: 0.2 10*3/uL (ref 0.0–0.5)
Eosinophils Relative: 4 %
HCT: 28.6 % — ABNORMAL LOW (ref 39.0–52.0)
Hemoglobin: 8.4 g/dL — ABNORMAL LOW (ref 13.0–17.0)
Immature Granulocytes: 0 %
Lymphocytes Relative: 19 %
Lymphs Abs: 1.1 10*3/uL (ref 0.7–4.0)
MCH: 20.4 pg — ABNORMAL LOW (ref 26.0–34.0)
MCHC: 29.4 g/dL — ABNORMAL LOW (ref 30.0–36.0)
MCV: 69.4 fL — ABNORMAL LOW (ref 80.0–100.0)
Monocytes Absolute: 0.4 10*3/uL (ref 0.1–1.0)
Monocytes Relative: 7 %
Neutro Abs: 3.9 10*3/uL (ref 1.7–7.7)
Neutrophils Relative %: 69 %
Platelet Count: 389 10*3/uL (ref 150–400)
RBC: 4.12 MIL/uL — ABNORMAL LOW (ref 4.22–5.81)
RDW: 24.2 % — ABNORMAL HIGH (ref 11.5–15.5)
Smear Review: NORMAL
WBC Count: 5.6 10*3/uL (ref 4.0–10.5)
nRBC: 0 % (ref 0.0–0.2)

## 2023-03-20 LAB — SAMPLE TO BLOOD BANK

## 2023-03-20 LAB — CMP (CANCER CENTER ONLY)
ALT: 12 U/L (ref 0–44)
AST: 15 U/L (ref 15–41)
Albumin: 3.9 g/dL (ref 3.5–5.0)
Alkaline Phosphatase: 62 U/L (ref 38–126)
Anion gap: 7 (ref 5–15)
BUN: 11 mg/dL (ref 8–23)
CO2: 29 mmol/L (ref 22–32)
Calcium: 9.6 mg/dL (ref 8.9–10.3)
Chloride: 101 mmol/L (ref 98–111)
Creatinine: 1.04 mg/dL (ref 0.61–1.24)
GFR, Estimated: >60 mL/min (ref >60)
Glucose, Bld: 111 mg/dL — ABNORMAL HIGH (ref 70–99)
Potassium: 3.5 mmol/L (ref 3.5–5.1)
Sodium: 137 mmol/L (ref 135–145)
Total Bilirubin: 0.3 mg/dL (ref <1.2)
Total Protein: 9.1 g/dL — ABNORMAL HIGH (ref 6.5–8.1)

## 2023-03-20 LAB — VITAMIN B12: Vitamin B-12: 263 pg/mL (ref 180–914)

## 2023-03-20 LAB — LACTATE DEHYDROGENASE: LDH: 190 U/L (ref 98–192)

## 2023-03-20 LAB — SEDIMENTATION RATE: Sed Rate: 101 mm/h — ABNORMAL HIGH (ref 0–16)

## 2023-03-20 LAB — IRON AND IRON BINDING CAPACITY (CC-WL,HP ONLY)
Iron: 12 ug/dL — ABNORMAL LOW (ref 45–182)
Saturation Ratios: 4 % — ABNORMAL LOW (ref 17.9–39.5)
TIBC: 336 ug/dL (ref 250–450)
UIBC: 324 ug/dL (ref 117–376)

## 2023-03-20 LAB — HEPATITIS C ANTIBODY: HCV Ab: NONREACTIVE

## 2023-03-20 LAB — HIV ANTIBODY (ROUTINE TESTING W REFLEX): HIV Screen 4th Generation wRfx: NONREACTIVE

## 2023-03-20 LAB — HEPATITIS B SURFACE ANTIGEN: Hepatitis B Surface Ag: NONREACTIVE

## 2023-03-20 LAB — HEPATITIS B CORE ANTIBODY, TOTAL: Hep B Core Total Ab: NONREACTIVE

## 2023-03-20 NOTE — Progress Notes (Signed)
Accompanied Dr Candise Che on initial office visit, introduced myself and explained my role in patients care. Explained to patient and wife that I would be getting procedures scheduled and would call to inform them of dates and times, answered any questions pertaining to this and walked patient and wife to lab.Will continue to monitor.

## 2023-03-21 LAB — FERRITIN: Ferritin: 20 ng/mL — ABNORMAL LOW (ref 24–336)

## 2023-03-24 ENCOUNTER — Other Ambulatory Visit: Payer: Self-pay | Admitting: Radiology

## 2023-03-25 NOTE — H&P (Signed)
Referring Physician(s): [name]  Supervising Physician: Simonne Come  Patient Status:  [facility]  Chief Complaint:  "I'm here for a port a cath"  Subjective: Pt known to IR team from left retroperitoneal lymph node biopsy on 03/11/23. He is a 62 yo male ex smoker with PMH sig for glaucoma, anemia and newly diagnosed B cell lymphoma. He is scheduled today for port a cath placement to assist with treatment. He currently denies fever,HA,CP,cough, abd/back pain,N/V or bleeding; he does have some chronic dyspnea.   Past Medical History:  Diagnosis Date   Glaucoma associated with anomalies of iris    Past Surgical History:  Procedure Laterality Date   BIOPSY  03/11/2023   Procedure: BIOPSY;  Surgeon: Kerin Salen, MD;  Location: [facility];  Service: Gastroenterology;;   ESOPHAGOGASTRODUODENOSCOPY (EGD) WITH PROPOFOL N/A 03/11/2023   Procedure: ESOPHAGOGASTRODUODENOSCOPY (EGD) WITH PROPOFOL;  Surgeon: Kerin Salen, MD;  Location: [facility];  Service: Gastroenterology;  Laterality: N/A;   HOT HEMOSTASIS N/A 03/11/2023   Procedure: HOT HEMOSTASIS (ARGON PLASMA COAGULATION/BICAP);  Surgeon: Kerin Salen, MD;  Location: [facility];  Service: Gastroenterology;  Laterality: N/A;      Allergies: Patient has no known allergies.  Medications: Prior to Admission medications   Medication Sig Start Date End Date Taking? Authorizing Provider  feeding supplement (ENSURE ENLIVE / ENSURE PLUS) LIQD Take 237 mLs by mouth 2 (two) times daily between meals. 03/12/23   Kathlen Mody, MD  latanoprost (XALATAN) 0.005 % ophthalmic solution Place 1 drop into both eyes at bedtime. 10/01/20   [provider]  pantoprazole (PROTONIX) 40 MG tablet Take 40 mg by mouth daily before breakfast.    [provider]  timolol (TIMOPTIC) 0.5 % ophthalmic solution Place 1 drop into both eyes in the morning. 10/01/20   [provider]     Vital Signs: Vitals:   03/26/23  0935  BP: 139/86  Pulse: 92  Resp: 18  Temp: 98.4 F (36.9 C)  SpO2: 100%      Code Status: FULL CODE  Physical Exam: awake/alert; chest- distant BS bilat; heart- RRR; abd-soft,+BS,NT; no LE edema  Imaging: No results found.  Labs:  CBC: Recent Labs    03/10/23 0425 03/11/23 0428 03/11/23 1422 03/12/23 0416 03/20/23 1455  WBC 6.6 7.2  --  6.6 5.6  HGB 6.7* 8.6* 8.4* 8.3* 8.4*  HCT 24.2* 29.0* 29.3* 29.5* 28.6*  PLT 348 342  --  324 389    COAGS: Recent Labs    03/09/23 1058 03/11/23 0428  INR 1.2 1.2    BMP: Recent Labs    03/10/23 0425 03/11/23 0428 03/12/23 0416 03/20/23 1455  NA 137 140 137 137  K 3.4* 3.6 3.5 3.5  CL 108 108 107 101  CO2 23 21* 20* 29  GLUCOSE 112* 112* 113* 111*  BUN 7* 8 10 11   CALCIUM 8.6* 9.1 8.8* 9.6  CREATININE 0.82 0.77 0.65 1.04  GFRNONAA >60 >60 >60 >60    LIVER FUNCTION TESTS: Recent Labs    03/09/23 1058 03/11/23 0428 03/20/23 1455  BILITOT 0.5  --  0.3  AST 15  --  15  ALT 11  --  12  ALKPHOS 59  --  62  PROT 8.6*  --  9.1*  ALBUMIN 3.4* 3.2* 3.9    Assessment and Plan: 62 yo male ex smoker with PMH sig for glaucoma, anemia and newly diagnosed B cell lymphoma. He is scheduled today for port a cath placement to  assist with treatment. Risks and benefits of image guided port-a-catheter placement was discussed with the patient including, but not limited to bleeding, infection, pneumothorax, or fibrin sheath development and need for additional procedures.  All of the patient's questions were answered, patient is agreeable to proceed. Consent signed and in chart.    Electronically Signed: D. Jeananne Rama, PA-C 03/25/2023, 1:44 PM   I spent a total of 25 minutes at the the patient's bedside AND on the patient's hospital floor or unit, greater than 50% of which was counseling/coordinating care for port a cath placement

## 2023-03-26 ENCOUNTER — Ambulatory Visit (HOSPITAL_COMMUNITY)
Admission: RE | Admit: 2023-03-26 | Discharge: 2023-03-26 | Disposition: A | Discharge status: Home / Self Care | Payer: 59 | Source: Ambulatory Visit | Attending: Hematology | Admitting: Hematology

## 2023-03-26 ENCOUNTER — Inpatient Hospital Stay (HOSPITAL_COMMUNITY)
Admission: RE | Admit: 2023-03-26 | Discharge: 2023-03-26 | Discharge status: Home / Self Care | Payer: 59 | Source: Ambulatory Visit | Attending: Hematology

## 2023-03-26 ENCOUNTER — Encounter (HOSPITAL_COMMUNITY): Payer: Self-pay

## 2023-03-26 DIAGNOSIS — C8590 Non-Hodgkin lymphoma, unspecified, unspecified site: Secondary | ICD-10-CM | POA: Diagnosis not present

## 2023-03-26 DIAGNOSIS — C8588 Other specified types of non-Hodgkin lymphoma, lymph nodes of multiple sites: Secondary | ICD-10-CM | POA: Insufficient documentation

## 2023-03-26 DIAGNOSIS — Z87891 Personal history of nicotine dependence: Secondary | ICD-10-CM | POA: Diagnosis not present

## 2023-03-26 DIAGNOSIS — R0609 Other forms of dyspnea: Secondary | ICD-10-CM | POA: Diagnosis not present

## 2023-03-26 HISTORY — DX: Dyspnea, unspecified: R06.00

## 2023-03-26 HISTORY — PX: IR IMAGING GUIDED PORT INSERTION: IMG5740

## 2023-03-26 MED ORDER — SODIUM CHLORIDE 0.9 % IV SOLN: INTRAVENOUS | Status: DC

## 2023-03-26 MED ORDER — LIDOCAINE-EPINEPHRINE 1 %-1:100000 IJ SOLN
20.0000 mL | Freq: Once | INTRAMUSCULAR | Status: AC
  Administered 2023-03-26: 20 mL via INTRADERMAL

## 2023-03-26 MED ORDER — MIDAZOLAM HCL 2 MG/2ML IJ SOLN
INTRAMUSCULAR | Status: AC
  Filled 2023-03-26: qty 2

## 2023-03-26 MED ORDER — FENTANYL CITRATE (PF) 100 MCG/2ML IJ SOLN
INTRAMUSCULAR | Status: AC | PRN
  Administered 2023-03-26 (×2): 50 ug via INTRAVENOUS

## 2023-03-26 MED ORDER — LIDOCAINE-EPINEPHRINE 1 %-1:100000 IJ SOLN
INTRAMUSCULAR | Status: AC
  Filled 2023-03-26: qty 1

## 2023-03-26 MED ORDER — FENTANYL CITRATE (PF) 100 MCG/2ML IJ SOLN
INTRAMUSCULAR | Status: AC
  Filled 2023-03-26: qty 2

## 2023-03-26 MED ORDER — HEPARIN SOD (PORK) LOCK FLUSH 100 UNIT/ML IV SOLN
INTRAVENOUS | Status: AC
  Filled 2023-03-26: qty 5

## 2023-03-26 MED ORDER — HEPARIN SOD (PORK) LOCK FLUSH 100 UNIT/ML IV SOLN
500.0000 [IU] | Freq: Once | INTRAVENOUS | Status: AC
  Administered 2023-03-26: 500 [IU] via INTRAVENOUS

## 2023-03-26 MED ORDER — MIDAZOLAM HCL 2 MG/2ML IJ SOLN
INTRAMUSCULAR | Status: AC | PRN
  Administered 2023-03-26 (×2): 1 mg via INTRAVENOUS

## 2023-03-26 MED ORDER — MIDAZOLAM HCL 2 MG/2ML IJ SOLN
INTRAMUSCULAR | Status: AC
Start: 2023-03-26 — End: ?
  Filled 2023-03-26: qty 2

## 2023-03-26 NOTE — Progress Notes (Incomplete)
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  HISTORY OF PRESENTING ILLNESS:  Timothy Hunter is a wonderful 62 y.o. male who has been referred to [place] by Dr. 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   Patient presented to the ED on 03/09/2023 for GI bleed/acute blood loss anemia/microcytic anemia/low vitamin B12 levels and was seen by Dr. Pamelia Hoit.   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   Today, he is accompanied by his wife. He reports that he was recently seen in the hospital for anemia, with abdominal pain and black stools. His hgb was in 6s at the time. Patient received a CT scan of the abdomen and chest and was found to have several enlarged lymph glands in the center of his chest, including lung nodules and findings in the upper abdomen, bone, and thoracic spine. A biopsy of lymph node in retroperitoneum did show concerns for lymphoma.  Patient reports that he has otherwise been generally been pretty healthy otherwise. He notes taking medication for arthritis and reports a medical history of glaucoma. He denies any other chronic medical issues and does not take any medications on a regular basis otherwise. Patient denies having any surgeries in the past.   He reports that his lower back was evaluated earlier this year due to lower back pain,  and there were not any concerning findings at that time. He denies any pain in the middle back. His back pain has improved recently. His wife reports that his energy level has improved recently.   Patient does not smoke cigarettes at this time. He previously smoked 2 packs a day since he was 62 years old until about 2010. He reports that he rarely consumes alcohol and only consumes alcohol with social use. Patient does smoke marijuana.  There is a fhx of cancer. His wife reports that he has had family members that have passed away from cancer, but she is unsure of the type of cancer they had. His wife reports that patient's younger and older sister do have a history of cancer, and are in remission at this time. His older sister has breast cancer and breast cancer does run in the family. Patient denies any other medical issues in the family. He denies any blood disorders in the family.   Patient reports that he has not endorsed any black stools since being home from the hospital, and denies any other changes in bowel habits.   Patient's wife reports that patient did receive a blood transfusion while in the hospital, as well as potassium, but does not believe that patient received IV iron. His B12 level was also low at that time. He is not on any iron or B12 replacement at this time.   He denies having any other symptoms at this time. Patient denies any lightheadedness/dizziness, SOB, chest pain, abdominal pain, or new leg swelling.  His wife reports that he did take one Tramadol for back pain prevention. He did not have any back pain prior to taking  it.   He reports that he has lost 12 pounds over the course of about 3 months. Patient reports that his eating habits are normal at this time. He does drink two Ensure protein drinks a day and has gained 5-6 pounds recently. He reports that he was not put on any steroids recently.   Patient denies new lumps/bumps. His wife reports that patient has a scar  on right forehead, which has been present for a while. He denies any swelling in the groin or testicular pain/swelling.  MEDICAL HISTORY:  Past Medical History:  Diagnosis Date  . Glaucoma associated with anomalies of iris     SURGICAL HISTORY: Past Surgical History:  Procedure Laterality Date  . BIOPSY  03/11/2023   Procedure: BIOPSY;  Surgeon: Kerin Salen, MD;  Location: [facility];  Service: Gastroenterology;;  . ESOPHAGOGASTRODUODENOSCOPY (EGD) WITH PROPOFOL N/A 03/11/2023   Procedure: ESOPHAGOGASTRODUODENOSCOPY (EGD) WITH PROPOFOL;  Surgeon: Kerin Salen, MD;  Location: [facility];  Service: Gastroenterology;  Laterality: N/A;  . HOT HEMOSTASIS N/A 03/11/2023   Procedure: HOT HEMOSTASIS (ARGON PLASMA COAGULATION/BICAP);  Surgeon: Kerin Salen, MD;  Location: [facility];  Service: Gastroenterology;  Laterality: N/A;    SOCIAL HISTORY: Social History   Socioeconomic History  . Marital status: Widowed    Spouse name: Not on file  . Number of children: Not on file  . Years of education: Not on file  . Highest education level: Not on file  Occupational History  . Not on file  Tobacco Use  . Smoking status: Former  . Smokeless tobacco: Not on file  Substance and Sexual Activity  . Alcohol use: Yes    Comment: occasionally  . Drug use: Yes    Types: Marijuana  . Sexual activity: Not on file  Other Topics Concern  . Not on file  Social History Narrative  . Not on file   Social Determinants of Health   Financial Resource Strain: Not on file  Food Insecurity: No Food Insecurity (03/09/2023)   Hunger Vital Sign   . Worried About Programme researcher, broadcasting/film/video in the Last Year: Never true   . Ran Out of Food in the Last Year: Never true  Transportation Needs: No Transportation Needs (03/09/2023)   PRAPARE - Transportation   . Lack of Transportation (Medical): No   . Lack of Transportation (Non-Medical): No  Physical Activity: Not on file  Stress: Not on file  Social  Connections: Not on file  Intimate Partner Violence: Not At Risk (03/09/2023)   Humiliation, Afraid, Rape, and Kick questionnaire   . Fear of Current or Ex-Partner: No   . Emotionally Abused: No   . Physically Abused: No   . Sexually Abused: No    FAMILY HISTORY: No family history on file.  ALLERGIES:  has No Known Allergies.  MEDICATIONS:  Current Outpatient Medications  Medication Sig Dispense Refill  . feeding supplement (ENSURE ENLIVE / ENSURE PLUS) LIQD Take 237 mLs by mouth 2 (two) times daily between meals. 237 mL 12  . latanoprost (XALATAN) 0.005 % ophthalmic solution Place 1 drop into both eyes at bedtime.    . pantoprazole (PROTONIX) 40 MG tablet Take 40 mg by mouth daily before breakfast.    . timolol (TIMOPTIC) 0.5 % ophthalmic solution Place 1 drop into both eyes in the morning.    . traMADol (ULTRAM) 50 MG tablet Take 1 tablet (50 mg total) by mouth every 6 (six) hours as needed for up to 8 days. 30 tablet 0  No current facility-administered medications for this visit.    REVIEW OF SYSTEMS:    10 Point review of Systems was done is negative except as noted above.  PHYSICAL EXAMINATION: ECOG PERFORMANCE STATUS: {CHL ONC ECOG BJ:[id number]}  .There were no vitals filed for this visit. There were no vitals filed for this visit. .There is no height or weight on file to calculate BMI.  GENERAL:alert, in no acute distress and comfortable SKIN: no acute rashes, no significant lesions EYES: conjunctiva are pink and non-injected, sclera anicteric OROPHARYNX: MMM, no exudates, no oropharyngeal erythema or ulceration NECK: supple, no JVD LYMPH:  no palpable lymphadenopathy in the cervical, axillary or inguinal regions LUNGS: clear to auscultation b/l with normal respiratory effort HEART: regular rate & rhythm ABDOMEN:  normoactive bowel sounds , non tender, not distended. Extremity: no pedal edema PSYCH: alert & oriented x 3 with fluent speech NEURO: no focal  motor/sensory deficits  LABORATORY DATA:  I have reviewed the data as listed  .    Latest Ref Rng & Units 03/12/2023    4:16 AM 03/11/2023    2:22 PM 03/11/2023    4:28 AM  CBC  WBC 4.0 - 10.5 K/uL 6.6   7.2   Hemoglobin 13.0 - 17.0 g/dL 8.3  8.4  8.6   Hematocrit 39.0 - 52.0 % 29.5  29.3  29.0   Platelets 150 - 400 K/uL 324   342     .    Latest Ref Rng & Units 03/12/2023    4:16 AM 03/11/2023    4:28 AM 03/10/2023    4:25 AM  CMP  Glucose 70 - 99 mg/dL 086  578  469   BUN 8 - 23 mg/dL 10  8  7    Creatinine 0.61 - 1.24 mg/dL 6.29  5.28  4.13   Sodium 135 - 145 mmol/L 137  140  137   Potassium 3.5 - 5.1 mmol/L 3.5  3.6  3.4   Chloride 98 - 111 mmol/L 107  108  108   CO2 22 - 32 mmol/L 20  21  23    Calcium 8.9 - 10.3 mg/dL 8.8  9.1  8.6    24/40/1027 biopsy of left retroperitoneal lymph node:     RADIOGRAPHIC STUDIES: I have personally reviewed the radiological images as listed and agreed with the findings in the report. CT BIOPSY  Result Date: 03/11/2023 INDICATION: 62 year old male with indeterminate retroperitoneal lymphadenopathy. EXAM: CT BIOPSY COMPARISON:  None Available. MEDICATIONS: None. ANESTHESIA/SEDATION: Fentanyl 75 mcg IV; Versed 1.5 mg IV Sedation time: 14 minutes; The patient was continuously monitored during the procedure by the interventional radiology nurse under my direct supervision. CONTRAST:  None. COMPLICATIONS: None immediate. PROCEDURE: RADIATION DOSE REDUCTION: This exam was performed according to the departmental dose-optimization program which includes automated exposure control, adjustment of the mA and/or kV according to patient size and/or use of iterative reconstruction technique. Informed consent was obtained from the patient following an explanation of the procedure, risks, benefits and alternatives. A time out was performed prior to the initiation of the procedure. The patient was positioned prone on the CT table and a limited CT was  performed for procedural planning demonstrating multifocal retroperitoneal lymphadenopathy. A left infrarenal prominent lymph node was targeted for biopsy. The procedure was planned. The operative site was prepped and draped in the usual sterile fashion. Appropriate trajectory was confirmed with a 22 gauge spinal needle after the adjacent tissues were anesthetized with 1% Lidocaine with epinephrine. Under intermittent CT  guidance, a 17 gauge coaxial needle was advanced into the peripheral aspect of the mass. Appropriate positioning was confirmed and a total of 5 samples were obtained with an 18 gauge core needle biopsy device. Specimens were split between formalin and saline. The co-axial needle was removed and hemostasis was achieved with manual compression. A limited postprocedural CT was negative for hemorrhage or additional complication. A dressing was placed. The patient tolerated the procedure well without immediate postprocedural complication. IMPRESSION: Technically successful CT guided core needle biopsy of infrarenal left retroperitoneal lymph node. Marliss Coots, MD [facility] Radiology Electronically Signed   By: Marliss Coots M.D.   On: 03/11/2023 16:48   CT Chest W Contrast  Result Date: 03/10/2023 CLINICAL DATA:  Respiratory illness EXAM: CT CHEST WITH CONTRAST TECHNIQUE: Multidetector CT imaging of the chest was performed during intravenous contrast administration. RADIATION DOSE REDUCTION: This exam was performed according to the departmental dose-optimization program which includes automated exposure control, adjustment of the mA and/or kV according to patient size and/or use of iterative reconstruction technique. CONTRAST:  75mL OMNIPAQUE IOHEXOL 300 MG/ML  SOLN COMPARISON:  Chest x-ray 03/09/2023, CT 03/09/2023 FINDINGS: Cardiovascular: Mild aortic atherosclerosis. No aneurysm. Coronary vascular calcification. Normal cardiac size. No  pericardial effusion Mediastinum/Nodes: Midline trachea. No thyroid mass. Borderline AP window lymph nodes measuring up to 10 mm. Right hilar node measuring 1.5 mm. Subcarinal lymph node measuring 16 mm. Multiple enlarged posterior mediastinal lymph nodes, measuring up to 14 mm. Lungs/Pleura: Mild emphysema. No acute airspace disease, pleural effusion or pneumothorax. 2 adjacent right lung base pulmonary nodules. Irregular 12 mm right lung base pulmonary nodule series 8, image 134, and adjacent 7 mm oval pulmonary nodule on series 8, image 132. No other suspicious pulmonary nodules. Upper Abdomen: See separately dictated abdominopelvic CT. Partially visualized retroperitoneal and upper abdominal adenopathy. Musculoskeletal: Heterogeneous sclerosis and lucency in the T1, T9 and T11 vertebral bodies with mild sclerosis at T10 and L1, consistent with skeletal metastatic disease. Mild loss of height of the T9 vertebral body. Associated right paravertebral soft tissue mass. Right paravertebral soft tissue mass also evident at T11, there may be epidural tissue at T11 as well. IMPRESSION: 1. No CT evidence for acute intrathoracic abnormality. 2. Mild emphysema. 2 adjacent right lung base pulmonary nodules measuring up to 12 mm, possible metastatic disease. 3. Mediastinal and right hilar adenopathy, suspicious for metastatic or lymphoproliferative disease. 4. Skeletal metastatic disease involving the T1, T9 and T11 vertebral bodies with mild loss of height of the T9 vertebral body. Right paravertebral soft tissue mass at T9 and T11, there may be epidural extension of tissue at T11 as well. This may be correlated with dedicated thoracic MRI. 5. Partially visualized retroperitoneal and upper abdominal adenopathy. 6. Aortic atherosclerosis. Aortic Atherosclerosis (ICD10-I70.0) and Emphysema (ICD10-J43.9). Electronically Signed   By: Jasmine Pang M.D.   On: 03/10/2023 01:49   CT ABDOMEN PELVIS W CONTRAST  Result Date:  03/09/2023 CLINICAL DATA:  Acute abdominal pain.  Vomiting. EXAM: CT ABDOMEN AND PELVIS WITH CONTRAST TECHNIQUE: Multidetector CT imaging of the abdomen and pelvis was performed using the standard protocol following bolus administration of intravenous contrast. RADIATION DOSE REDUCTION: This exam was performed according to the departmental dose-optimization program which includes automated exposure control, adjustment of the mA and/or kV according to patient size and/or use of iterative reconstruction technique. CONTRAST:  75mL OMNIPAQUE IOHEXOL 300 MG/ML  SOLN COMPARISON:  CT chest 12/07/2019, CT abdomen pelvis 09/09/2014. FINDINGS: Lower chest: 10 mm  nodule in the RIGHT lower lobe (image 8) is new from lung cancer screening CT in 2021. Thickened paraspinal pleura in the medial RIGHT lower lobe (image 1) is abnormal. Hepatobiliary: No focal hepatic lesion. Normal gallbladder. No biliary duct dilatation. Common bile duct is normal. Pancreas: Pancreas is normal. No ductal dilatation. No pancreatic inflammation. Spleen: Normal spleen Adrenals/urinary tract: Adrenal glands and kidneys are normal. The ureters and bladder normal. Stomach/Bowel: Stomach, small bowel, appendix, and cecum are normal. The colon and rectosigmoid colon are normal. Vascular/Lymphatic: Enlarged lymph node LEFT aorta at the level the kidneys measures 24 mm on image 32. Aortocaval node at the same level measures 17 mm on image 30. Enlarged lymph nodes extend into the retrocrural space. For example 17 mm retrocrural node on image 14. Large periportal lymph node measures 29 mm on image 20. No iliac lymphadenopathy. No inguinal lymphadenopathy. Reproductive: Prostate unremarkable Other: No free fluid. Musculoskeletal: Sclerotic changes in the lower thoracic spine new from comparison CT. Lytic lesion the posterior RIGHT aspect of the T11 vertebral body (image 10/301) is new from prior. IMPRESSION: 1. Upper abdominal lymphadenopathy and retrocrural  adenopathy is consistent with malignancy. 2. New sclerotic and lytic changes in the thoracic spine consistent with metastatic disease. 3. Nodule in the RIGHT lower lobe is new from prior concerning for metastatic disease. 4. Differential would include lung cancer versus lymphoma in patient with smoking history. Electronically Signed   By: Genevive Bi M.D.   On: 03/09/2023 12:45   DG Chest 2 View  Result Date: 03/09/2023 CLINICAL DATA:  Shortness of breath.  Abdominal pain.  Vomiting. EXAM: CHEST - 2 VIEW COMPARISON:  None Available. FINDINGS: The heart size and mediastinal contours are within normal limits. Both lungs are clear. The visualized skeletal structures are unremarkable. IMPRESSION: No active cardiopulmonary disease. Electronically Signed   By: Signa Kell M.D.   On: 03/09/2023 12:28    ASSESSMENT & PLAN:  62 y.o. 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  PLAN:  -CBC from 03/12/2023 showed WBC of 6.6K, hemoglobin of 8.3, and platelets of 324K. -upper endoscopy on 03/11/2023 which showed scarred mucosa in the antrum, which was biopsied; two angioectasias in the duodenum; and esophageal plaques suspicious for candidiasis.  -pathologist impression of CT abdomen scan on 03/09/2023 showed: 1. Upper abdominal lymphadenopathy and retrocrural adenopathy is consistent with malignancy. 2. New sclerotic and lytic changes in the thoracic spine consistent with metastatic disease. 3. Nodule in the RIGHT lower lobe is new from prior concerning for metastatic disease. 4. Differential would include lung cancer versus lymphoma in patient with smoking history. -pathologist impression of CT chest scan showed: 1. No CT evidence for acute intrathoracic abnormality. 2. Mild emphysema. 2 adjacent right lung base pulmonary nodules measuring up to 12 mm, possible  metastatic disease. 3. Mediastinal and right hilar adenopathy, suspicious for metastatic or lymphoproliferative disease. 4. Skeletal metastatic disease involving the T1, T9 and T11 vertebral bodies with mild loss of height of the T9 vertebral body. Right paravertebral soft tissue mass at T9 and T11, there may be epidural extension of tissue at T11 as well. This may be correlated with dedicated thoracic MRI. 5. Partially visualized retroperitoneal and upper abdominal adenopathy. 6. Aortic atherosclerosis. -discussed details of different types of lymphomas -Biopsy of one of the lymph nodes in his retroperitoneum did confirm lymphoma. 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  -Discussed that findings were concerning for Hodgkin's, but patient was also positive for CD20  -discussed that his condition is considered stage 4 disease given involvement in the bones and nodules in lung -discussed that gray-zone lymphoma is a rare entity and the guidelines for treatment would be to treat it as large B-cell lymphoma. Discussed that Hodgkin's would also be treated with that regimen, though it would not be the standard treatment.  -discussed that there are specific treatment regimens for hodgkin's and large B-cell lymphoma, and we would therefore want to be as definitive as possible.  -discussed that diffuse large B-cell lymphomas are potentially curable. There is not much data on gray zone lymphoma response, and it may require a more aggressive treatment regimen to some degree -will review CT scan images to determine whether it would be reasonably feasible to obtain an additional biopsy -discussed that we would ideally want to biopsy the most active and accessible lymph node -recommend an  additional surgical biopsy to determine his exact condition and obtaining an additional opinion from a pathologist at an academic center.  -recommend taking vitamin B complex one capsule a day OTC support blood count -recommend taking 2000 units of vitamin D -discussed that there may be a role for steroids if there are any immediate concerns, but I would be hesitant to do so as steroids may cause lymph nodes to shrink and make it more difficult to biopsy. There are no immediate concerns at this time.  -will plan for PET scan for further evaluation. Discussed that the PET scan would also allow [place] to better understand the localization of all the lymph nodes in the area.  -will order echocardiogram to determine his baseline -will plan for port placement  -if I feel based on his CT scan that it would be best to receive a biopsy from the chest, patient would be agreeable to a consult with cardiothoracic surgeon -will place referral to cardiothoracic surgeon -will order blood tests today for further evaluation -discussed that there may be a role to correct iron or vitamin D levels depending on labs -answered all of patient's and his wife's questions in detail  FOLLOW-UP: Labs today PET/CT in 5-7 days ECHO ASAP IR for portacath placement ASAP Referral to Dr Dorris Fetch for consideration of mediastinal LN biopsy IV feraheme 510mg  x 1 dose ASAP RTC with Dr Candise Che in 2 weeks  The total time spent in the appointment was *** minutes* .  All of the patient's questions were answered with apparent satisfaction. The patient knows to call the clinic with any problems, questions or concerns.   Wyvonnia Lora MD MS AAHIVMS [facility] Hematology/Oncology Physician [facility]  .*Total Encounter Time as defined by the Centers for Medicare and Medicaid Services includes, in addition to the face-to-face time of a patient visit (documented in the note above) non-face-to-face time: obtaining and  reviewing outside history, ordering and reviewing medications, tests or procedures, care coordination (communications with other health care professionals or caregivers) and documentation in the medical record.    I,Mitra Faeizi,acting as a Neurosurgeon for Wyvonnia Lora, MD.,have documented all relevant documentation on the behalf of Wyvonnia Lora, MD,as directed by  Wyvonnia Lora, MD while in the presence of Wyvonnia Lora, MD.  ***

## 2023-03-26 NOTE — Procedures (Signed)
Pre Procedure Dx: Poor venous access Post Procedural Dx: Same  Successful placement of right IJ approach port-a-cath with tip at the superior caval atrial junction. The catheter is ready for immediate use.  Estimated Blood Loss: Trace  Complications: None immediate.  Jay Haile Bosler, MD Pager #: [phone]   

## 2023-03-26 NOTE — Discharge Instructions (Signed)

## 2023-03-27 ENCOUNTER — Ambulatory Visit (HOSPITAL_COMMUNITY)
Admission: RE | Admit: 2023-03-27 | Discharge: 2023-03-27 | Disposition: A | Discharge status: Home / Self Care | Payer: 59 | Source: Ambulatory Visit | Attending: Hematology | Admitting: Hematology

## 2023-03-27 ENCOUNTER — Encounter: Payer: Self-pay | Admitting: Hematology

## 2023-03-27 ENCOUNTER — Telehealth: Payer: Self-pay | Admitting: Pharmacy Technician

## 2023-03-27 DIAGNOSIS — Z09 Encounter for follow-up examination after completed treatment for conditions other than malignant neoplasm: Secondary | ICD-10-CM | POA: Insufficient documentation

## 2023-03-27 DIAGNOSIS — Z0189 Encounter for other specified special examinations: Secondary | ICD-10-CM

## 2023-03-27 DIAGNOSIS — Q2112 Patent foramen ovale: Secondary | ICD-10-CM | POA: Insufficient documentation

## 2023-03-27 DIAGNOSIS — C8588 Other specified types of non-Hodgkin lymphoma, lymph nodes of multiple sites: Secondary | ICD-10-CM | POA: Diagnosis not present

## 2023-03-27 DIAGNOSIS — D509 Iron deficiency anemia, unspecified: Secondary | ICD-10-CM | POA: Insufficient documentation

## 2023-03-27 LAB — ECHOCARDIOGRAM COMPLETE
AR max vel: 2.45 cm2
AV Area VTI: 2.48 cm2
AV Area mean vel: 2.26 cm2
AV Mean grad: 4 mm[Hg]
AV Peak grad: 8.5 mm[Hg]
Ao pk vel: 1.46 m/s
Area-P 1/2: 1.71 cm2
Calc EF: 60 %
S' Lateral: 2.7 cm
Single Plane A2C EF: 61 %
Single Plane A4C EF: 59.1 %

## 2023-03-27 NOTE — Telephone Encounter (Signed)
Timothy Hunter, Fyi note:  Patient will be scheduled as soon as possible.  Auth Submission: NO AUTH NEEDED Site of care: Site of care: [facility] Payer: [employer] Medication & CPT/J Code(s) submitted: Venofer (Iron Sucrose) [id number] Route of submission (phone, fax, portal):  Phone # Fax # Auth type: Buy/Bill PB Units/visits requested: 5 Reference number:  Approval from: 03/27/23 to 05/13/23

## 2023-03-31 ENCOUNTER — Ambulatory Visit (HOSPITAL_COMMUNITY)
Admission: RE | Admit: 2023-03-31 | Discharge: 2023-03-31 | Disposition: A | Discharge status: Home / Self Care | Payer: 59 | Source: Ambulatory Visit | Attending: Hematology | Admitting: Hematology

## 2023-03-31 ENCOUNTER — Other Ambulatory Visit: Payer: Self-pay

## 2023-03-31 DIAGNOSIS — C8598 Non-Hodgkin lymphoma, unspecified, lymph nodes of multiple sites: Secondary | ICD-10-CM | POA: Diagnosis not present

## 2023-03-31 DIAGNOSIS — C8588 Other specified types of non-Hodgkin lymphoma, lymph nodes of multiple sites: Secondary | ICD-10-CM

## 2023-03-31 LAB — GLUCOSE, CAPILLARY: Glucose-Capillary: 125 mg/dL — ABNORMAL HIGH (ref 70–99)

## 2023-03-31 MED ORDER — FLUDEOXYGLUCOSE F - 18 (FDG) INJECTION
7.7500 | Freq: Once | INTRAVENOUS | Status: AC | PRN
  Administered 2023-03-31: 7.64 via INTRAVENOUS

## 2023-04-03 ENCOUNTER — Other Ambulatory Visit: Payer: Self-pay | Admitting: Hematology

## 2023-04-03 DIAGNOSIS — C8588 Other specified types of non-Hodgkin lymphoma, lymph nodes of multiple sites: Secondary | ICD-10-CM

## 2023-04-03 NOTE — Progress Notes (Signed)
Richarda Overlie, MD  Claudean Kinds [facility] for US guided core biopsy of right supraclavicular lymph node.  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

## 2023-04-09 ENCOUNTER — Other Ambulatory Visit: Payer: Self-pay | Admitting: Hematology

## 2023-04-09 ENCOUNTER — Other Ambulatory Visit: Payer: Self-pay

## 2023-04-09 DIAGNOSIS — D509 Iron deficiency anemia, unspecified: Secondary | ICD-10-CM

## 2023-04-09 MED ORDER — TRAMADOL HCL 50 MG PO TABS: 50.0000 mg | ORAL_TABLET | Freq: Four times a day (QID) | ORAL | 0 refills | Status: DC | PRN

## 2023-04-09 MED ORDER — PANTOPRAZOLE SODIUM 40 MG PO TBEC: 40.0000 mg | DELAYED_RELEASE_TABLET | Freq: Every day | ORAL | 2 refills | Status: DC

## 2023-04-16 ENCOUNTER — Inpatient Hospital Stay: Payer: 59 | Attending: Hematology | Admitting: Hematology

## 2023-04-16 ENCOUNTER — Ambulatory Visit (INDEPENDENT_AMBULATORY_CARE_PROVIDER_SITE_OTHER): Payer: 59

## 2023-04-16 VITALS — BP 117/74 | HR 88 | Temp 98.2°F | Resp 18 | Ht 69.0 in | Wt 150.8 lb

## 2023-04-16 VITALS — BP 124/82 | HR 110 | Temp 97.5°F | Resp 20 | Wt 151.0 lb

## 2023-04-16 DIAGNOSIS — E538 Deficiency of other specified B group vitamins: Secondary | ICD-10-CM | POA: Insufficient documentation

## 2023-04-16 DIAGNOSIS — Z9221 Personal history of antineoplastic chemotherapy: Secondary | ICD-10-CM | POA: Insufficient documentation

## 2023-04-16 DIAGNOSIS — H409 Unspecified glaucoma: Secondary | ICD-10-CM | POA: Insufficient documentation

## 2023-04-16 DIAGNOSIS — K31811 Angiodysplasia of stomach and duodenum with bleeding: Secondary | ICD-10-CM | POA: Insufficient documentation

## 2023-04-16 DIAGNOSIS — Z5112 Encounter for antineoplastic immunotherapy: Secondary | ICD-10-CM | POA: Insufficient documentation

## 2023-04-16 DIAGNOSIS — I517 Cardiomegaly: Secondary | ICD-10-CM | POA: Diagnosis not present

## 2023-04-16 DIAGNOSIS — C8178 Other classical Hodgkin lymphoma, lymph nodes of multiple sites: Secondary | ICD-10-CM | POA: Diagnosis not present

## 2023-04-16 DIAGNOSIS — Z5111 Encounter for antineoplastic chemotherapy: Secondary | ICD-10-CM | POA: Insufficient documentation

## 2023-04-16 DIAGNOSIS — Z7189 Other specified counseling: Secondary | ICD-10-CM | POA: Diagnosis not present

## 2023-04-16 DIAGNOSIS — R918 Other nonspecific abnormal finding of lung field: Secondary | ICD-10-CM | POA: Insufficient documentation

## 2023-04-16 DIAGNOSIS — R7402 Elevation of levels of lactic acid dehydrogenase (LDH): Secondary | ICD-10-CM | POA: Insufficient documentation

## 2023-04-16 DIAGNOSIS — K5900 Constipation, unspecified: Secondary | ICD-10-CM | POA: Diagnosis not present

## 2023-04-16 DIAGNOSIS — R61 Generalized hyperhidrosis: Secondary | ICD-10-CM | POA: Insufficient documentation

## 2023-04-16 DIAGNOSIS — M545 Low back pain, unspecified: Secondary | ICD-10-CM | POA: Insufficient documentation

## 2023-04-16 DIAGNOSIS — Q2112 Patent foramen ovale: Secondary | ICD-10-CM | POA: Diagnosis not present

## 2023-04-16 DIAGNOSIS — Z5189 Encounter for other specified aftercare: Secondary | ICD-10-CM | POA: Diagnosis not present

## 2023-04-16 DIAGNOSIS — D62 Acute posthemorrhagic anemia: Secondary | ICD-10-CM | POA: Diagnosis not present

## 2023-04-16 DIAGNOSIS — Z87891 Personal history of nicotine dependence: Secondary | ICD-10-CM | POA: Insufficient documentation

## 2023-04-16 DIAGNOSIS — C8330 Diffuse large B-cell lymphoma, unspecified site: Secondary | ICD-10-CM | POA: Insufficient documentation

## 2023-04-16 DIAGNOSIS — C8190 Hodgkin lymphoma, unspecified, unspecified site: Secondary | ICD-10-CM | POA: Diagnosis not present

## 2023-04-16 DIAGNOSIS — Z79630 Long term (current) use of alkylating agent: Secondary | ICD-10-CM | POA: Diagnosis not present

## 2023-04-16 DIAGNOSIS — D509 Iron deficiency anemia, unspecified: Secondary | ICD-10-CM | POA: Diagnosis not present

## 2023-04-16 DIAGNOSIS — C8198 Hodgkin lymphoma, unspecified, lymph nodes of multiple sites: Secondary | ICD-10-CM

## 2023-04-16 DIAGNOSIS — Z79620 Long term (current) use of immunosuppressive biologic: Secondary | ICD-10-CM | POA: Diagnosis not present

## 2023-04-16 DIAGNOSIS — R5383 Other fatigue: Secondary | ICD-10-CM | POA: Insufficient documentation

## 2023-04-16 DIAGNOSIS — I700 Atherosclerosis of aorta: Secondary | ICD-10-CM | POA: Insufficient documentation

## 2023-04-16 DIAGNOSIS — Z79633 Long term (current) use of mitotic inhibitor: Secondary | ICD-10-CM | POA: Insufficient documentation

## 2023-04-16 DIAGNOSIS — Z79632 Long term (current) use of antitumor antibiotic: Secondary | ICD-10-CM | POA: Diagnosis not present

## 2023-04-16 DIAGNOSIS — R21 Rash and other nonspecific skin eruption: Secondary | ICD-10-CM | POA: Diagnosis not present

## 2023-04-16 DIAGNOSIS — D630 Anemia in neoplastic disease: Secondary | ICD-10-CM | POA: Insufficient documentation

## 2023-04-16 DIAGNOSIS — Z79899 Other long term (current) drug therapy: Secondary | ICD-10-CM | POA: Insufficient documentation

## 2023-04-16 DIAGNOSIS — Z803 Family history of malignant neoplasm of breast: Secondary | ICD-10-CM | POA: Insufficient documentation

## 2023-04-16 MED ORDER — ACETAMINOPHEN 325 MG PO TABS
650.0000 mg | ORAL_TABLET | Freq: Once | ORAL | Status: AC
  Administered 2023-04-16: 650 mg via ORAL
  Filled 2023-04-16: qty 2

## 2023-04-16 MED ORDER — IRON SUCROSE 20 MG/ML IV SOLN
200.0000 mg | Freq: Once | INTRAVENOUS | Status: AC
  Administered 2023-04-16: 200 mg via INTRAVENOUS
  Filled 2023-04-16: qty 10

## 2023-04-16 MED ORDER — DIPHENHYDRAMINE HCL 25 MG PO CAPS
25.0000 mg | ORAL_CAPSULE | Freq: Once | ORAL | Status: AC
  Administered 2023-04-16: 25 mg via ORAL
  Filled 2023-04-16: qty 1

## 2023-04-16 NOTE — Progress Notes (Signed)
Diagnosis: Iron Deficiency Anemia  Provider:  Chilton Greathouse MD  Procedure: IV Push  IV Type: Peripheral, IV Location: L Forearm  Venofer (Iron Sucrose), Dose: 200 mg  Post Infusion IV Care: Observation period completed and Peripheral IV Discontinued  Discharge: Condition: Good, Destination: Home . AVS Provided  Performed by:  Marlow Baars [place], RN

## 2023-04-17 ENCOUNTER — Encounter: Payer: Self-pay | Admitting: Thoracic Surgery (Cardiothoracic Vascular Surgery)

## 2023-04-17 ENCOUNTER — Institutional Professional Consult (permissible substitution) (INDEPENDENT_AMBULATORY_CARE_PROVIDER_SITE_OTHER): Payer: 59 | Admitting: Thoracic Surgery (Cardiothoracic Vascular Surgery)

## 2023-04-17 VITALS — BP 113/76 | HR 100 | Resp 20 | Ht 68.0 in | Wt 150.0 lb

## 2023-04-17 DIAGNOSIS — C8599 Non-Hodgkin lymphoma, unspecified, extranodal and solid organ sites: Secondary | ICD-10-CM | POA: Diagnosis not present

## 2023-04-17 DIAGNOSIS — C8590 Non-Hodgkin lymphoma, unspecified, unspecified site: Secondary | ICD-10-CM | POA: Insufficient documentation

## 2023-04-17 NOTE — Progress Notes (Signed)
Patient seen by Dr Candise Che 04/16/23, scheduled to star chemotherapy on 04/23/23. Will continue to monitor.

## 2023-04-17 NOTE — Progress Notes (Signed)
Biopsys sent to [facility] for a second opinion read on 04/03/23 and consultation forms filled out and returned on 04/08/23 to [facility].

## 2023-04-17 NOTE — Progress Notes (Signed)
PCP is Patient, No Pcp Per Referring Provider is Timothy [place], MD  Chief Complaint  Patient presents with   Lymphoma    PET Scan 03/31/23/ CT CT BX 03/11/23/ Chest CT 03/10/23    HPI: Timothy Hunter is sent for consultation regarding possible lymph node biopsy.  Timothy Hunter is a 62 year old man with a past medical history significant for glaucoma, upper GI bleed, symptomatic acute blood loss anemia, and newly diagnosed lymphoma.  He presented in October with abdominal pain.  Found to have acute blood loss anemia.  He had an upper endoscopy.  As part of his workup he had a CT of the abdomen and pelvis and then subsequently a CT of the chest.  He was noted to have significant adenopathy.  He had a CT-guided biopsy of a retroperitoneal lymph node.  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  No further bleeding that he is aware off.  Has lost about 10 pounds over the past 3 months.  Complains of loss of appetite and decreased energy.  Does have some shortness of breath with exertion and pain when he coughs.   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     Past Surgical History:  Procedure Laterality Date   BIOPSY  03/11/2023   Procedure: BIOPSY;  Surgeon: Kerin Salen, MD;  Location: [facility];  Service: Gastroenterology;;   ESOPHAGOGASTRODUODENOSCOPY (EGD) WITH PROPOFOL N/A 03/11/2023   Procedure: ESOPHAGOGASTRODUODENOSCOPY (EGD) WITH PROPOFOL;  Surgeon: Kerin Salen, MD;  Location: [facility];  Service: Gastroenterology;  Laterality:  N/A;   HOT HEMOSTASIS N/A 03/11/2023   Procedure: HOT HEMOSTASIS (ARGON PLASMA COAGULATION/BICAP);  Surgeon: Kerin Salen, MD;  Location: [facility];  Service: Gastroenterology;  Laterality: N/A;   IR IMAGING GUIDED PORT INSERTION  03/26/2023    No family history on file.  Social History Social History   Tobacco Use   Smoking status: Former  Substance Use Topics   Alcohol use: Yes    Comment: occasionally   Drug use: Yes    Types: Marijuana    Current Outpatient Medications  Medication Sig Dispense Refill   feeding supplement (ENSURE ENLIVE / ENSURE PLUS) LIQD Take 237 mLs by mouth 2 (two) times daily between meals. 237 mL 12   ferrous sulfate 325 (65 FE) MG tablet Take 325 mg by mouth daily with breakfast.     latanoprost (XALATAN) 0.005 % ophthalmic solution Place 1 drop into both eyes at bedtime.     pantoprazole (PROTONIX) 40 MG tablet Take 1 tablet (40 mg total) by mouth daily before breakfast. 30 tablet 2   timolol (TIMOPTIC) 0.5 % ophthalmic solution Place 1 drop into both eyes in the morning.     traMADol (ULTRAM) 50 MG tablet Take 1 tablet (50 mg total) by mouth every 6 (six) hours as needed. 30 tablet 0   No current facility-administered medications for this visit.    No Known Allergies  Review of Systems  Constitutional:  Positive for activity change, appetite change, fatigue and unexpected weight change.  HENT:  Positive for dental problem.  Respiratory:  Positive for shortness of breath (With exertion).   Cardiovascular:  Positive for chest pain (Pleuritic with cough).  Gastrointestinal:  Positive for abdominal pain.  All other systems reviewed and are negative.   BP 113/76 (BP Location: Left Arm, Patient Position: Sitting, Cuff Size: Normal)   Pulse 100   Resp 20   Ht 5\' 8"  (1.727 m)   Wt 150 lb (68 kg)   SpO2 98% Comment: RA  BMI 22.81 kg/m  Physical Exam Vitals reviewed.  Constitutional:      General: He is not in acute distress.    Appearance:  Normal appearance. He is not ill-appearing.  HENT:     Head: Normocephalic and atraumatic.  Eyes:     General: No scleral icterus.    Extraocular Movements: Extraocular movements intact.  Cardiovascular:     Rate and Rhythm: Normal rate and regular rhythm.     Heart sounds: No murmur heard. Pulmonary:     Effort: Pulmonary effort is normal. No respiratory distress.     Breath sounds: Normal breath sounds. No wheezing.  Abdominal:     General: There is no distension.     Palpations: Abdomen is soft.  Musculoskeletal:     Cervical back: Neck supple.  Lymphadenopathy:     Cervical: Cervical adenopathy (Right supraclavicular adenopathy) present.  Skin:    General: Skin is warm and dry.  Neurological:     General: No focal deficit present.     Mental Status: He is alert and oriented to person, place, and time.     Cranial Nerves: No cranial nerve deficit.     Motor: No weakness.    Diagnostic Tests: NUCLEAR MEDICINE PET SKULL BASE TO THIGH   TECHNIQUE: 7.6 mCi F-18 FDG was injected intravenously. Full-ring PET imaging was performed from the skull base to thigh after the radiotracer. CT data was obtained and used for attenuation correction and anatomic localization.   Fasting blood glucose: 125 mg/dl   COMPARISON:  CT chest 03/10/2023 and CT abdomen pelvis 03/09/2023.   FINDINGS: Mediastinal blood pool activity: SUV max 2.3   Liver activity: SUV max 3.6   NECK:   No abnormal hypermetabolism.   Incidental CT findings:   None.   CHEST:   Hypermetabolic right supraclavicular, mediastinal and extrapleural lymph nodes. Index subcarinal lymph node measures 1.8 cm, SUV max 12.9. No additional abnormal hypermetabolism.   Incidental CT findings:   Right IJ Port-A-Cath terminates in the high right atrium. Atherosclerotic calcification of the aorta and coronary arteries. Heart is at the upper limits of normal in size to mildly enlarged. No pericardial or pleural  effusion. Right-sided paraspinal extrapleural soft tissue thickening adjacent to lytic lesions in the T9 and T11 vertebral bodies.   ABDOMEN/PELVIS:   Hypermetabolic retrocrural, periportal and abdominopelvic retroperitoneal adenopathy. Index portacaval nodal mass measures approximately 2.9 cm (4/110), SUV max 10.6. No additional abnormal hypermetabolism.   Incidental CT findings:   None.   SKELETON:   Hypermetabolic lytic lesion in the T11 vertebral body, SUV max 19.9. Additional hypermetabolic lesions in the sternum and T9 vertebral body.   Incidental CT findings:   Degenerative changes in the spine.   IMPRESSION: 1. Hypermetabolic adenopathy throughout the chest, abdomen and pelvis, with hypermetabolic osseous lesions, compatible with the provided history of non-Hodgkin lymphoma. 2. Aortic atherosclerosis (ICD10-I70.0). Coronary artery calcification.     Electronically Signed   By: Leanna Battles M.D.   On: 04/16/2023 12:57 I personally reviewed the PET/CT images.  There is extensive hypermetabolic adenopathy in the chest and right supraclavicular region.  Also abdominal and bone findings as noted by radiology.  Impression: Timothy Hunter is a 62 year old man with a past medical history significant for glaucoma, upper GI bleed, symptomatic acute blood loss anemia, and newly diagnosed lymphoma.  Lymphoma-had a CT-guided biopsy of a retroperitoneal lymph node.  Findings of non-Hodgkin's lymphoma but possible gray zone with some findings consistent with Hodgkin's.  Needs additional tissue to further clarify the diagnosis.  Reviewing his images the best option for biopsy is the right supraclavicular lymph node.  He is scheduled to have a needle biopsy of that by radiology next week.  I think that is an appropriate next step.  If for any reason that biopsy is not sufficient, the next step would be to do a surgical biopsy of that node.  Really no reason to do a  mediastinoscopy given the more accessible and less risky option of biopsying the supraclavicular node.  I described the proposed operative procedure of right supraclavicular lymph node biopsy to him.  We can do this as an outpatient using local anesthesia with IV sedation.  I informed him of the indications, risks, benefits, and alternatives.  He understands the risks include bleeding, infection, seroma, and lymphocele.  Plan: Either he or Dr. Candise Che can contact us if a surgical biopsy is needed.  Loreli Slot, MD [facility] [phone]

## 2023-04-18 ENCOUNTER — Other Ambulatory Visit: Payer: Self-pay

## 2023-04-18 DIAGNOSIS — C8588 Other specified types of non-Hodgkin lymphoma, lymph nodes of multiple sites: Secondary | ICD-10-CM

## 2023-04-21 ENCOUNTER — Inpatient Hospital Stay: Payer: 59

## 2023-04-21 ENCOUNTER — Other Ambulatory Visit: Payer: Self-pay | Admitting: Urology

## 2023-04-21 ENCOUNTER — Encounter: Payer: 59 | Admitting: Thoracic Surgery (Cardiothoracic Vascular Surgery)

## 2023-04-21 DIAGNOSIS — Z01818 Encounter for other preprocedural examination: Secondary | ICD-10-CM

## 2023-04-21 NOTE — Progress Notes (Shared)
Chief Complaint: Patient was seen in consultation today for US guided biopsy of FDG avid right supraclavicular lymph node, at the request of Dr. [name]  Supervising Physician: Irish Lack  Patient Status: [facility] - Out-pt  History of Present Illness: Timothy Hunter is a 62 y.o. male   FULL Code status per patient.  Patient is known to IR, having previously undergone a left retroperitoneal lymph node biopsy on 03/11/23 and Port-A-Cath placement on 11/13.  Timothy Hunter is a 62 year old man with a past medical history significant for glaucoma, upper GI bleed, symptomatic acute blood loss anemia, and newly diagnosed stage IV B cell lymphoma. He is and ex-smoker.  He presented in October with abdominal pain and found to have acute blood loss anemia.  He had an upper endoscopy.  As part of his workup he had a CT of the abdomen and pelvis and then subsequently a CT of the chest.  He was noted to have significant adenopathy.  He had a CT-guided biopsy of a retroperitoneal lymph node.  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  However, additional tissue is requested to further clarify the diagnosis. Per Dr. Dorris Fetch, the best option for biopsy is the right supraclavicular lymph node. If for any reason that biopsy is not sufficient, the next step would be to do a surgical biopsy of that node.  IR was requested for right supraclavicular lymph node biopsy. Dr. Lowella Dandy reviewed and approved request. Patient is scheduled for same in IR today.  Patient currently denies fever, HA, CP, cough, abd/back pain, N/V or bleeding. He does have some chronic dyspnea.     Past Medical History:  Diagnosis Date   Dyspnea    Glaucoma associated with anomalies of iris     Past Surgical History:  Procedure Laterality Date   BIOPSY  03/11/2023   Procedure: BIOPSY;  Surgeon: Kerin Salen, MD;  Location: [facility]  ENDOSCOPY;  Service: Gastroenterology;;   ESOPHAGOGASTRODUODENOSCOPY (EGD) WITH PROPOFOL N/A 03/11/2023   Procedure: ESOPHAGOGASTRODUODENOSCOPY (EGD) WITH PROPOFOL;  Surgeon: Kerin Salen, MD;  Location: [facility] ENDOSCOPY;  Service: Gastroenterology;  Laterality: N/A;   HOT HEMOSTASIS N/A 03/11/2023   Procedure: HOT HEMOSTASIS (ARGON PLASMA COAGULATION/BICAP);  Surgeon: Kerin Salen, MD;  Location: [facility];  Service: Gastroenterology;  Laterality: N/A;   IR IMAGING GUIDED PORT INSERTION  03/26/2023    Allergies: Patient has no known allergies.  Medications: Prior to Admission medications   Medication Sig Start Date End Date Taking? Authorizing Provider  feeding supplement (ENSURE ENLIVE / ENSURE PLUS) LIQD Take 237 mLs by mouth 2 (two) times daily between meals. 03/12/23   Kathlen Mody, MD  ferrous sulfate 325 (65 FE) MG tablet Take 325 mg by mouth daily with breakfast.    [provider]  latanoprost (XALATAN) 0.005 % ophthalmic solution Place 1 drop into both eyes at bedtime. 10/01/20   [provider]  pantoprazole (PROTONIX) 40 MG tablet Take 1 tablet (40 mg total) by mouth daily before breakfast. 04/09/23   [name], MD  timolol (TIMOPTIC) 0.5 % ophthalmic solution Place 1 drop into both eyes in the morning. 10/01/20   [provider]  traMADol (ULTRAM) 50 MG tablet Take 1 tablet (50 mg total) by mouth every 6 (six) hours as needed. 04/09/23   [name], MD     No family history on file.  Social History   Socioeconomic History   Marital status: Widowed  Spouse name: Not on file   Number of children: Not on file   Years of education: Not on file   Highest education level: Not on file  Occupational History   Not on file  Tobacco Use   Smoking status: Former   Smokeless tobacco: Not on file  Substance and Sexual Activity   Alcohol use: Yes    Comment: occasionally   Drug use: Yes    Types: Marijuana   Sexual activity: Not on  file  Other Topics Concern   Not on file  Social History Narrative   Not on file   Social Determinants of Health   Financial Resource Strain: Not on file  Food Insecurity: No Food Insecurity (03/09/2023)   Hunger Vital Sign    Worried About Running Out of Food in the Last Year: Never true    Ran Out of Food in the Last Year: Never true  Transportation Needs: No Transportation Needs (03/09/2023)   PRAPARE - Administrator, Civil Service (Medical): No    Lack of Transportation (Non-Medical): No  Physical Activity: Not on file  Stress: Not on file  Social Connections: Not on file    Review of Systems: A 12 point ROS discussed and pertinent positives are indicated in the HPI above.  All other systems are negative.  Review of Systems  Vital Signs: There were no vitals taken for this visit.    Physical Exam  Imaging: NM PET Image Initial (PI) Skull Base To Thigh  Result Date: 04/16/2023 CLINICAL DATA:  Initial treatment strategy for non-Hodgkin lymphoma. EXAM: NUCLEAR MEDICINE PET SKULL BASE TO THIGH TECHNIQUE: 7.6 mCi F-18 FDG was injected intravenously. Full-ring PET imaging was performed from the skull base to thigh after the radiotracer. CT data was obtained and used for attenuation correction and anatomic localization. Fasting blood glucose: 125 mg/dl COMPARISON:  CT chest 62/13/0865 and CT abdomen pelvis 03/09/2023. FINDINGS: Mediastinal blood pool activity: SUV max 2.3 Liver activity: SUV max 3.6 NECK: No abnormal hypermetabolism. Incidental CT findings: None. CHEST: Hypermetabolic right supraclavicular, mediastinal and extrapleural lymph nodes. Index subcarinal lymph node measures 1.8 cm, SUV max 12.9. No additional abnormal hypermetabolism. Incidental CT findings: Right IJ Port-A-Cath terminates in the high right atrium. Atherosclerotic calcification of the aorta and coronary arteries. Heart is at the upper limits of normal in size to mildly enlarged. No pericardial  or pleural effusion. Right-sided paraspinal extrapleural soft tissue thickening adjacent to lytic lesions in the T9 and T11 vertebral bodies. ABDOMEN/PELVIS: Hypermetabolic retrocrural, periportal and abdominopelvic retroperitoneal adenopathy. Index portacaval nodal mass measures approximately 2.9 cm (4/110), SUV max 10.6. No additional abnormal hypermetabolism. Incidental CT findings: None. SKELETON: Hypermetabolic lytic lesion in the T11 vertebral body, SUV max 19.9. Additional hypermetabolic lesions in the sternum and T9 vertebral body. Incidental CT findings: Degenerative changes in the spine. IMPRESSION: 1. Hypermetabolic adenopathy throughout the chest, abdomen and pelvis, with hypermetabolic osseous lesions, compatible with the provided history of non-Hodgkin lymphoma. 2. Aortic atherosclerosis (ICD10-I70.0). Coronary artery calcification. Electronically Signed   By: Leanna Battles M.D.   On: 04/16/2023 12:57   ECHOCARDIOGRAM COMPLETE  Result Date: 03/27/2023    ECHOCARDIOGRAM REPORT   Patient Name:   Timothy Hunter Date of Exam: 03/27/2023 Medical Rec #:  [id number]          Height:       69.0 in Accession #:    [id number]         Weight:  154.0 lb Date of Birth:  Jun 30, 1960          BSA:          1.849 m Patient Age:    62 years           BP:           139/86 mmHg Patient Gender: M                  HR:           71 bpm. Exam Location:  Outpatient Procedure: 2D Echo, Color Doppler and Cardiac Doppler Indications:    Z09 Chemo  History:        Patient has no prior history of Echocardiogram examinations. No                 cardiac history.  Sonographer:    L. Thornton-Maynard Referring Phys: [id number] [name]  Sonographer Comments: Image acquisition challenging due to respiratory motion and Image acquisition challenging due to patient body habitus. Global longitudinal strain was attempted. IMPRESSIONS  1. Left ventricular ejection fraction, by estimation, is 60 to 65%. The left  ventricle has normal function. The left ventricle has no regional wall motion abnormalities. Left ventricular diastolic parameters were normal.  2. Right ventricular systolic function is normal. The right ventricular size is normal. There is normal pulmonary artery systolic pressure. The estimated right ventricular systolic pressure is 25.7 mmHg.  3. The mitral valve is grossly normal. Trivial mitral valve regurgitation. No evidence of mitral stenosis.  4. The aortic valve is tricuspid. Aortic valve regurgitation is not visualized. No aortic stenosis is present.  5. The inferior vena cava is normal in size with greater than 50% respiratory variability, suggesting right atrial pressure of 3 mmHg.  6. Evidence of atrial level shunting detected by color flow Doppler. There is a small patent foramen ovale with predominantly left to right shunting across the atrial septum. FINDINGS  Left Ventricle: Left ventricular ejection fraction, by estimation, is 60 to 65%. The left ventricle has normal function. The left ventricle has no regional wall motion abnormalities. Global longitudinal strain performed but not reported based on interpreter judgement due to suboptimal tracking. The left ventricular internal cavity size was normal in size. There is no left ventricular hypertrophy. Left ventricular diastolic parameters were normal. Right Ventricle: The right ventricular size is normal. No increase in right ventricular wall thickness. Right ventricular systolic function is normal. There is normal pulmonary artery systolic pressure. The tricuspid regurgitant velocity is 2.38 m/s, and  with an assumed right atrial pressure of 3 mmHg, the estimated right ventricular systolic pressure is 25.7 mmHg. Left Atrium: Left atrial size was normal in size. Right Atrium: Right atrial size was normal in size. Pericardium: There is no evidence of pericardial effusion. Mitral Valve: The mitral valve is grossly normal. Trivial mitral valve  regurgitation. No evidence of mitral valve stenosis. Tricuspid Valve: The tricuspid valve is grossly normal. Tricuspid valve regurgitation is trivial. No evidence of tricuspid stenosis. Aortic Valve: The aortic valve is tricuspid. Aortic valve regurgitation is not visualized. No aortic stenosis is present. Aortic valve mean gradient measures 4.0 mmHg. Aortic valve peak gradient measures 8.5 mmHg. Aortic valve area, by VTI measures 2.48 cm. Pulmonic Valve: The pulmonic valve was grossly normal. Pulmonic valve regurgitation is not visualized. No evidence of pulmonic stenosis. Aorta: The aortic root and ascending aorta are structurally normal, with no evidence of dilitation. Venous: The inferior vena cava is normal in  size with greater than 50% respiratory variability, suggesting right atrial pressure of 3 mmHg. IAS/Shunts: Evidence of atrial level shunting detected by color flow Doppler. A small patent foramen ovale is detected with predominantly left to right shunting across the atrial septum.  LEFT VENTRICLE PLAX 2D LVIDd:         4.10 cm     Diastology LVIDs:         2.70 cm     LV e' medial:    6.96 cm/s LV PW:         1.10 cm     LV E/e' medial:  7.5 LV IVS:        1.00 cm     LV e' lateral:   9.79 cm/s LVOT diam:     2.20 cm     LV E/e' lateral: 5.3 LV SV:         63 LV SV Index:   34 LVOT Area:     3.80 cm  LV Volumes (MOD) LV vol d, MOD A2C: 94.6 ml LV vol d, MOD A4C: 87.6 ml LV vol s, MOD A2C: 36.9 ml LV vol s, MOD A4C: 35.8 ml LV SV MOD A2C:     57.7 ml LV SV MOD A4C:     87.6 ml LV SV MOD BP:      55.4 ml RIGHT VENTRICLE             IVC RV Basal diam:  2.50 cm     IVC diam: 1.30 cm RV S prime:     15.10 cm/s TAPSE (M-mode): 2.3 cm LEFT ATRIUM             Index        RIGHT ATRIUM           Index LA diam:        2.50 cm 1.35 cm/m   RA Area:     13.90 cm LA Vol (A2C):   44.9 ml 24.29 ml/m  RA Volume:   31.40 ml  16.98 ml/m LA Vol (A4C):   35.7 ml 19.31 ml/m LA Biplane Vol: 40.8 ml 22.07 ml/m  AORTIC  VALVE                    PULMONIC VALVE AV Area (Vmax):    2.45 cm     PV Vmax:       0.84 m/s AV Area (Vmean):   2.26 cm     PV Peak grad:  2.8 mmHg AV Area (VTI):     2.48 cm AV Vmax:           146.00 cm/s AV Vmean:          95.000 cm/s AV VTI:            0.253 m AV Peak Grad:      8.5 mmHg AV Mean Grad:      4.0 mmHg LVOT Vmax:         94.00 cm/s LVOT Vmean:        56.600 cm/s LVOT VTI:          0.165 m LVOT/AV VTI ratio: 0.65  AORTA Ao Root diam: 3.30 cm Ao Asc diam:  3.40 cm MITRAL VALVE               TRICUSPID VALVE MV Area (PHT): 1.71 cm    TR Peak grad:   22.7 mmHg MV Decel Time: 443 msec    TR Vmax:  238.00 cm/s MV E velocity: 52.10 cm/s MV A velocity: 47.60 cm/s  SHUNTS MV E/A ratio:  1.09        Systemic VTI:  0.16 m                            Systemic Diam: 2.20 cm Lennie Odor MD Electronically signed by Lennie Odor MD Signature Date/Time: 03/27/2023/1:40:49 PM    Final    IR IMAGING GUIDED PORT INSERTION  Result Date: 03/27/2023 INDICATION: New diagnosis of lymphoma. In need of durable intravenous access for chemotherapy administration. EXAM: IMPLANTED PORT A CATH PLACEMENT WITH ULTRASOUND AND FLUOROSCOPIC GUIDANCE COMPARISON:  Chest CT-02/18/2023 MEDICATIONS: None ANESTHESIA/SEDATION: Moderate (conscious) sedation was employed during this procedure as administered by the Interventional Radiology RN. A total of Versed 2 mg and Fentanyl 100 mcg was administered intravenously. Moderate Sedation Time: 25 minutes. The patient's level of consciousness and vital signs were monitored continuously by radiology nursing throughout the procedure under my direct supervision. CONTRAST:  None FLUOROSCOPY TIME:  24 seconds (1 mGy) COMPLICATIONS: None immediate. PROCEDURE: The procedure, risks, benefits, and alternatives were explained to the patient. Questions regarding the procedure were encouraged and answered. The patient understands and consents to the procedure. The right neck and chest were  prepped with chlorhexidine in a sterile fashion, and a sterile drape was applied covering the operative field. Maximum barrier sterile technique with sterile gowns and gloves were used for the procedure. A timeout was performed prior to the initiation of the procedure. Local anesthesia was provided with 1% lidocaine with epinephrine. After creating a small venotomy incision, a micropuncture kit was utilized to access the internal jugular vein. Real-time ultrasound guidance was utilized for vascular access including the acquisition of a permanent ultrasound image documenting patency of the accessed vessel. The microwire was utilized to measure appropriate catheter length. A subcutaneous port pocket was then created along the upper chest wall utilizing a combination of sharp and blunt dissection. The pocket was irrigated with sterile saline. A single lumen [employer] power injectable port was chosen for placement. The 8 Fr catheter was tunneled from the port pocket site to the venotomy incision. The port was placed in the pocket. The external catheter was trimmed to appropriate length. At the venotomy, an 8 Fr peel-away sheath was placed over a guidewire under fluoroscopic guidance. The catheter was then placed through the sheath and the sheath was removed. Final catheter positioning was confirmed and documented with a fluoroscopic spot radiograph. The port was accessed with a Huber needle, aspirated and flushed with heparinized saline. The venotomy site was closed with an interrupted 4-0 Vicryl suture. The port pocket incision was closed with interrupted 2-0 Vicryl suture. Dermabond and Steri-strips were applied to both incisions. Dressings were applied. The patient tolerated the procedure well without immediate post procedural complication. FINDINGS: After catheter placement, the tip lies within the superior cavoatrial junction. The catheter aspirates and flushes normally and is ready for immediate use.  IMPRESSION: Successful placement of a right internal jugular approach power injectable Port-A-Cath. The catheter is ready for immediate use. Electronically Signed   By: Simonne Come M.D.   On: 03/27/2023 09:52    Labs:  CBC: Recent Labs    03/10/23 0425 03/11/23 0428 03/11/23 1422 03/12/23 0416 03/20/23 1455  WBC 6.6 7.2  --  6.6 5.6  HGB 6.7* 8.6* 8.4* 8.3* 8.4*  HCT 24.2* 29.0* 29.3* 29.5* 28.6*  PLT 348 342  --  324 389    COAGS: Recent Labs    03/09/23 1058 03/11/23 0428  INR 1.2 1.2    BMP: Recent Labs    03/10/23 0425 03/11/23 0428 03/12/23 0416 03/20/23 1455  NA 137 140 137 137  K 3.4* 3.6 3.5 3.5  CL 108 108 107 101  CO2 23 21* 20* 29  GLUCOSE 112* 112* 113* 111*  BUN 7* 8 10 11   CALCIUM 8.6* 9.1 8.8* 9.6  CREATININE 0.82 0.77 0.65 1.04  GFRNONAA >60 >60 >60 >60    LIVER FUNCTION TESTS: Recent Labs    03/09/23 1058 03/11/23 0428 03/20/23 1455  BILITOT 0.5  --  0.3  AST 15  --  15  ALT 11  --  12  ALKPHOS 59  --  62  PROT 8.6*  --  9.1*  ALBUMIN 3.4* 3.2* 3.9    TUMOR MARKERS: No results for input(s): "AFPTM", "CEA", "CA199", "CHROMGRNA" in the last 8760 hours.  Assessment and Plan:  Patient is known to IR, having previously undergone a left retroperitoneal lymph node biopsy on 03/11/23 and Port-A-Cath placement on 11/13.  He presented in October with abdominal pain and found to have acute blood loss anemia.  Imaging workup at that time was concerning for metastatic disease. Subsequent biopsy showed a "gray zone" lymphoma with large cell B-cell population and features concerning for Hodgkin's disease.  However, additional tissue is requested to further clarify the diagnosis.   Patient presents for scheduled right supraclavicular lymph node biopsy in IR today.  Risks and benefits of right supraclavicular lymph node biopsy was discussed with the patient and/or patient's family including, but not limited to bleeding, infection, damage to adjacent  structures or low yield requiring additional tests.  All of the questions were answered and there is agreement to proceed.  Consent signed and in chart.   Thank you for this interesting consult.  I greatly enjoyed meeting Timothy Hunter and look forward to participating in their care.  A copy of this report was sent to the requesting provider on this date.  Electronically Signed: Sable Feil, PA-C 04/21/2023, 2:44 PM   I spent a total of    15 Minutes in face to face in clinical consultation, greater than 50% of which was counseling/coordinating care for right supraclavicular lymph node biopsy

## 2023-04-21 NOTE — Progress Notes (Signed)
[facility] Clinical Social Work  Initial Assessment   Timothy Hunter is a 62 y.o. year old male contacted by phone. Clinical Social Work was referred by nurse for assessment of psychosocial needs.   SDOH (Social Determinants of Health) assessments performed: No   SDOH Screenings   Food Insecurity: No Food Insecurity (03/09/2023)  Housing: Low Risk  (03/09/2023)  Transportation Needs: No Transportation Needs (03/09/2023)  Utilities: Not At Risk (03/09/2023)  Depression (PHQ2-9): Low Risk  (04/16/2023)  Tobacco Use: Medium Risk (04/17/2023)     Distress Screen completed: No     No data to display            Family/Social Information:  Housing Arrangement: patient lives with his spouse. Family members/support persons in your life? Family Transportation concerns: no, patient is being transported by his spouse.Patient feels that he will be able to transport himself if needed.  Employment: Retired in July 2024.  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 no, patient has upcoming appointments and uncertain of when chemotherapy will begin. Concerns about diagnosis and/or treatment: Overwhelmed by information, Quality of life, and impact on spouse Patient reported stressors: Therapist, art and/or priorities: To get through treatment Current coping skills/ strengths: Ability for insight , Active sense of humor , Average or above average intelligence , Capable of independent living , Communication skills , General fund of knowledge , Motivation for treatment/growth , and Supportive family/friends     SUMMARY: Current SDOH Barriers:  Financial  constraints related to Medical Bills  Clinical Social Work Clinical Goal(s):  Explore community resource options for unmet needs related to:  Food Insecurity   Interventions: Discussed common feeling and emotions when being diagnosed with cancer, and the importance of support during treatment Informed patient of the support team roles and support services at [facility] Provided CSW contact information and encouraged patient to call with any questions or concerns Referred patient to Cancer Care and  Lymphoma  / Leukemia Society.    Follow Up Plan: Patient will contact CSW with any support or resource needs Patient verbalizes understanding of plan: Yes  Marguerita Merles, LCSW Clinical Social Worker [facility]

## 2023-04-22 ENCOUNTER — Ambulatory Visit (HOSPITAL_COMMUNITY)
Admission: RE | Admit: 2023-04-22 | Discharge: 2023-04-22 | Disposition: A | Discharge status: Home / Self Care | Payer: 59 | Source: Ambulatory Visit | Attending: Hematology

## 2023-04-22 ENCOUNTER — Other Ambulatory Visit: Payer: Self-pay

## 2023-04-22 ENCOUNTER — Ambulatory Visit (HOSPITAL_COMMUNITY)
Admission: RE | Admit: 2023-04-22 | Discharge: 2023-04-22 | Disposition: A | Discharge status: Home / Self Care | Payer: 59 | Source: Ambulatory Visit | Attending: Hematology | Admitting: Hematology

## 2023-04-22 ENCOUNTER — Encounter (HOSPITAL_COMMUNITY): Payer: Self-pay

## 2023-04-22 DIAGNOSIS — Z01818 Encounter for other preprocedural examination: Secondary | ICD-10-CM

## 2023-04-22 DIAGNOSIS — C8518 Unspecified B-cell lymphoma, lymph nodes of multiple sites: Secondary | ICD-10-CM | POA: Insufficient documentation

## 2023-04-22 DIAGNOSIS — C8510 Unspecified B-cell lymphoma, unspecified site: Secondary | ICD-10-CM | POA: Diagnosis not present

## 2023-04-22 DIAGNOSIS — C8590 Non-Hodgkin lymphoma, unspecified, unspecified site: Secondary | ICD-10-CM | POA: Diagnosis present

## 2023-04-22 DIAGNOSIS — R599 Enlarged lymph nodes, unspecified: Secondary | ICD-10-CM | POA: Diagnosis not present

## 2023-04-22 DIAGNOSIS — C8588 Other specified types of non-Hodgkin lymphoma, lymph nodes of multiple sites: Secondary | ICD-10-CM

## 2023-04-22 DIAGNOSIS — C8511 Unspecified B-cell lymphoma, lymph nodes of head, face, and neck: Secondary | ICD-10-CM | POA: Diagnosis not present

## 2023-04-22 LAB — CBC
HCT: 30.3 % — ABNORMAL LOW (ref 39.0–52.0)
Hemoglobin: 8.6 g/dL — ABNORMAL LOW (ref 13.0–17.0)
MCH: 20.5 pg — ABNORMAL LOW (ref 26.0–34.0)
MCHC: 28.4 g/dL — ABNORMAL LOW (ref 30.0–36.0)
MCV: 72.1 fL — ABNORMAL LOW (ref 80.0–100.0)
Platelets: 340 10*3/uL (ref 150–400)
RBC: 4.2 MIL/uL — ABNORMAL LOW (ref 4.22–5.81)
RDW: 25.5 % — ABNORMAL HIGH (ref 11.5–15.5)
WBC: 8.5 10*3/uL (ref 4.0–10.5)
nRBC: 0 % (ref 0.0–0.2)

## 2023-04-22 LAB — PROTIME-INR
INR: 1.2 (ref 0.8–1.2)
Prothrombin Time: 15.6 s — ABNORMAL HIGH (ref 11.4–15.2)

## 2023-04-22 MED ORDER — HEPARIN SOD (PORK) LOCK FLUSH 100 UNIT/ML IV SOLN
500.0000 [IU] | Freq: Once | INTRAVENOUS | Status: AC
  Administered 2023-04-22: 500 [IU] via INTRAVENOUS
  Filled 2023-04-22: qty 5

## 2023-04-22 MED ORDER — SODIUM CHLORIDE 0.9% FLUSH
3.0000 mL | Freq: Two times a day (BID) | INTRAVENOUS | Status: DC
  Administered 2023-04-22: 3 mL via INTRAVENOUS

## 2023-04-22 MED ORDER — NALOXONE HCL 0.4 MG/ML IJ SOLN
INTRAMUSCULAR | Status: AC
  Filled 2023-04-22: qty 1

## 2023-04-22 MED ORDER — MIDAZOLAM HCL 2 MG/2ML IJ SOLN
INTRAMUSCULAR | Status: AC
  Filled 2023-04-22: qty 2

## 2023-04-22 MED ORDER — FLUMAZENIL 0.5 MG/5ML IV SOLN
INTRAVENOUS | Status: AC
  Filled 2023-04-22: qty 5

## 2023-04-22 MED ORDER — SODIUM CHLORIDE 0.9 % IV SOLN: INTRAVENOUS | Status: DC

## 2023-04-22 MED ORDER — FENTANYL CITRATE (PF) 100 MCG/2ML IJ SOLN
INTRAMUSCULAR | Status: AC | PRN
  Administered 2023-04-22 (×2): 50 ug via INTRAVENOUS

## 2023-04-22 MED ORDER — LIDOCAINE HCL 1 % IJ SOLN
INTRAMUSCULAR | Status: AC
  Filled 2023-04-22: qty 20

## 2023-04-22 MED ORDER — FENTANYL CITRATE (PF) 100 MCG/2ML IJ SOLN
INTRAMUSCULAR | Status: AC
  Filled 2023-04-22: qty 2

## 2023-04-22 MED ORDER — MIDAZOLAM HCL 2 MG/2ML IJ SOLN
INTRAMUSCULAR | Status: AC | PRN
  Administered 2023-04-22 (×2): 1 mg via INTRAVENOUS

## 2023-04-22 NOTE — Discharge Instructions (Signed)
Please call Interventional Radiology clinic [phone] with any questions or concerns.  You may remove your dressing and shower tomorrow.  After the procedure, it is common to have: Soreness Bruising Mild pain  Follow these instructions at home:  Medication: Do not use Aspirin or ibuprofen products, such as Advil or Motrin, as it may increase bleeding  You may resume your usual medications as ordered by your doctor If your doctor prescribed antibiotics, take them as directed. Do not stop taking them just because you feel better. You need to take the full course of antibiotics  Eating and drinking: Drink plenty of liquids to keep your urine pale yellow You can resume your regular diet as directed by your doctor   Care of the procedure site Wash your hands with soap and water before you change your bandage (dressing). If you cannot use soap and water, use hand sanitizer. Check your puncture site every day for signs of infection. Check for: Redness, swelling, or pain Fluid or blood Pus or a bad smell Warmth Activity Do not take baths, swim, or use a hot tub until your health care provider approves  Keep all follow-up visits as told by your doctor  Contact a doctor if you have: A fever Redness, swelling, or pain at the puncture site, and it lasts longer than a few days Fluid, blood, or pus coming from the puncture site Warmth coming from the puncture site  Get help right away if: You have a lot of bleeding from the puncture site   Moderate Conscious Sedation-Care After   This sheet gives you information about how to care for yourself after your procedure. Your health care provider may also give you more specific instructions. If you have problems or questions, contact your health care provider.  After the procedure, it is common to have: Sleepiness for several hours. Impaired judgment for several hours. Difficulty with balance. Vomiting if you eat too soon.  Follow  these instructions at home:  Rest. Do not participate in activities where you could fall or become injured. Do not drive or use machinery. Do not drink alcohol. Do not take sleeping pills or medicines that cause drowsiness. Do not make important decisions or sign legal documents. Do not take care of children on your own.  Eating and drinking Follow the diet recommended by your health care provider. Drink enough fluid to keep your urine pale yellow. If you vomit: Drink water, juice, or soup when you can drink without vomiting. Make sure you have little or no nausea before eating solid foods.  General instructions Take over-the-counter and prescription medicines only as told by your health care provider. Have a responsible adult stay with you for the time you are told. It is important to have someone help care for you until you are awake and alert. Do not smoke. Keep all follow-up visits as told by your health care provider. This is important.  Contact a health care provider if: You are still sleepy or having trouble with balance after 24 hours. You feel light-headed. You keep feeling nauseous or you keep vomiting. You develop a rash. You have a fever. You have redness or swelling around the IV site.  Get help right away if: You have trouble breathing. You have new-onset confusion at home.  This information is not intended to replace advice given to you by your health care provider. Make sure you discuss any questions you have with your healthcare provider.

## 2023-04-22 NOTE — Procedures (Signed)
Interventional Radiology Procedure Note  Procedure: US Guided Biopsy of right supraclavicular lymph node  Complications: None  Estimated Blood Loss: < 10 mL  Findings: 18 G core biopsy of right supraclavicular lymph node performed under US guidance.  Four core samples obtained and sent to Pathology.  Jodi Marble. Fredia Sorrow, M.D Pager:  [phone]

## 2023-04-23 ENCOUNTER — Ambulatory Visit (INDEPENDENT_AMBULATORY_CARE_PROVIDER_SITE_OTHER): Payer: 59

## 2023-04-23 ENCOUNTER — Encounter: Payer: Self-pay | Admitting: Hematology

## 2023-04-23 VITALS — BP 122/82 | HR 81 | Temp 98.7°F | Resp 16 | Ht 69.0 in | Wt 154.6 lb

## 2023-04-23 DIAGNOSIS — C8190 Hodgkin lymphoma, unspecified, unspecified site: Secondary | ICD-10-CM | POA: Insufficient documentation

## 2023-04-23 DIAGNOSIS — D509 Iron deficiency anemia, unspecified: Secondary | ICD-10-CM | POA: Diagnosis not present

## 2023-04-23 MED ORDER — DEXAMETHASONE 4 MG PO TABS: ORAL_TABLET | ORAL | 1 refills | Status: DC

## 2023-04-23 MED ORDER — ONDANSETRON HCL 8 MG PO TABS: 8.0000 mg | ORAL_TABLET | Freq: Three times a day (TID) | ORAL | 1 refills | Status: DC | PRN

## 2023-04-23 MED ORDER — ACETAMINOPHEN 325 MG PO TABS
650.0000 mg | ORAL_TABLET | Freq: Once | ORAL | Status: AC
  Administered 2023-04-23: 650 mg via ORAL
  Filled 2023-04-23: qty 2

## 2023-04-23 MED ORDER — DIPHENHYDRAMINE HCL 25 MG PO CAPS
25.0000 mg | ORAL_CAPSULE | Freq: Once | ORAL | Status: AC
  Administered 2023-04-23: 25 mg via ORAL
  Filled 2023-04-23: qty 1

## 2023-04-23 MED ORDER — IRON SUCROSE 20 MG/ML IV SOLN
200.0000 mg | Freq: Once | INTRAVENOUS | Status: AC
Start: 2023-04-23 — End: 2023-04-23
  Administered 2023-04-23: 200 mg via INTRAVENOUS
  Filled 2023-04-23: qty 10

## 2023-04-23 MED ORDER — PROCHLORPERAZINE MALEATE 10 MG PO TABS: 10.0000 mg | ORAL_TABLET | Freq: Four times a day (QID) | ORAL | 1 refills | Status: DC | PRN

## 2023-04-23 MED ORDER — LIDOCAINE-PRILOCAINE 2.5-2.5 % EX CREA: TOPICAL_CREAM | CUTANEOUS | 3 refills | Status: DC

## 2023-04-23 NOTE — Progress Notes (Signed)
HEMATOLOGY/ONCOLOGY CLINIC NOTE  Date of Service: .04/16/2023   Patient Care Team: Patient, No Pcp Per as PCP - General (General Practice)  CHIEF COMPLAINTS/PURPOSE OF CONSULTATION:  F/u for recently diagnosed Hodgkins lymphoma  HISTORY OF PRESENTING ILLNESS:   Timothy Hunter is a wonderful 62 y.o. male who has been referred to [place] by Dr. 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   Patient presented to the ED on 03/09/2023 for GI bleed/acute blood loss anemia/microcytic anemia/low vitamin B12 levels and was seen by Dr. Pamelia Hoit.   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   Today, he is accompanied by his wife. He reports that he was recently seen in the hospital for anemia, with abdominal pain and black stools. His hgb was in 6s at the time. Patient received a CT scan of the abdomen and chest and was found to have several enlarged lymph glands in the center of his chest, including lung nodules and findings in the upper abdomen, bone, and thoracic spine. A biopsy of lymph node in retroperitoneum did show concerns for lymphoma.  Patient reports that he has otherwise been generally been pretty healthy otherwise. He notes taking medication for arthritis and reports a medical history of glaucoma. He denies any other chronic medical issues and does not take any medications on a regular basis otherwise. Patient denies having any surgeries in the past.   He reports that his lower back was evaluated earlier this year due to lower back pain, and there were not any concerning findings at that time. He denies any pain in the middle back. His back pain has improved recently. His wife  reports that his energy level has improved recently.   Patient does not smoke cigarettes at this time. He previously smoked 2 packs a day since he was 62 years old until about 2010. He reports that he rarely consumes alcohol and only consumes alcohol with social use. Patient does smoke marijuana.  There is a fhx of cancer. His wife reports that he has had family members that have passed away from cancer, but she is unsure of the type of cancer they had. His wife reports that patient's younger and older sister do have a history of cancer, and are in remission at this time. His older sister has breast cancer and breast cancer does run in the family. Patient denies any other medical issues in the family. He denies any blood disorders in the family.   Patient reports that he has not endorsed any black stools since being home from the hospital, and denies any other changes in bowel habits.   Patient's wife reports that patient did receive a blood transfusion while in the hospital, as well as potassium, but does not believe that patient received IV iron. His B12 level was also low at that time. He is not on any iron or B12 replacement at this time.   He denies having any other symptoms at this time. Patient denies any lightheadedness/dizziness, SOB, chest pain, abdominal pain, or new leg swelling.  His wife reports that he did take one Tramadol for back pain prevention. He did not have any back pain prior to taking it.   He reports that he has lost 12 pounds over the course of  about 3 months. Patient reports that his eating habits are normal at this time. He does drink two Ensure protein drinks a day and has gained 5-6 pounds recently. He reports that he was not put on any steroids recently.   Patient denies new lumps/bumps. His wife reports that patient has a scar on right forehead, which has been present for a while. He denies any swelling in the groin or testicular pain/swelling.   INTERVAL  HISTORY  .Discussed the use of AI scribe software for clinical note transcription with the patient, who gave verbal consent to proceed.  The patient, diagnosed with two types of lymphomas, has undergone several diagnostic procedures since the last consultation, including a PET scan, echocardiogram, and port placement. The pathology report from [facility] suggests a more definitive diagnosis of Hodgkin's lymphoma, with a less likely possibility of the rarer gray zone lymphoma. The patient's blood tests show a high sedimentation rate, a marker typically elevated in Hodgkin's lymphoma, while the LDH level is normal.  The patient has lymph nodes and involvement in various locations, not predominantly in the chest, which is atypical for gray zone lymphomas. A sizable lymph node under the collarbone was identified on the PET scan, which could be biopsied for further certainty.  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 The patient has also been found to be vitamin B12 deficient and is currently taking a B complex supplement.  The patient reports fatigue, which is expected to improve with the correction of anemia and iron deficiency. He also reports lower back pain, which is more pronounced when lying down and turning over. The pain is managed with tramadol as needed. The patient has been experiencing night sweats and itching, particularly on the back where a rash has developed. These symptoms are expected to improve with the initiation of treatment.  The patient has a port placed for treatment administration, which has healed well since placement about three weeks ago. The patient's heart function is normal, as indicated by an echocardiogram showing a normal ejection fraction. The patient has not reported any blood in the stools, but has experienced constipation, likely due to iron pills and tramadol.  The patient's PET scan  shows several areas of disease, similar to what was seen on the CT scan.      MEDICAL HISTORY:  Past Medical History:  Diagnosis Date   Dyspnea    Glaucoma associated with anomalies of iris     SURGICAL HISTORY: Past Surgical History:  Procedure Laterality Date   BIOPSY  03/11/2023   Procedure: BIOPSY;  Surgeon: Kerin Salen, MD;  Location: [facility];  Service: Gastroenterology;;   ESOPHAGOGASTRODUODENOSCOPY (EGD) WITH PROPOFOL N/A 03/11/2023   Procedure: ESOPHAGOGASTRODUODENOSCOPY (EGD) WITH PROPOFOL;  Surgeon: Kerin Salen, MD;  Location: [facility];  Service: Gastroenterology;  Laterality: N/A;   HOT HEMOSTASIS N/A 03/11/2023   Procedure: HOT HEMOSTASIS (ARGON PLASMA COAGULATION/BICAP);  Surgeon: Kerin Salen, MD;  Location: [facility];  Service: Gastroenterology;  Laterality: N/A;   IR IMAGING GUIDED PORT INSERTION  03/26/2023    SOCIAL HISTORY: Social History   Socioeconomic History   Marital status: Widowed    Spouse name: Not on file   Number of children: Not on file   Years of education: Not on file   Highest education level: Not on file  Occupational History   Not on file  Tobacco Use   Smoking status: Former    Passive exposure: Never  Smokeless tobacco: Not on file  Vaping Use   Vaping status: Never Used  Substance and Sexual Activity   Alcohol use: Yes    Comment: occasionally   Drug use: Yes    Types: Marijuana   Sexual activity: Not on file  Other Topics Concern   Not on file  Social History Narrative   Not on file   Social Determinants of Health   Financial Resource Strain: Not on file  Food Insecurity: No Food Insecurity (03/09/2023)   Hunger Vital Sign    Worried About Running Out of Food in the Last Year: Never true    Ran Out of Food in the Last Year: Never true  Transportation Needs: No Transportation Needs (03/09/2023)   PRAPARE - Administrator, Civil Service (Medical): No    Lack of Transportation (Non-Medical): No   Physical Activity: Not on file  Stress: Not on file  Social Connections: Not on file  Intimate Partner Violence: Not At Risk (03/09/2023)   Humiliation, Afraid, Rape, and Kick questionnaire    Fear of Current or Ex-Partner: No    Emotionally Abused: No    Physically Abused: No    Sexually Abused: No    FAMILY HISTORY: No family history on file.  ALLERGIES:  has No Known Allergies.  MEDICATIONS:  Current Outpatient Medications  Medication Sig Dispense Refill   calcium-vitamin D (OSCAL WITH D) 500-5 MG-MCG tablet Take 4 tablets by mouth daily.     Cyanocobalamin (VITAMIN B-12 PO) Take 1 Dose by mouth daily.     feeding supplement (ENSURE ENLIVE / ENSURE PLUS) LIQD Take 237 mLs by mouth 2 (two) times daily between meals. 237 mL 12   ferrous sulfate 325 (65 FE) MG tablet Take 325 mg by mouth daily with breakfast.     latanoprost (XALATAN) 0.005 % ophthalmic solution Place 1 drop into both eyes at bedtime.     pantoprazole (PROTONIX) 40 MG tablet Take 1 tablet (40 mg total) by mouth daily before breakfast. 30 tablet 2   timolol (TIMOPTIC) 0.5 % ophthalmic solution Place 1 drop into both eyes in the morning.     traMADol (ULTRAM) 50 MG tablet Take 1 tablet (50 mg total) by mouth every 6 (six) hours as needed. 30 tablet 0   No current facility-administered medications for this visit.   Facility-Administered Medications Ordered in Other Visits  Medication Dose Route Frequency Provider Last Rate Last Admin   sodium chloride flush (NS) 0.9 % injection 3 mL  3 mL Intravenous Q12H Irish Lack, MD   3 mL at 04/22/23 1424    REVIEW OF SYSTEMS:    .10 Point review of Systems was done is negative except as noted above.   PHYSICAL EXAMINATION: ECOG PERFORMANCE STATUS: 2 - Symptomatic, <50% confined to bed  . Vitals:   04/16/23 1217  BP: 124/82  Pulse: (!) 110  Resp: 20  Temp: (!) 97.5 F (36.4 C)  SpO2: 100%   Filed Weights   04/16/23 1217  Weight: 151 lb (68.5 kg)    .Body mass index is 22.3 kg/m.  Marland Kitchen GENERAL:alert, in no acute distress and comfortable SKIN: no acute rashes, no significant lesions EYES: conjunctiva are pink and non-injected, sclera anicteric OROPHARYNX: MMM, no exudates, no oropharyngeal erythema or ulceration NECK: supple, no JVD LYMPH:  no palpable lymphadenopathy in the cervical, axillary or inguinal regions LUNGS: clear to auscultation b/l with normal respiratory effort HEART: regular rate & rhythm ABDOMEN:  normoactive bowel sounds ,  non tender, not distended. Extremity: no pedal edema PSYCH: alert & oriented x 3 with fluent speech NEURO: no focal motor/sensory deficits   LABORATORY DATA:  I have reviewed the data as listed  .    Latest Ref Rng & Units 04/22/2023   11:28 AM 03/20/2023    2:55 PM 03/12/2023    4:16 AM  CBC  WBC 4.0 - 10.5 K/uL 8.5  5.6  6.6   Hemoglobin 13.0 - 17.0 g/dL 8.6  8.4  8.3   Hematocrit 39.0 - 52.0 % 30.3  28.6  29.5   Platelets 150 - 400 K/uL 340  389  324     .    Latest Ref Rng & Units 03/20/2023    2:55 PM 03/12/2023    4:16 AM 03/11/2023    4:28 AM  CMP  Glucose 70 - 99 mg/dL 161  096  045   BUN 8 - 23 mg/dL 11  10  8    Creatinine 0.61 - 1.24 mg/dL 4.09  8.11  9.14   Sodium 135 - 145 mmol/L 137  137  140   Potassium 3.5 - 5.1 mmol/L 3.5  3.5  3.6   Chloride 98 - 111 mmol/L 101  107  108   CO2 22 - 32 mmol/L 29  20  21    Calcium 8.9 - 10.3 mg/dL 9.6  8.8  9.1   Total Protein 6.5 - 8.1 g/dL 9.1     Total Bilirubin <1.2 mg/dL 0.3     Alkaline Phos 38 - 126 U/L 62     AST 15 - 41 U/L 15     ALT 0 - 44 U/L 12      03/11/2023 biopsy of left retroperitoneal lymph node:     RADIOGRAPHIC STUDIES: I have personally reviewed the radiological images as listed and agreed with the findings in the report. [place] CORE BIOPSY (LYMPH NODES)  Result Date: 04/22/2023 INDICATION: B-cell lymphoma with prior biopsy of retroperitoneal lymph node on 03/11/2023. Additional molecular analysis  requested via biopsy of a hypermetabolic, enlarged right supraclavicular lymph node. EXAM: ULTRASOUND GUIDED CORE BIOPSY OF RIGHT SUPRACLAVICULAR LYMPH NODE MEDICATIONS: None. ANESTHESIA/SEDATION: Moderate (conscious) sedation was employed during this procedure. A total of Versed 2.0 mg and Fentanyl 100 mcg was administered intravenously. Moderate Sedation Time: 15 minutes. The patient's level of consciousness and vital signs were monitored continuously by radiology nursing throughout the procedure under my direct supervision. PROCEDURE: The procedure, risks, benefits, and alternatives were explained to the patient. Questions regarding the procedure were encouraged and answered. The patient understands and consents to the procedure. A time-out was performed prior to initiating the procedure. The right neck was prepped with chlorhexidine in a sterile fashion, and a sterile drape was applied covering the operative field. A sterile gown and sterile gloves were used for the procedure. Local anesthesia was provided with 1% Lidocaine. After localizing an enlarged right supraclavicular lymph node, an 18 gauge core biopsy device was utilized in obtaining 4 separate samples through different portions of the lymph node. Core biopsy samples were submitted in saline. Additional ultrasound was performed. COMPLICATIONS: None immediate. FINDINGS: Enlarged right supraclavicular lymph node measures approximately 2.5 x 1.4 x 1.8 cm. Solid core biopsy samples were obtained. IMPRESSION: Ultrasound-guided core biopsy performed of an enlarged right supraclavicular lymph node. Electronically Signed   By: Irish Lack M.D.   On: 04/22/2023 13:39   NM PET Image Initial (PI) Skull Base To Thigh  Result Date: 04/16/2023 CLINICAL DATA:  Initial treatment strategy for non-Hodgkin lymphoma. EXAM: NUCLEAR MEDICINE PET SKULL BASE TO THIGH TECHNIQUE: 7.6 mCi F-18 FDG was injected intravenously. Full-ring PET imaging was performed from the  skull base to thigh after the radiotracer. CT data was obtained and used for attenuation correction and anatomic localization. Fasting blood glucose: 125 mg/dl COMPARISON:  CT chest 29/56/2130 and CT abdomen pelvis 03/09/2023. FINDINGS: Mediastinal blood pool activity: SUV max 2.3 Liver activity: SUV max 3.6 NECK: No abnormal hypermetabolism. Incidental CT findings: None. CHEST: Hypermetabolic right supraclavicular, mediastinal and extrapleural lymph nodes. Index subcarinal lymph node measures 1.8 cm, SUV max 12.9. No additional abnormal hypermetabolism. Incidental CT findings: Right IJ Port-A-Cath terminates in the high right atrium. Atherosclerotic calcification of the aorta and coronary arteries. Heart is at the upper limits of normal in size to mildly enlarged. No pericardial or pleural effusion. Right-sided paraspinal extrapleural soft tissue thickening adjacent to lytic lesions in the T9 and T11 vertebral bodies. ABDOMEN/PELVIS: Hypermetabolic retrocrural, periportal and abdominopelvic retroperitoneal adenopathy. Index portacaval nodal mass measures approximately 2.9 cm (4/110), SUV max 10.6. No additional abnormal hypermetabolism. Incidental CT findings: None. SKELETON: Hypermetabolic lytic lesion in the T11 vertebral body, SUV max 19.9. Additional hypermetabolic lesions in the sternum and T9 vertebral body. Incidental CT findings: Degenerative changes in the spine. IMPRESSION: 1. Hypermetabolic adenopathy throughout the chest, abdomen and pelvis, with hypermetabolic osseous lesions, compatible with the provided history of non-Hodgkin lymphoma. 2. Aortic atherosclerosis (ICD10-I70.0). Coronary artery calcification. Electronically Signed   By: Leanna Battles M.D.   On: 04/16/2023 12:57   ECHOCARDIOGRAM COMPLETE  Result Date: 03/27/2023    ECHOCARDIOGRAM REPORT   Patient Name:   CHEICK VERT Date of Exam: 03/27/2023 Medical Rec #:  [id number]          Height:       69.0 in Accession #:    [id number]          Weight:       154.0 lb Date of Birth:  [birth year]          BSA:          1.849 m Patient Age:    62 years           BP:           139/86 mmHg Patient Gender: M                  HR:           71 bpm. Exam Location:  Outpatient Procedure: 2D Echo, Color Doppler and Cardiac Doppler Indications:    Z09 Chemo  History:        Patient has no prior history of Echocardiogram examinations. No                 cardiac history.  Sonographer:    L. Thornton-Maynard Referring Phys: [id number] [name]  Sonographer Comments: Image acquisition challenging due to respiratory motion and Image acquisition challenging due to patient body habitus. Global longitudinal strain was attempted. IMPRESSIONS  1. Left ventricular ejection fraction, by estimation, is 60 to 65%. The left ventricle has normal function. The left ventricle has no regional wall motion abnormalities. Left ventricular diastolic parameters were normal.  2. Right ventricular systolic function is normal. The right ventricular size is normal. There is normal pulmonary artery systolic pressure. The estimated right ventricular systolic pressure is 25.7 mmHg.  3. The mitral valve is grossly normal. Trivial mitral valve regurgitation. No evidence of mitral stenosis.  4.  The aortic valve is tricuspid. Aortic valve regurgitation is not visualized. No aortic stenosis is present.  5. The inferior vena cava is normal in size with greater than 50% respiratory variability, suggesting right atrial pressure of 3 mmHg.  6. Evidence of atrial level shunting detected by color flow Doppler. There is a small patent foramen ovale with predominantly left to right shunting across the atrial septum. FINDINGS  Left Ventricle: Left ventricular ejection fraction, by estimation, is 60 to 65%. The left ventricle has normal function. The left ventricle has no regional wall motion abnormalities. Global longitudinal strain performed but not reported based on interpreter judgement due to  suboptimal tracking. The left ventricular internal cavity size was normal in size. There is no left ventricular hypertrophy. Left ventricular diastolic parameters were normal. Right Ventricle: The right ventricular size is normal. No increase in right ventricular wall thickness. Right ventricular systolic function is normal. There is normal pulmonary artery systolic pressure. The tricuspid regurgitant velocity is 2.38 m/s, and  with an assumed right atrial pressure of 3 mmHg, the estimated right ventricular systolic pressure is 25.7 mmHg. Left Atrium: Left atrial size was normal in size. Right Atrium: Right atrial size was normal in size. Pericardium: There is no evidence of pericardial effusion. Mitral Valve: The mitral valve is grossly normal. Trivial mitral valve regurgitation. No evidence of mitral valve stenosis. Tricuspid Valve: The tricuspid valve is grossly normal. Tricuspid valve regurgitation is trivial. No evidence of tricuspid stenosis. Aortic Valve: The aortic valve is tricuspid. Aortic valve regurgitation is not visualized. No aortic stenosis is present. Aortic valve mean gradient measures 4.0 mmHg. Aortic valve peak gradient measures 8.5 mmHg. Aortic valve area, by VTI measures 2.48 cm. Pulmonic Valve: The pulmonic valve was grossly normal. Pulmonic valve regurgitation is not visualized. No evidence of pulmonic stenosis. Aorta: The aortic root and ascending aorta are structurally normal, with no evidence of dilitation. Venous: The inferior vena cava is normal in size with greater than 50% respiratory variability, suggesting right atrial pressure of 3 mmHg. IAS/Shunts: Evidence of atrial level shunting detected by color flow Doppler. A small patent foramen ovale is detected with predominantly left to right shunting across the atrial septum.  LEFT VENTRICLE PLAX 2D LVIDd:         4.10 cm     Diastology LVIDs:         2.70 cm     LV e' medial:    6.96 cm/s LV PW:         1.10 cm     LV E/e' medial:  7.5  LV IVS:        1.00 cm     LV e' lateral:   9.79 cm/s LVOT diam:     2.20 cm     LV E/e' lateral: 5.3 LV SV:         63 LV SV Index:   34 LVOT Area:     3.80 cm  LV Volumes (MOD) LV vol d, MOD A2C: 94.6 ml LV vol d, MOD A4C: 87.6 ml LV vol s, MOD A2C: 36.9 ml LV vol s, MOD A4C: 35.8 ml LV SV MOD A2C:     57.7 ml LV SV MOD A4C:     87.6 ml LV SV MOD BP:      55.4 ml RIGHT VENTRICLE             IVC RV Basal diam:  2.50 cm     IVC diam: 1.30 cm RV S prime:  15.10 cm/s TAPSE (M-mode): 2.3 cm LEFT ATRIUM             Index        RIGHT ATRIUM           Index LA diam:        2.50 cm 1.35 cm/m   RA Area:     13.90 cm LA Vol (A2C):   44.9 ml 24.29 ml/m  RA Volume:   31.40 ml  16.98 ml/m LA Vol (A4C):   35.7 ml 19.31 ml/m LA Biplane Vol: 40.8 ml 22.07 ml/m  AORTIC VALVE                    PULMONIC VALVE AV Area (Vmax):    2.45 cm     PV Vmax:       0.84 m/s AV Area (Vmean):   2.26 cm     PV Peak grad:  2.8 mmHg AV Area (VTI):     2.48 cm AV Vmax:           146.00 cm/s AV Vmean:          95.000 cm/s AV VTI:            0.253 m AV Peak Grad:      8.5 mmHg AV Mean Grad:      4.0 mmHg LVOT Vmax:         94.00 cm/s LVOT Vmean:        56.600 cm/s LVOT VTI:          0.165 m LVOT/AV VTI ratio: 0.65  AORTA Ao Root diam: 3.30 cm Ao Asc diam:  3.40 cm MITRAL VALVE               TRICUSPID VALVE MV Area (PHT): 1.71 cm    TR Peak grad:   22.7 mmHg MV Decel Time: 443 msec    TR Vmax:        238.00 cm/s MV E velocity: 52.10 cm/s MV A velocity: 47.60 cm/s  SHUNTS MV E/A ratio:  1.09        Systemic VTI:  0.16 m                            Systemic Diam: 2.20 cm Lennie Odor MD Electronically signed by Lennie Odor MD Signature Date/Time: 03/27/2023/1:40:49 PM    Final    IR IMAGING GUIDED PORT INSERTION  Result Date: 03/27/2023 INDICATION: New diagnosis of lymphoma. In need of durable intravenous access for chemotherapy administration. EXAM: IMPLANTED PORT A CATH PLACEMENT WITH ULTRASOUND AND FLUOROSCOPIC GUIDANCE  COMPARISON:  Chest CT-02/18/2023 MEDICATIONS: None ANESTHESIA/SEDATION: Moderate (conscious) sedation was employed during this procedure as administered by the Interventional Radiology RN. A total of Versed 2 mg and Fentanyl 100 mcg was administered intravenously. Moderate Sedation Time: 25 minutes. The patient's level of consciousness and vital signs were monitored continuously by radiology nursing throughout the procedure under my direct supervision. CONTRAST:  None FLUOROSCOPY TIME:  24 seconds (1 mGy) COMPLICATIONS: None immediate. PROCEDURE: The procedure, risks, benefits, and alternatives were explained to the patient. Questions regarding the procedure were encouraged and answered. The patient understands and consents to the procedure. The right neck and chest were prepped with chlorhexidine in a sterile fashion, and a sterile drape was applied covering the operative field. Maximum barrier sterile technique with sterile gowns and gloves were used for the procedure. A timeout was performed prior to the initiation  of the procedure. Local anesthesia was provided with 1% lidocaine with epinephrine. After creating a small venotomy incision, a micropuncture kit was utilized to access the internal jugular vein. Real-time ultrasound guidance was utilized for vascular access including the acquisition of a permanent ultrasound image documenting patency of the accessed vessel. The microwire was utilized to measure appropriate catheter length. A subcutaneous port pocket was then created along the upper chest wall utilizing a combination of sharp and blunt dissection. The pocket was irrigated with sterile saline. A single lumen [employer] power injectable port was chosen for placement. The 8 Fr catheter was tunneled from the port pocket site to the venotomy incision. The port was placed in the pocket. The external catheter was trimmed to appropriate length. At the venotomy, an 8 Fr peel-away sheath was placed over a  guidewire under fluoroscopic guidance. The catheter was then placed through the sheath and the sheath was removed. Final catheter positioning was confirmed and documented with a fluoroscopic spot radiograph. The port was accessed with a Huber needle, aspirated and flushed with heparinized saline. The venotomy site was closed with an interrupted 4-0 Vicryl suture. The port pocket incision was closed with interrupted 2-0 Vicryl suture. Dermabond and Steri-strips were applied to both incisions. Dressings were applied. The patient tolerated the procedure well without immediate post procedural complication. FINDINGS: After catheter placement, the tip lies within the superior cavoatrial junction. The catheter aspirates and flushes normally and is ready for immediate use. IMPRESSION: Successful placement of a right internal jugular approach power injectable Port-A-Cath. The catheter is ready for immediate use. Electronically Signed   By: Simonne Come M.D.   On: 03/27/2023 09:52    ASSESSMENT & PLAN:  62 y.o. male with:  Newly diagnosed stage IV Hodgkins lymphoma [id number]+ve  PLAN:    Hodgkin's Lymphoma Diagnosis confirmed by pathology from [facility] and supported by elevated sedimentation rate. PET scan shows several areas of disease, including a sizable lymph node under the collarbone. -Plan for needle biopsy of lymph node under collarbone. -Start AAVD chemotherapy regimen, including brentuximab. -Repeat PET scan after 2-3 cycles of treatment.  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 Currently on oral iron supplementation, but this is causing gastrointestinal side effects and is not sufficient to correct deficiency in a timely manner. -Start IV iron infusions, with a total of five doses planned. -Discontinue oral iron supplementation.  Vitamin B12 Deficiency Currently taking B complex supplement. -Add additional B12 supplementation at a dose of  1000 micrograms daily. -Continue B complex supplement.  General Health Maintenance -Attend chemotherapy education session. -Observe stools for signs of blood, discontinue oral iron to aid in this assessment. -Report any signs of infection promptly due to risk from chemotherapy. -Plan for follow-up in one month.      FOLLOW-UP: -F/u per scheduled appointment for US guided core needle biopsy of rt supraclavicular LN -chemo-counseling for A-AVD for [facility] F/u fr IV Iron as scheduled -Start A-AVD in 1-2 weeks with portflush and labs and MD visit  .The total time spent in the appointment was 45 minutes* .  All of the patient's questions were answered with apparent satisfaction. The patient knows to call the clinic with any problems, questions or concerns.   Wyvonnia Lora MD MS AAHIVMS [facility] Hematology/Oncology Physician [facility]  .*Total Encounter Time as defined by the Centers for Medicare and Medicaid Services includes, in addition to the face-to-face time of a patient visit (documented in  the note above) non-face-to-face time: obtaining and reviewing outside history, ordering and reviewing medications, tests or procedures, care coordination (communications with other health care professionals or caregivers) and documentation in the medical record.

## 2023-04-23 NOTE — Progress Notes (Signed)
 Diagnosis: Iron Deficiency Anemia  Provider:  Chilton Greathouse MD  Procedure: IV Push  IV Type: Peripheral, IV Location: L Antecubital  Venofer (Iron Sucrose), Dose: 200 mg  Post Infusion IV Care: Observation period completed and Peripheral IV Discontinued  Discharge: Condition: Good, Destination: Home . AVS Provided  Performed by:  Rico Ala, LPN

## 2023-04-23 NOTE — Progress Notes (Signed)
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

## 2023-04-24 ENCOUNTER — Other Ambulatory Visit: Payer: Self-pay

## 2023-04-24 LAB — SURGICAL PATHOLOGY

## 2023-04-26 ENCOUNTER — Other Ambulatory Visit: Payer: Self-pay

## 2023-04-28 ENCOUNTER — Other Ambulatory Visit: Payer: Self-pay

## 2023-04-28 ENCOUNTER — Inpatient Hospital Stay: Payer: 59

## 2023-04-28 MED ORDER — ACETAMINOPHEN 325 MG PO TABS
650.0000 mg | ORAL_TABLET | Freq: Once | ORAL | Status: AC
  Administered 2023-04-29: 650 mg via ORAL
  Filled 2023-04-28: qty 2

## 2023-04-28 MED ORDER — IRON SUCROSE 20 MG/ML IV SOLN
200.0000 mg | Freq: Once | INTRAVENOUS | Status: AC
  Administered 2023-04-29: 200 mg via INTRAVENOUS
  Filled 2023-04-28: qty 10

## 2023-04-28 MED ORDER — DIPHENHYDRAMINE HCL 25 MG PO CAPS
25.0000 mg | ORAL_CAPSULE | Freq: Once | ORAL | Status: AC
  Administered 2023-04-29: 25 mg via ORAL
  Filled 2023-04-28: qty 1

## 2023-04-29 ENCOUNTER — Ambulatory Visit (INDEPENDENT_AMBULATORY_CARE_PROVIDER_SITE_OTHER): Payer: 59

## 2023-04-29 VITALS — BP 125/83 | HR 89 | Temp 98.2°F | Resp 16 | Ht 69.0 in | Wt 151.4 lb

## 2023-04-29 DIAGNOSIS — D509 Iron deficiency anemia, unspecified: Secondary | ICD-10-CM

## 2023-04-29 MED FILL — Fosaprepitant Dimeglumine For IV Infusion 150 MG (Base Eq): INTRAVENOUS | Qty: 5 | Status: AC

## 2023-04-29 NOTE — Progress Notes (Signed)
 Diagnosis: Iron Deficiency Anemia  Provider:  Chilton Greathouse MD  Procedure: IV Push  IV Type: Peripheral, IV Location: L Forearm  Venofer (Iron Sucrose), Dose: 200 mg  Post Infusion IV Care: Observation period completed and Peripheral IV Discontinued  Discharge: Condition: Good, Destination: Home . AVS Declined  Performed by:  Loney Hering, LPN

## 2023-04-30 ENCOUNTER — Other Ambulatory Visit: Payer: Self-pay

## 2023-04-30 ENCOUNTER — Inpatient Hospital Stay: Payer: 59

## 2023-04-30 ENCOUNTER — Inpatient Hospital Stay (HOSPITAL_BASED_OUTPATIENT_CLINIC_OR_DEPARTMENT_OTHER): Payer: 59 | Admitting: Hematology

## 2023-04-30 VITALS — BP 139/73 | HR 99 | Temp 97.7°F | Resp 20 | Wt 151.3 lb

## 2023-04-30 VITALS — BP 109/64 | HR 79 | Temp 98.1°F

## 2023-04-30 DIAGNOSIS — Z79633 Long term (current) use of mitotic inhibitor: Secondary | ICD-10-CM | POA: Diagnosis not present

## 2023-04-30 DIAGNOSIS — D62 Acute posthemorrhagic anemia: Secondary | ICD-10-CM | POA: Diagnosis not present

## 2023-04-30 DIAGNOSIS — C8198 Hodgkin lymphoma, unspecified, lymph nodes of multiple sites: Secondary | ICD-10-CM

## 2023-04-30 DIAGNOSIS — C8178 Other classical Hodgkin lymphoma, lymph nodes of multiple sites: Secondary | ICD-10-CM

## 2023-04-30 DIAGNOSIS — R21 Rash and other nonspecific skin eruption: Secondary | ICD-10-CM | POA: Diagnosis not present

## 2023-04-30 DIAGNOSIS — E538 Deficiency of other specified B group vitamins: Secondary | ICD-10-CM | POA: Diagnosis not present

## 2023-04-30 DIAGNOSIS — Z5111 Encounter for antineoplastic chemotherapy: Secondary | ICD-10-CM | POA: Diagnosis not present

## 2023-04-30 DIAGNOSIS — Z79620 Long term (current) use of immunosuppressive biologic: Secondary | ICD-10-CM | POA: Diagnosis not present

## 2023-04-30 DIAGNOSIS — R61 Generalized hyperhidrosis: Secondary | ICD-10-CM | POA: Diagnosis not present

## 2023-04-30 DIAGNOSIS — Z5112 Encounter for antineoplastic immunotherapy: Secondary | ICD-10-CM | POA: Diagnosis not present

## 2023-04-30 DIAGNOSIS — C8330 Diffuse large B-cell lymphoma, unspecified site: Secondary | ICD-10-CM | POA: Diagnosis not present

## 2023-04-30 DIAGNOSIS — K31811 Angiodysplasia of stomach and duodenum with bleeding: Secondary | ICD-10-CM | POA: Diagnosis not present

## 2023-04-30 DIAGNOSIS — I700 Atherosclerosis of aorta: Secondary | ICD-10-CM | POA: Diagnosis not present

## 2023-04-30 DIAGNOSIS — R5383 Other fatigue: Secondary | ICD-10-CM | POA: Diagnosis not present

## 2023-04-30 DIAGNOSIS — C8190 Hodgkin lymphoma, unspecified, unspecified site: Secondary | ICD-10-CM | POA: Diagnosis not present

## 2023-04-30 DIAGNOSIS — K5900 Constipation, unspecified: Secondary | ICD-10-CM | POA: Diagnosis not present

## 2023-04-30 DIAGNOSIS — H409 Unspecified glaucoma: Secondary | ICD-10-CM | POA: Diagnosis not present

## 2023-04-30 DIAGNOSIS — Z5189 Encounter for other specified aftercare: Secondary | ICD-10-CM | POA: Diagnosis not present

## 2023-04-30 DIAGNOSIS — I517 Cardiomegaly: Secondary | ICD-10-CM | POA: Diagnosis not present

## 2023-04-30 DIAGNOSIS — R7402 Elevation of levels of lactic acid dehydrogenase (LDH): Secondary | ICD-10-CM | POA: Diagnosis not present

## 2023-04-30 DIAGNOSIS — Z79899 Other long term (current) drug therapy: Secondary | ICD-10-CM | POA: Diagnosis not present

## 2023-04-30 DIAGNOSIS — Z79630 Long term (current) use of alkylating agent: Secondary | ICD-10-CM | POA: Diagnosis not present

## 2023-04-30 DIAGNOSIS — Z95828 Presence of other vascular implants and grafts: Secondary | ICD-10-CM | POA: Insufficient documentation

## 2023-04-30 DIAGNOSIS — R918 Other nonspecific abnormal finding of lung field: Secondary | ICD-10-CM | POA: Diagnosis not present

## 2023-04-30 DIAGNOSIS — M545 Low back pain, unspecified: Secondary | ICD-10-CM | POA: Diagnosis not present

## 2023-04-30 DIAGNOSIS — Q2112 Patent foramen ovale: Secondary | ICD-10-CM | POA: Diagnosis not present

## 2023-04-30 DIAGNOSIS — Z79632 Long term (current) use of antitumor antibiotic: Secondary | ICD-10-CM | POA: Diagnosis not present

## 2023-04-30 LAB — CBC WITH DIFFERENTIAL (CANCER CENTER ONLY)
Abs Immature Granulocytes: 0.03 10*3/uL (ref 0.00–0.07)
Basophils Absolute: 0 10*3/uL (ref 0.0–0.1)
Basophils Relative: 0 %
Eosinophils Absolute: 0.1 10*3/uL (ref 0.0–0.5)
Eosinophils Relative: 1 %
HCT: 28 % — ABNORMAL LOW (ref 39.0–52.0)
Hemoglobin: 8.2 g/dL — ABNORMAL LOW (ref 13.0–17.0)
Immature Granulocytes: 0 %
Lymphocytes Relative: 11 %
Lymphs Abs: 1.1 10*3/uL (ref 0.7–4.0)
MCH: 20.8 pg — ABNORMAL LOW (ref 26.0–34.0)
MCHC: 29.3 g/dL — ABNORMAL LOW (ref 30.0–36.0)
MCV: 71.1 fL — ABNORMAL LOW (ref 80.0–100.0)
Monocytes Absolute: 0.5 10*3/uL (ref 0.1–1.0)
Monocytes Relative: 5 %
Neutro Abs: 8.8 10*3/uL — ABNORMAL HIGH (ref 1.7–7.7)
Neutrophils Relative %: 83 %
Platelet Count: 364 10*3/uL (ref 150–400)
RBC: 3.94 MIL/uL — ABNORMAL LOW (ref 4.22–5.81)
RDW: 25.4 % — ABNORMAL HIGH (ref 11.5–15.5)
WBC Count: 10.7 10*3/uL — ABNORMAL HIGH (ref 4.0–10.5)
nRBC: 0 % (ref 0.0–0.2)

## 2023-04-30 LAB — CMP (CANCER CENTER ONLY)
ALT: 13 U/L (ref 0–44)
AST: 12 U/L — ABNORMAL LOW (ref 15–41)
Albumin: 3.7 g/dL (ref 3.5–5.0)
Alkaline Phosphatase: 69 U/L (ref 38–126)
Anion gap: 7 (ref 5–15)
BUN: 8 mg/dL (ref 8–23)
CO2: 26 mmol/L (ref 22–32)
Calcium: 9.3 mg/dL (ref 8.9–10.3)
Chloride: 103 mmol/L (ref 98–111)
Creatinine: 0.85 mg/dL (ref 0.61–1.24)
GFR, Estimated: >60 mL/min (ref >60)
Glucose, Bld: 136 mg/dL — ABNORMAL HIGH (ref 70–99)
Potassium: 3.4 mmol/L — ABNORMAL LOW (ref 3.5–5.1)
Sodium: 136 mmol/L (ref 135–145)
Total Bilirubin: 0.3 mg/dL (ref <1.2)
Total Protein: 8.1 g/dL (ref 6.5–8.1)

## 2023-04-30 MED ORDER — SODIUM CHLORIDE 0.9 % IV SOLN
375.0000 mg/m2 | Freq: Once | INTRAVENOUS | Status: AC
  Administered 2023-04-30: 680 mg via INTRAVENOUS
  Filled 2023-04-30: qty 68

## 2023-04-30 MED ORDER — ACETAMINOPHEN 325 MG PO TABS
650.0000 mg | ORAL_TABLET | Freq: Once | ORAL | Status: AC
  Administered 2023-04-30: 650 mg via ORAL
  Filled 2023-04-30: qty 2

## 2023-04-30 MED ORDER — PALONOSETRON HCL INJECTION 0.25 MG/5ML
0.2500 mg | Freq: Once | INTRAVENOUS | Status: AC
  Administered 2023-04-30: 0.25 mg via INTRAVENOUS
  Filled 2023-04-30: qty 5

## 2023-04-30 MED ORDER — POTASSIUM CHLORIDE CRYS ER 20 MEQ PO TBCR
20.0000 meq | EXTENDED_RELEASE_TABLET | Freq: Once | ORAL | Status: AC
  Administered 2023-04-30: 20 meq via ORAL
  Filled 2023-04-30: qty 1

## 2023-04-30 MED ORDER — VINBLASTINE SULFATE CHEMO INJECTION 1 MG/ML
6.0000 mg/m2 | Freq: Once | INTRAVENOUS | Status: AC
  Administered 2023-04-30: 10.9 mg via INTRAVENOUS
  Filled 2023-04-30: qty 10.9

## 2023-04-30 MED ORDER — HEPARIN SOD (PORK) LOCK FLUSH 100 UNIT/ML IV SOLN
500.0000 [IU] | Freq: Once | INTRAVENOUS | Status: AC | PRN
Start: 2023-04-30 — End: 2023-04-30
  Administered 2023-04-30: 500 [IU]

## 2023-04-30 MED ORDER — SODIUM CHLORIDE 0.9% FLUSH
10.0000 mL | Freq: Once | INTRAVENOUS | Status: AC
  Administered 2023-04-30: 10 mL

## 2023-04-30 MED ORDER — DEXAMETHASONE SODIUM PHOSPHATE 10 MG/ML IJ SOLN
10.0000 mg | Freq: Once | INTRAMUSCULAR | Status: AC
  Administered 2023-04-30: 10 mg via INTRAVENOUS
  Filled 2023-04-30: qty 1

## 2023-04-30 MED ORDER — SODIUM CHLORIDE 0.9 % IV SOLN: INTRAVENOUS | Status: DC

## 2023-04-30 MED ORDER — DIPHENHYDRAMINE HCL 50 MG/ML IJ SOLN
50.0000 mg | Freq: Once | INTRAMUSCULAR | Status: AC
  Administered 2023-04-30: 50 mg via INTRAVENOUS
  Filled 2023-04-30: qty 1

## 2023-04-30 MED ORDER — DOXORUBICIN HCL CHEMO IV INJECTION 2 MG/ML
25.0000 mg/m2 | Freq: Once | INTRAVENOUS | Status: AC
Start: 2023-04-30 — End: 2023-04-30
  Administered 2023-04-30: 46 mg via INTRAVENOUS
  Filled 2023-04-30: qty 23

## 2023-04-30 MED ORDER — SODIUM CHLORIDE 0.9 % IV SOLN
150.0000 mg | Freq: Once | INTRAVENOUS | Status: AC
  Administered 2023-04-30: 150 mg via INTRAVENOUS
  Filled 2023-04-30: qty 150

## 2023-04-30 MED ORDER — SODIUM CHLORIDE 0.9% FLUSH
10.0000 mL | INTRAVENOUS | Status: DC | PRN
  Administered 2023-04-30: 10 mL

## 2023-04-30 MED ORDER — SODIUM CHLORIDE 0.9 % IV SOLN
1.2000 mg/kg | Freq: Once | INTRAVENOUS | Status: AC
Start: 2023-04-30 — End: 2023-04-30
  Administered 2023-04-30: 80 mg via INTRAVENOUS
  Filled 2023-04-30: qty 16

## 2023-04-30 NOTE — Patient Instructions (Addendum)
[facility] - A DEPT OF [facility]  Discharge Instructions: Thank you for choosing [facility] to provide your oncology and hematology care.   If you have a lab appointment with the Cancer Center, please go directly to the Cancer Center and check in at the registration area.   Wear comfortable clothing and clothing appropriate for easy access to any Portacath or PICC line.   We strive to give you quality time with your provider. You may need to reschedule your appointment if you arrive late (15 or more minutes).  Arriving late affects you and other patients whose appointments are after yours.  Also, if you miss three or more appointments without notifying the office, you may be dismissed from the clinic at the provider's discretion.      For prescription refill requests, have your pharmacy contact our office and allow 72 hours for refills to be completed.    Today you received the following chemotherapy and/or immunotherapy agents: Doxorubicin, Vinblastine, Dacarbazine, Brentuximab Vedotin      To help prevent nausea and vomiting after your treatment, we encourage you to take your nausea medication as directed.  BELOW ARE SYMPTOMS THAT SHOULD BE REPORTED IMMEDIATELY: *FEVER GREATER THAN 100.4 F (38 C) OR HIGHER *CHILLS OR SWEATING *NAUSEA AND VOMITING THAT IS NOT CONTROLLED WITH YOUR NAUSEA MEDICATION *UNUSUAL SHORTNESS OF BREATH *UNUSUAL BRUISING OR BLEEDING *URINARY PROBLEMS (pain or burning when urinating, or frequent urination) *BOWEL PROBLEMS (unusual diarrhea, constipation, pain near the anus) TENDERNESS IN MOUTH AND THROAT WITH OR WITHOUT PRESENCE OF ULCERS (sore throat, sores in mouth, or a toothache) UNUSUAL RASH, SWELLING OR PAIN  UNUSUAL VAGINAL DISCHARGE OR ITCHING   Items with * indicate a potential emergency and should be followed up as soon as possible or go to the Emergency Department if any problems should occur.  Please  show the CHEMOTHERAPY ALERT CARD or IMMUNOTHERAPY ALERT CARD at check-in to the Emergency Department and triage nurse.  Should you have questions after your visit or need to cancel or reschedule your appointment, please contact [facility] - A DEPT OF [facility]  Dept: [phone]  and follow the prompts.  Office hours are 8:00 a.m. to 4:30 p.m. Monday - Friday. Please note that voicemails left after 4:00 p.m. may not be returned until the following business day.  We are closed weekends and major holidays. You have access to a nurse at all times for urgent questions. Please call the main number to the clinic Dept: [phone] and follow the prompts.   For any non-urgent questions, you may also contact your provider using MyChart. We now offer e-Visits for anyone 16 and older to request care online for non-urgent symptoms. For details visit [email or web address].   Also download the MyChart app! Go to the app store, search "MyChart", open the app, select [facility], and log in with your MyChart username and password.  Doxorubicin Injection What is this medication? DOXORUBICIN (dox oh ROO bi sin) treats some types of cancer. It works by slowing down the growth of cancer cells. This medicine may be used for other purposes; ask your health care provider or pharmacist if you have questions. COMMON BRAND NAME(S): Adriamycin, Adriamycin PFS, Adriamycin RDF, Rubex What should I tell my care team before I take this medication? They need to know if you have any of these conditions: Heart disease History of low blood cell levels caused by  a medication Liver disease Recent or ongoing radiation An unusual or allergic reaction to doxorubicin, other medications, foods, dyes, or preservatives If you or your partner are pregnant or trying to get pregnant Breast-feeding How should I use this medication? This medication is injected into a vein. It is given by your care team  in a hospital or clinic setting. Talk to your care team about the use of this medication in children. Special care may be needed. Overdosage: If you think you have taken too much of this medicine contact a poison control center or emergency room at once. NOTE: This medicine is only for you. Do not share this medicine with others. What if I miss a dose? Keep appointments for follow-up doses. It is important not to miss your dose. Call your care team if you are unable to keep an appointment. What may interact with this medication? 6-mercaptopurine Paclitaxel Phenytoin St. John's wort Trastuzumab Verapamil This list may not describe all possible interactions. Give your health care provider a list of all the medicines, herbs, non-prescription drugs, or dietary supplements you use. Also tell them if you smoke, drink alcohol, or use illegal drugs. Some items may interact with your medicine. What should I watch for while using this medication? Your condition will be monitored carefully while you are receiving this medication. You may need blood work while taking this medication. This medication may make you feel generally unwell. This is not uncommon as chemotherapy can affect healthy cells as well as cancer cells. Report any side effects. Continue your course of treatment even though you feel ill unless your care team tells you to stop. There is a maximum amount of this medication you should receive throughout your life. The amount depends on the medical condition being treated and your overall health. Your care team will watch how much of this medication you receive. Tell your care team if you have taken this medication before. Your urine may turn red for a few days after your dose. This is not blood. If your urine is dark or brown, call your care team. In some cases, you may be given additional medications to help with side effects. Follow all directions for their use. This medication may increase  your risk of getting an infection. Call your care team for advice if you get a fever, chills, sore throat, or other symptoms of a cold or flu. Do not treat yourself. Try to avoid being around people who are sick. This medication may increase your risk to bruise or bleed. Call your care team if you notice any unusual bleeding. Talk to your care team about your risk of cancer. You may be more at risk for certain types of cancers if you take this medication. Talk to your care team if you or your partner may be pregnant. Serious birth defects can occur if you take this medication during pregnancy and for 6 months after the last dose. Contraception is recommended while taking this medication and for 6 months after the last dose. Your care team can help you find the option that works for you. If your partner can get pregnant, use a condom while taking this medication and for 6 months after the last dose. Do not breastfeed while taking this medication. This medication may cause infertility. Talk to your care team if you are concerned about your fertility. What side effects may I notice from receiving this medication? Side effects that you should report to your care team as soon as possible:  Allergic reactions--skin rash, itching, hives, swelling of the face, lips, tongue, or throat Heart failure--shortness of breath, swelling of the ankles, feet, or hands, sudden weight gain, unusual weakness or fatigue Heart rhythm changes--fast or irregular heartbeat, dizziness, feeling faint or lightheaded, chest pain, trouble breathing Infection--fever, chills, cough, sore throat, wounds that don't heal, pain or trouble when passing urine, general feeling of discomfort or being unwell Low red blood cell level--unusual weakness or fatigue, dizziness, headache, trouble breathing Painful swelling, warmth, or redness of the skin, blisters or sores at the infusion site Unusual bruising or bleeding Side effects that usually  do not require medical attention (report to your care team if they continue or are bothersome): Diarrhea Hair loss Nausea Pain, redness, or swelling with sores inside the mouth or throat Red urine This list may not describe all possible side effects. Call your doctor for medical advice about side effects. You may report side effects to FDA at 1-800-FDA-1088. Where should I keep my medication? This medication is given in a hospital or clinic. It will not be stored at home. NOTE: This sheet is a summary. It may not cover all possible information. If you have questions about this medicine, talk to your doctor, pharmacist, or health care provider.  2024 Elsevier/Gold Standard (2022-08-01 00:00:00)  Vinblastine Injection What is this medication? VINBLASTINE (vin BLAS teen) treats some types of cancer. It works by slowing down the growth of cancer cells. This medicine may be used for other purposes; ask your health care provider or pharmacist if you have questions. COMMON BRAND NAME(S): Velban What should I tell my care team before I take this medication? They need to know if you have any of these conditions: Heart disease Infection Liver disease Low white blood cell levels Lung disease An unusual or allergic reaction to vinblastine, other chemotherapy agents, other medications, foods, dyes, or preservatives Pregnant or trying to get pregnant Breast-feeding How should I use this medication? This medication is injected into a vein. It is given by your care team in a hospital or clinic setting. Talk to your care team about the use of this medication in children. While it may be given to children for selected conditions, precautions do apply. Overdosage: If you think you have taken too much of this medicine contact a poison control center or emergency room at once. NOTE: This medicine is only for you. Do not share this medicine with others. What if I miss a dose? Keep appointments for  follow-up doses. It is important not to miss your dose. Call your care team if you are unable to keep an appointment. What may interact with this medication? Erythromycin Phenytoin This medication may affect how other medications work, and other medications may affect the way this medication works. Talk with your care team about all of the medications you take. They may suggest changes to your treatment plan to lower the risk of side effects and to make sure your medications work as intended. This list may not describe all possible interactions. Give your health care provider a list of all the medicines, herbs, non-prescription drugs, or dietary supplements you use. Also tell them if you smoke, drink alcohol, or use illegal drugs. Some items may interact with your medicine. What should I watch for while using this medication? Your condition will be monitored carefully while you are receiving this medication. This medication may make you feel generally unwell. This is not uncommon as chemotherapy can affect healthy cells as well as cancer cells.  Report any side effects. Continue your course of treatment even though you feel ill unless your care team tells you to stop. You may need blood work while taking this medication. This medication will cause constipation. If you do not have a bowel movement for 3 days, call your care team. This medication may increase your risk to bruise or bleed. Call your care team if you notice any unusual bleeding. This medication may increase your risk of getting an infection. Call your care team for advice if you get a fever, chills, sore throat, or other symptoms of a cold or flu. Do not treat yourself. Try to avoid being around people who are sick. Be careful brushing or flossing your teeth or using a toothpick because you may get an infection or bleed more easily. If you have any dental work done, tell your dentist you are receiving this medication. Talk to your care team  if you or your partner wish to become pregnant or think either of you might be pregnant. This medication can cause serious birth defects. This medication may cause infertility. Talk to your care team if you are concerned about your fertility. Talk to your care team before breastfeeding. Changes to your treatment plan may be needed. What side effects may I notice from receiving this medication? Side effects that you should report to your care team as soon as possible: Allergic reactions--skin rash, itching, hives, swelling of the face, lips, tongue, or throat Infection--fever, chills, cough, sore throat, wounds that don't heal, pain or trouble when passing urine, general feeling of discomfort or being unwell Painful swelling, warmth, or redness of the skin, blisters or sores at the infusion site Shortness of breath or trouble breathing Unusual bruising or bleeding Side effects that usually do not require medical attention (report to your care team if they continue or are bothersome): Bone pain Constipation Hair loss Nausea Stomach pain Vomiting This list may not describe all possible side effects. Call your doctor for medical advice about side effects. You may report side effects to FDA at 1-800-FDA-1088. Where should I keep my medication? This medication is given in a hospital or clinic. It will not be stored at home. NOTE: This sheet is a summary. It may not cover all possible information. If you have questions about this medicine, talk to your doctor, pharmacist, or health care provider.  2024 Elsevier/Gold Standard (2021-07-24 00:00:00)  Dacarbazine Injection What is this medication? DACARBAZINE (da KAR ba zeen) treats skin cancer and lymphoma. It works by slowing down the growth of cancer cells. This medicine may be used for other purposes; ask your health care provider or pharmacist if you have questions. COMMON BRAND NAME(S): DTIC-Dome What should I tell my care team before I take  this medication? They need to know if you have any of these conditions: Infection, such as chickenpox, cold sores, herpes Kidney disease Liver disease Low blood cell levels, such as low white cells, platelets, or red blood cells Recent radiation therapy An unusual or allergic reaction to dacarbazine, other medications, foods, dyes, or preservatives Pregnant or trying to get pregnant Breast-feeding How should I use this medication? This medication is given as an injected into a vein. It is given by your care team in a hospital or clinic setting. Talk to your care team about the use of this medication in children. While it may be prescribed for selected conditions, precautions do apply. Overdosage: If you think you have taken too much of this medicine contact a  poison control center or emergency room at once. NOTE: This medicine is only for you. Do not share this medicine with others. What if I miss a dose? Keep appointments for follow-up doses. It is important not to miss your dose. Call your care team if you are unable to keep an appointment. What may interact with this medication? Do not take this medication with any of the following: Live virus vaccines This medication may also interact with the following: Medications to increase blood counts, such as filgrastim, pegfilgrastim, sargramostim This list may not describe all possible interactions. Give your health care provider a list of all the medicines, herbs, non-prescription drugs, or dietary supplements you use. Also tell them if you smoke, drink alcohol, or use illegal drugs. Some items may interact with your medicine. What should I watch for while using this medication? Your condition will be monitored carefully while you are receiving this medication. You may need blood work done while taking this medication. This medication may make you feel generally unwell. This is not uncommon as chemotherapy can affect healthy cells as well as  cancer cells. Report any side effects. Continue your course of treatment even though you feel ill unless your care team tells you to stop. This medication may increase your risk of getting an infection. Call your care team for advice if you get a fever, chills, sore throat, or other symptoms of a cold or flu. Do not treat yourself. Try to avoid being around people who are sick. This medication may increase your risk to bruise or bleed. Call your care team if you notice any unusual bleeding. Talk to your care team about your risk of cancer. You may be more at risk for certain types of cancers if you take this medication. Talk to your care team if you wish to become pregnant or think you might be pregnant. This medication can cause serious birth defects if taken during pregnancy. A reliable form of contraception is recommended while taking this medication. Talk to your care team about effective forms of contraception. Do not breastfeed while taking this medication. What side effects may I notice from receiving this medication? Side effects that you should report to your care team as soon as possible: Allergic reactions--skin rash, itching, hives, swelling of the face, lips, tongue, or throat Infection--fever, chills, cough, sore throat, wounds that don't heal, pain or trouble when passing urine, general feeling of discomfort or being unwell Liver injury--right upper belly pain, loss of appetite, nausea, light-colored stool, dark yellow or brown urine, yellowing skin or eyes, unusual weakness or fatigue Low red blood cell level--unusual weakness or fatigue, dizziness, headache, trouble breathing Painful swelling, warmth, or redness of the skin, blisters or sores at the infusion site Unusual bruising or bleeding Side effects that usually do not require medical attention (report to your care team if they continue or are bothersome): Hair loss Loss of appetite Nausea Vomiting This list may not describe  all possible side effects. Call your doctor for medical advice about side effects. You may report side effects to FDA at 1-800-FDA-1088. Where should I keep my medication? This medication is given in a hospital or clinic. It will not be stored at home. NOTE: This sheet is a summary. It may not cover all possible information. If you have questions about this medicine, talk to your doctor, pharmacist, or health care provider.  2024 Elsevier/Gold Standard (2021-09-04 00:00:00)  Brentuximab Vedotin Injection What is this medication? BRENTUXIMAB VEDOTIN (bren TUX see mab  ve DOE tin) treats lymphoma. It works by blocking a protein that causes cancer cells to grow and multiply. This helps to slow or stop the spread of cancer cells. This medicine may be used for other purposes; ask your health care provider or pharmacist if you have questions. COMMON BRAND NAME(S): ADCETRIS What should I tell my care team before I take this medication? They need to know if you have any of these conditions: Diabetes Kidney disease Liver disease Low white blood cell levels Lung disease Tingling of the fingers or toes or other nerve disorder An unusual or allergic reaction to brentuximab vedotin, other medications, foods, dyes, or preservatives Pregnant or trying to get pregnant Breast-feeding How should I use this medication? This medication is injected into a vein. It is given by your care team in a hospital or clinic setting. Talk to your care team about the use of this medication in children. While it may be given to children as young as 2 years for selected conditions, precautions do apply. Overdosage: If you think you have taken too much of this medicine contact a poison control center or emergency room at once. NOTE: This medicine is only for you. Do not share this medicine with others. What if I miss a dose? Keep appointments for follow-up doses. It is important not to miss your dose. Call your care team if  you are unable to keep an appointment. What may interact with this medication? Do not take this medication with any of the following: Bleomycin This medication may also interact with the following: Ketoconazole Rifampin St. John's Wort This list may not describe all possible interactions. Give your health care provider a list of all the medicines, herbs, non-prescription drugs, or dietary supplements you use. Also tell them if you smoke, drink alcohol, or use illegal drugs. Some items may interact with your medicine. What should I watch for while using this medication? Your condition will be monitored carefully while you are receiving this medication. You may need blood work while taking this medication. This medication may increase your risk of getting an infection. Call your care team for advice if you get a fever, chills, sore throat, or other symptoms of a cold or flu. Do not treat yourself. Try to avoid being around people who are sick. In some patients, this medication may cause a serious brain infection that may cause death. If you have any problems seeing, thinking, speaking, walking, or standing, tell your care team right away. If you cannot reach your care team, urgently seek other source of medical care. This medication may increase blood sugar. The risk may be higher in patients who already have diabetes. Ask your care team what you can do to lower your risk of diabetes while taking this medication. Talk to your care team if you or your partner may be pregnant. You will need a negative pregnancy test before starting this medication. Contraception is recommended while taking this medication and for 2 months after the last dose. Your care team can help you find the option that works for you. Use a condom during sex and for 4 months after stopping therapy. Tell your care team right away if you think your partner might be pregnant. Do not breast-feed while taking this medication. This  medication may cause infertility. Talk to your care team if you are concerned about your fertility. What side effects may I notice from receiving this medication? Side effects that you should report to your care team as  soon as possible: Allergic reactions--skin rash, itching, hives, swelling of the face, lips, tongue, or throat Dizziness, loss of balance or coordination, confusion or trouble speaking High blood sugar (hyperglycemia)--increased thirst or amount of urine, unusual weakness or fatigue, blurry vision Infection--fever, chills, cough, sore throat, wounds that don't heal, pain or trouble when passing urine, general feeling of discomfort or being unwell Liver injury--right upper belly pain, loss of appetite, nausea, light-colored stool, dark yellow or brown urine, yellowing skin or eyes, unusual weakness or fatigue Low red blood cell level--unusual weakness or fatigue, dizziness, headache, trouble breathing Lung injury--shortness of breath or trouble breathing, cough, spitting up blood, chest pain, fever Pain, tingling, or numbness in the hands or feet Pancreatitis--severe stomach pain that spreads to your back or gets worse after eating or when touched, fever, nausea, vomiting Redness, blistering, peeling, or loosening of the skin, including inside the mouth Stomach bleeding--bloody or black, tar-like stools, vomiting blood or brown material that looks like coffee grounds Sudden or severe stomach pain, bloody diarrhea, fever, nausea, vomiting Tumor lysis syndrome (TLS)--nausea, vomiting, diarrhea, decrease in the amount of urine, dark urine, unusual weakness or fatigue, confusion, muscle pain or cramps, fast or irregular heartbeat, joint pain Unusual bruising or bleeding Side effects that usually do not require medical attention (report to your care team if they continue or are bothersome): Cough Diarrhea Fatigue Joint pain Nausea Stomach pain Vomiting This list may not describe  all possible side effects. Call your doctor for medical advice about side effects. You may report side effects to FDA at 1-800-FDA-1088. Where should I keep my medication? This medication is given in a hospital or clinic. It will not be stored at home. NOTE: This sheet is a summary. It may not cover all possible information. If you have questions about this medicine, talk to your doctor, pharmacist, or health care provider.  2024 Elsevier/Gold Standard (2021-11-02 00:00:00)

## 2023-04-30 NOTE — Progress Notes (Signed)
HEMATOLOGY/ONCOLOGY CLINIC NOTE  Date of Service: 04/30/23   Patient Care Team: Patient, No Pcp Per as PCP - General (General Practice)  CHIEF COMPLAINTS/PURPOSE OF CONSULTATION:  F/u for recently diagnosed Hodgkins lymphoma  HISTORY OF PRESENTING ILLNESS:   Timothy Hunter is a wonderful 62 y.o. male who has been referred to [place] by Dr. 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   Patient presented to the ED on 03/09/2023 for GI bleed/acute blood loss anemia/microcytic anemia/low vitamin B12 levels and was seen by Dr. Pamelia Hoit.   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   Today, he is accompanied by his wife. He reports that he was recently seen in the hospital for anemia, with abdominal pain and black stools. His hgb was in 6s at the time. Patient received a CT scan of the abdomen and chest and was found to have several enlarged lymph glands in the center of his chest, including lung nodules and findings in the upper abdomen, bone, and thoracic spine. A biopsy of lymph node in retroperitoneum did show concerns for lymphoma.  Patient reports that he has otherwise been generally been pretty healthy otherwise. He notes taking medication for arthritis and reports a medical history of glaucoma. He denies any other chronic medical issues and does not take any medications on a regular basis otherwise. Patient denies having any surgeries in the past.   He reports that his lower back was evaluated earlier this year due to lower back pain, and there were not any concerning findings at that time. He denies any pain in the middle back. His back pain has improved recently. His wife reports  that his energy level has improved recently.   Patient does not smoke cigarettes at this time. He previously smoked 2 packs a day since he was 62 years old until about 2010. He reports that he rarely consumes alcohol and only consumes alcohol with social use. Patient does smoke marijuana.  There is a fhx of cancer. His wife reports that he has had family members that have passed away from cancer, but she is unsure of the type of cancer they had. His wife reports that patient's younger and older sister do have a history of cancer, and are in remission at this time. His older sister has breast cancer and breast cancer does run in the family. Patient denies any other medical issues in the family. He denies any blood disorders in the family.   Patient reports that he has not endorsed any black stools since being home from the hospital, and denies any other changes in bowel habits.   Patient's wife reports that patient did receive a blood transfusion while in the hospital, as well as potassium, but does not believe that patient received IV iron. His B12 level was also low at that time. He is not on any iron or B12 replacement at this time.   He denies having any other symptoms at this time. Patient denies any lightheadedness/dizziness, SOB, chest pain, abdominal pain, or new leg swelling.  His wife reports that he did take one Tramadol for back pain prevention. He did not have any back pain prior to taking it.   He reports that he has lost 12 pounds over the course of  about 3 months. Patient reports that his eating habits are normal at this time. He does drink two Ensure protein drinks a day and has gained 5-6 pounds recently. He reports that he was not put on any steroids recently.   Patient denies new lumps/bumps. His wife reports that patient has a scar on right forehead, which has been present for a while. He denies any swelling in the groin or testicular pain/swelling.   INTERVAL  HISTORY  Timothy Hunter is a 62 y.o. male here for continued evaluation and management of Hodgkins lymphoma. He was last seen by me on 04/16/2023.   Today, he reports endorsing sleeping issues and generally falls asleep at 3-4 AM. Patient notes that he is hesitant to fall asleep due to concern for nightmares.   He denies any change in energy levels. Patient reports SOB frequently and notes becoming tired quickly. He notes that he tends to take naps in the afternoons.   He reports stable lower back pain in general and some discomfort in the upper abdomen. He denies any back pain at this time.   He does have pain medications and nausea medications available to use if needed.  Patient reports that he uses Senna as needed and his constipation is improving. He denies any black stools or blood in the stools.   He continues to take Protonix daily prior to breakfast.   MEDICAL HISTORY:  Past Medical History:  Diagnosis Date   Dyspnea    Glaucoma associated with anomalies of iris     SURGICAL HISTORY: Past Surgical History:  Procedure Laterality Date   BIOPSY  03/11/2023   Procedure: BIOPSY;  Surgeon: Kerin Salen, MD;  Location: [facility];  Service: Gastroenterology;;   ESOPHAGOGASTRODUODENOSCOPY (EGD) WITH PROPOFOL N/A 03/11/2023   Procedure: ESOPHAGOGASTRODUODENOSCOPY (EGD) WITH PROPOFOL;  Surgeon: Kerin Salen, MD;  Location: [facility];  Service: Gastroenterology;  Laterality: N/A;   HOT HEMOSTASIS N/A 03/11/2023   Procedure: HOT HEMOSTASIS (ARGON PLASMA COAGULATION/BICAP);  Surgeon: Kerin Salen, MD;  Location: [facility];  Service: Gastroenterology;  Laterality: N/A;   IR IMAGING GUIDED PORT INSERTION  03/26/2023    SOCIAL HISTORY: Social History   Socioeconomic History   Marital status: Widowed    Spouse name: Not on file   Number of children: Not on file   Years of education: Not on file   Highest education level: Not on file  Occupational History   Not on file   Tobacco Use   Smoking status: Former    Passive exposure: Never   Smokeless tobacco: Not on file  Vaping Use   Vaping status: Never Used  Substance and Sexual Activity   Alcohol use: Yes    Comment: occasionally   Drug use: Yes    Types: Marijuana   Sexual activity: Not on file  Other Topics Concern   Not on file  Social History Narrative   Not on file   Social Drivers of Health   Financial Resource Strain: Not on file  Food Insecurity: No Food Insecurity (03/09/2023)   Hunger Vital Sign    Worried About Running Out of Food in the Last Year: Never true    Ran Out of Food in the Last Year: Never true  Transportation Needs: No Transportation Needs (03/09/2023)   PRAPARE - Administrator, Civil Service (Medical): No    Lack of Transportation (Non-Medical): No  Physical Activity: Not on file  Stress: Not on file  Social Connections: Not on file  Intimate Partner Violence: Not At Risk (03/09/2023)   Humiliation, Afraid, Rape, and Kick questionnaire    Fear of Current or Ex-Partner: No    Emotionally Abused: No    Physically Abused: No    Sexually Abused: No    FAMILY HISTORY: No family history on file.  ALLERGIES:  has no known allergies.  MEDICATIONS:  Current Outpatient Medications  Medication Sig Dispense Refill   calcium-vitamin D (OSCAL WITH D) 500-5 MG-MCG tablet Take 4 tablets by mouth daily.     Cyanocobalamin (VITAMIN B-12 PO) Take 1 Dose by mouth daily.     dexamethasone (DECADRON) 4 MG tablet Take 2 tablets by mouth once a day for 3 days after chemotherapy. Take with food. 30 tablet 1   feeding supplement (ENSURE ENLIVE / ENSURE PLUS) LIQD Take 237 mLs by mouth 2 (two) times daily between meals. 237 mL 12   ferrous sulfate 325 (65 FE) MG tablet Take 325 mg by mouth daily with breakfast.     latanoprost (XALATAN) 0.005 % ophthalmic solution Place 1 drop into both eyes at bedtime.     lidocaine-prilocaine (EMLA) cream Apply to affected area once  30 g 3   ondansetron (ZOFRAN) 8 MG tablet Take 1 tablet (8 mg total) by mouth every 8 (eight) hours as needed for nausea or vomiting. Start on the third day after chemotherapy. 30 tablet 1   pantoprazole (PROTONIX) 40 MG tablet Take 1 tablet (40 mg total) by mouth daily before breakfast. 30 tablet 2   prochlorperazine (COMPAZINE) 10 MG tablet Take 1 tablet (10 mg total) by mouth every 6 (six) hours as needed for nausea or vomiting. 30 tablet 1   timolol (TIMOPTIC) 0.5 % ophthalmic solution Place 1 drop into both eyes in the morning.     traMADol (ULTRAM) 50 MG tablet Take 1 tablet (50 mg total) by mouth every 6 (six) hours as needed. 30 tablet 0   No current facility-administered medications for this visit.    REVIEW OF SYSTEMS:    10 Point review of Systems was done is negative except as noted above.    PHYSICAL EXAMINATION: ECOG PERFORMANCE STATUS: 2 - Symptomatic, <50% confined to bed  . Vitals:   04/30/23 0938  BP: 139/73  Pulse: 99  Resp: 20  Temp: 97.7 F (36.5 C)  SpO2: 100%    Filed Weights   04/30/23 0938  Weight: 151 lb 4.8 oz (68.6 kg)    .Body mass index is 22.34 kg/m.  GENERAL:alert, in no acute distress and comfortable SKIN: no acute rashes, no significant lesions EYES: conjunctiva are pink and non-injected, sclera anicteric OROPHARYNX: MMM, no exudates, no oropharyngeal erythema or ulceration NECK: supple, no JVD LYMPH:  no palpable lymphadenopathy in the cervical, axillary or inguinal regions LUNGS: clear to auscultation b/l with normal respiratory effort HEART: regular rate & rhythm ABDOMEN:  normoactive bowel sounds , non tender, not distended. Extremity: no pedal edema PSYCH: alert & oriented x 3 with fluent speech NEURO: no focal motor/sensory deficits    LABORATORY DATA:  I have reviewed the data as listed  .    Latest Ref Rng & Units 04/30/2023    8:50 AM 04/22/2023   11:28 AM 03/20/2023    2:55 PM  CBC  WBC 4.0 - 10.5 K/uL 10.7  8.5   5.6   Hemoglobin 13.0 - 17.0 g/dL 8.2  8.6  8.4   Hematocrit 39.0 - 52.0 % 28.0  30.3  28.6   Platelets 150 -  400 K/uL 364  340  389     .    Latest Ref Rng & Units 04/30/2023    8:50 AM 03/20/2023    2:55 PM 03/12/2023    4:16 AM  CMP  Glucose 70 - 99 mg/dL 161  096  045   BUN 8 - 23 mg/dL 8  11  10    Creatinine 0.61 - 1.24 mg/dL 4.09  8.11  9.14   Sodium 135 - 145 mmol/L 136  137  137   Potassium 3.5 - 5.1 mmol/L 3.4  3.5  3.5   Chloride 98 - 111 mmol/L 103  101  107   CO2 22 - 32 mmol/L 26  29  20    Calcium 8.9 - 10.3 mg/dL 9.3  9.6  8.8   Total Protein 6.5 - 8.1 g/dL 8.1  9.1    Total Bilirubin <1.2 mg/dL 0.3  0.3    Alkaline Phos 38 - 126 U/L 69  62    AST 15 - 41 U/L 12  15    ALT 0 - 44 U/L 13  12     04/22/2023 right supraclavicular lymph node biopsy:         03/11/2023 biopsy of left retroperitoneal lymph node:     RADIOGRAPHIC STUDIES: I have personally reviewed the radiological images as listed and agreed with the findings in the report. [place] CORE BIOPSY (LYMPH NODES) Result Date: 04/22/2023 INDICATION: B-cell lymphoma with prior biopsy of retroperitoneal lymph node on 03/11/2023. Additional molecular analysis requested via biopsy of a hypermetabolic, enlarged right supraclavicular lymph node. EXAM: ULTRASOUND GUIDED CORE BIOPSY OF RIGHT SUPRACLAVICULAR LYMPH NODE MEDICATIONS: None. ANESTHESIA/SEDATION: Moderate (conscious) sedation was employed during this procedure. A total of Versed 2.0 mg and Fentanyl 100 mcg was administered intravenously. Moderate Sedation Time: 15 minutes. The patient's level of consciousness and vital signs were monitored continuously by radiology nursing throughout the procedure under my direct supervision. PROCEDURE: The procedure, risks, benefits, and alternatives were explained to the patient. Questions regarding the procedure were encouraged and answered. The patient understands and consents to the procedure. A time-out was performed  prior to initiating the procedure. The right neck was prepped with chlorhexidine in a sterile fashion, and a sterile drape was applied covering the operative field. A sterile gown and sterile gloves were used for the procedure. Local anesthesia was provided with 1% Lidocaine. After localizing an enlarged right supraclavicular lymph node, an 18 gauge core biopsy device was utilized in obtaining 4 separate samples through different portions of the lymph node. Core biopsy samples were submitted in saline. Additional ultrasound was performed. COMPLICATIONS: None immediate. FINDINGS: Enlarged right supraclavicular lymph node measures approximately 2.5 x 1.4 x 1.8 cm. Solid core biopsy samples were obtained. IMPRESSION: Ultrasound-guided core biopsy performed of an enlarged right supraclavicular lymph node. Electronically Signed   By: Irish Lack M.D.   On: 04/22/2023 13:39   NM PET Image Initial (PI) Skull Base To Thigh Result Date: 04/16/2023 CLINICAL DATA:  Initial treatment strategy for non-Hodgkin lymphoma. EXAM: NUCLEAR MEDICINE PET SKULL BASE TO THIGH TECHNIQUE: 7.6 mCi F-18 FDG was injected intravenously. Full-ring PET imaging was performed from the skull base to thigh after the radiotracer. CT data was obtained and used for attenuation correction and anatomic localization. Fasting blood glucose: 125 mg/dl COMPARISON:  CT chest 78/29/5621 and CT abdomen pelvis 03/09/2023. FINDINGS: Mediastinal blood pool activity: SUV max 2.3 Liver activity: SUV max 3.6 NECK: No abnormal hypermetabolism. Incidental CT findings: None. CHEST: Hypermetabolic  right supraclavicular, mediastinal and extrapleural lymph nodes. Index subcarinal lymph node measures 1.8 cm, SUV max 12.9. No additional abnormal hypermetabolism. Incidental CT findings: Right IJ Port-A-Cath terminates in the high right atrium. Atherosclerotic calcification of the aorta and coronary arteries. Heart is at the upper limits of normal in size to mildly  enlarged. No pericardial or pleural effusion. Right-sided paraspinal extrapleural soft tissue thickening adjacent to lytic lesions in the T9 and T11 vertebral bodies. ABDOMEN/PELVIS: Hypermetabolic retrocrural, periportal and abdominopelvic retroperitoneal adenopathy. Index portacaval nodal mass measures approximately 2.9 cm (4/110), SUV max 10.6. No additional abnormal hypermetabolism. Incidental CT findings: None. SKELETON: Hypermetabolic lytic lesion in the T11 vertebral body, SUV max 19.9. Additional hypermetabolic lesions in the sternum and T9 vertebral body. Incidental CT findings: Degenerative changes in the spine. IMPRESSION: 1. Hypermetabolic adenopathy throughout the chest, abdomen and pelvis, with hypermetabolic osseous lesions, compatible with the provided history of non-Hodgkin lymphoma. 2. Aortic atherosclerosis (ICD10-I70.0). Coronary artery calcification. Electronically Signed   By: Leanna Battles M.D.   On: 04/16/2023 12:57    ASSESSMENT & PLAN:  63 y.o. male with:  Newly diagnosed stage IV Hodgkins lymphoma CD30+ve  PLAN:   -Discussed lab results on 04/30/23 in detail with patient. CBC showed WBC of 10.7K, hemoglobin of 8.2, and platelets of 364K. -anemia is partly due to lymphoma -Right Supraclavicular lymph node biopsy on 04/22/2023 more definitively confirmed diagnosis of CD30 positive hodgkin's lymphoma. His previous biopsy on 03/11/2023 from [facility] also suggested hodgkin's lymphoma.  -will proceed with standard treatment for hodgkin's lymphoma -discussed that generally, hodgkin's lymphoma responds to treatment fairly quickly.  -After 2-3 cycles of treatment, we shall repeat PET scan to evaluate his response to treatment -will order Lorazepam to use as needed to improve sleep habits/anxiety. He may need to use this after receiving steroids after each treatment -advised patient to take Protonix 20-30 minutes prior to breaskfast to optimize absorption -patient shall return to  clinic in 7-10 days to evaluate how he tolerates his first treatment -Patient shall receive IV iron on 05/01/2023 and 05/09/2023 -answered all of patient's questions in detail  FOLLOW-UP: Portflush, labs and MD visit on 12/26 for toxicity check  The total time spent in the appointment was 30 minutes* .  All of the patient's questions were answered with apparent satisfaction. The patient knows to call the clinic with any problems, questions or concerns.   Wyvonnia Lora MD MS AAHIVMS [facility] Hematology/Oncology Physician [facility]  .*Total Encounter Time as defined by the Centers for Medicare and Medicaid Services includes, in addition to the face-to-face time of a patient visit (documented in the note above) non-face-to-face time: obtaining and reviewing outside history, ordering and reviewing medications, tests or procedures, care coordination (communications with other health care professionals or caregivers) and documentation in the medical record.    I,Mitra Faeizi,acting as a Neurosurgeon for Wyvonnia Lora, MD.,have documented all relevant documentation on the behalf of Wyvonnia Lora, MD,as directed by  Wyvonnia Lora, MD while in the presence of Wyvonnia Lora, MD.  .I have reviewed the above documentation for accuracy and completeness, and I agree with the above. [name] MD

## 2023-04-30 NOTE — Progress Notes (Signed)
Patient seen by Dr. Addison Naegeli are within treatment parameters.  Labs reviewed: and are within treatment parameters. Dr Candise Che aware K: 3.4, pt to get orak K in infusion  Per physician team, patient is ready for treatment and there are NO modifications to the treatment plan. 20 meg Potassium po /signed and held

## 2023-05-01 ENCOUNTER — Telehealth: Payer: Self-pay

## 2023-05-01 ENCOUNTER — Ambulatory Visit (INDEPENDENT_AMBULATORY_CARE_PROVIDER_SITE_OTHER): Payer: 59 | Admitting: *Deleted

## 2023-05-01 VITALS — BP 145/72 | HR 72 | Temp 98.7°F | Resp 18 | Ht 69.0 in | Wt 152.2 lb

## 2023-05-01 DIAGNOSIS — D509 Iron deficiency anemia, unspecified: Secondary | ICD-10-CM

## 2023-05-01 MED ORDER — DIPHENHYDRAMINE HCL 25 MG PO CAPS
25.0000 mg | ORAL_CAPSULE | Freq: Once | ORAL | Status: AC
  Administered 2023-05-01: 25 mg via ORAL
  Filled 2023-05-01: qty 1

## 2023-05-01 MED ORDER — ACETAMINOPHEN 325 MG PO TABS
650.0000 mg | ORAL_TABLET | Freq: Once | ORAL | Status: AC
  Administered 2023-05-01: 650 mg via ORAL
  Filled 2023-05-01: qty 2

## 2023-05-01 MED ORDER — IRON SUCROSE 20 MG/ML IV SOLN
200.0000 mg | Freq: Once | INTRAVENOUS | Status: AC
  Administered 2023-05-01: 200 mg via INTRAVENOUS
  Filled 2023-05-01: qty 10

## 2023-05-01 NOTE — Progress Notes (Signed)
 Diagnosis: Iron Deficiency Anemia  Provider:  Chilton Greathouse MD  Procedure: IV Push  IV Type: Peripheral, IV Location: L Antecubital  Venofer (Iron Sucrose), Dose: 200 mg  Post Infusion IV Care: Observation period completed and Peripheral IV Discontinued  Discharge: Condition: Good, Destination: Home . AVS Declined  Performed by:  Forrest Moron, RN

## 2023-05-01 NOTE — Telephone Encounter (Signed)
LM for patient that this nurse was calling to see how they were doing after their treatment. Please call back to Dr. Kale's nurse at [phone] if they have any questions or concerns regarding the treatment. 

## 2023-05-02 ENCOUNTER — Inpatient Hospital Stay: Payer: 59

## 2023-05-02 ENCOUNTER — Telehealth: Payer: Self-pay | Admitting: Hematology

## 2023-05-02 VITALS — BP 140/79 | HR 87 | Temp 98.4°F | Resp 18

## 2023-05-02 DIAGNOSIS — I700 Atherosclerosis of aorta: Secondary | ICD-10-CM | POA: Diagnosis not present

## 2023-05-02 DIAGNOSIS — C8190 Hodgkin lymphoma, unspecified, unspecified site: Secondary | ICD-10-CM | POA: Diagnosis not present

## 2023-05-02 DIAGNOSIS — Q2112 Patent foramen ovale: Secondary | ICD-10-CM | POA: Diagnosis not present

## 2023-05-02 DIAGNOSIS — C8330 Diffuse large B-cell lymphoma, unspecified site: Secondary | ICD-10-CM | POA: Diagnosis not present

## 2023-05-02 DIAGNOSIS — Z79632 Long term (current) use of antitumor antibiotic: Secondary | ICD-10-CM | POA: Diagnosis not present

## 2023-05-02 DIAGNOSIS — Z79620 Long term (current) use of immunosuppressive biologic: Secondary | ICD-10-CM | POA: Diagnosis not present

## 2023-05-02 DIAGNOSIS — C8178 Other classical Hodgkin lymphoma, lymph nodes of multiple sites: Secondary | ICD-10-CM

## 2023-05-02 DIAGNOSIS — R918 Other nonspecific abnormal finding of lung field: Secondary | ICD-10-CM | POA: Diagnosis not present

## 2023-05-02 DIAGNOSIS — R21 Rash and other nonspecific skin eruption: Secondary | ICD-10-CM | POA: Diagnosis not present

## 2023-05-02 DIAGNOSIS — K5900 Constipation, unspecified: Secondary | ICD-10-CM | POA: Diagnosis not present

## 2023-05-02 DIAGNOSIS — Z5111 Encounter for antineoplastic chemotherapy: Secondary | ICD-10-CM | POA: Diagnosis not present

## 2023-05-02 DIAGNOSIS — R5383 Other fatigue: Secondary | ICD-10-CM | POA: Diagnosis not present

## 2023-05-02 DIAGNOSIS — Z79899 Other long term (current) drug therapy: Secondary | ICD-10-CM | POA: Diagnosis not present

## 2023-05-02 DIAGNOSIS — Z79630 Long term (current) use of alkylating agent: Secondary | ICD-10-CM | POA: Diagnosis not present

## 2023-05-02 DIAGNOSIS — Z5112 Encounter for antineoplastic immunotherapy: Secondary | ICD-10-CM | POA: Diagnosis not present

## 2023-05-02 DIAGNOSIS — I517 Cardiomegaly: Secondary | ICD-10-CM | POA: Diagnosis not present

## 2023-05-02 DIAGNOSIS — M545 Low back pain, unspecified: Secondary | ICD-10-CM | POA: Diagnosis not present

## 2023-05-02 DIAGNOSIS — K31811 Angiodysplasia of stomach and duodenum with bleeding: Secondary | ICD-10-CM | POA: Diagnosis not present

## 2023-05-02 DIAGNOSIS — Z79633 Long term (current) use of mitotic inhibitor: Secondary | ICD-10-CM | POA: Diagnosis not present

## 2023-05-02 DIAGNOSIS — R7402 Elevation of levels of lactic acid dehydrogenase (LDH): Secondary | ICD-10-CM | POA: Diagnosis not present

## 2023-05-02 DIAGNOSIS — E538 Deficiency of other specified B group vitamins: Secondary | ICD-10-CM | POA: Diagnosis not present

## 2023-05-02 DIAGNOSIS — H409 Unspecified glaucoma: Secondary | ICD-10-CM | POA: Diagnosis not present

## 2023-05-02 DIAGNOSIS — Z5189 Encounter for other specified aftercare: Secondary | ICD-10-CM | POA: Diagnosis not present

## 2023-05-02 DIAGNOSIS — R61 Generalized hyperhidrosis: Secondary | ICD-10-CM | POA: Diagnosis not present

## 2023-05-02 DIAGNOSIS — D62 Acute posthemorrhagic anemia: Secondary | ICD-10-CM | POA: Diagnosis not present

## 2023-05-02 MED ORDER — PEGFILGRASTIM-JMDB 6 MG/0.6ML ~~LOC~~ SOSY
6.0000 mg | PREFILLED_SYRINGE | Freq: Once | SUBCUTANEOUS | Status: AC
  Administered 2023-05-02: 6 mg via SUBCUTANEOUS
  Filled 2023-05-02: qty 0.6

## 2023-05-02 NOTE — Telephone Encounter (Signed)
Spoke with patient confirming upcoming appointment  

## 2023-05-06 ENCOUNTER — Encounter: Payer: Self-pay | Admitting: Hematology

## 2023-05-08 ENCOUNTER — Ambulatory Visit: Payer: 59 | Admitting: Hematology

## 2023-05-08 ENCOUNTER — Other Ambulatory Visit: Payer: 59

## 2023-05-09 ENCOUNTER — Ambulatory Visit (INDEPENDENT_AMBULATORY_CARE_PROVIDER_SITE_OTHER): Payer: 59

## 2023-05-09 VITALS — BP 137/84 | HR 94 | Temp 99.0°F | Resp 18 | Ht 69.0 in | Wt 151.6 lb

## 2023-05-09 DIAGNOSIS — D509 Iron deficiency anemia, unspecified: Secondary | ICD-10-CM

## 2023-05-09 LAB — SURGICAL PATHOLOGY

## 2023-05-09 MED ORDER — DIPHENHYDRAMINE HCL 25 MG PO CAPS
25.0000 mg | ORAL_CAPSULE | Freq: Once | ORAL | Status: AC
  Administered 2023-05-09: 25 mg via ORAL
  Filled 2023-05-09: qty 1

## 2023-05-09 MED ORDER — ACETAMINOPHEN 325 MG PO TABS
650.0000 mg | ORAL_TABLET | Freq: Once | ORAL | Status: AC
  Administered 2023-05-09: 650 mg via ORAL
  Filled 2023-05-09: qty 2

## 2023-05-09 MED ORDER — IRON SUCROSE 20 MG/ML IV SOLN
200.0000 mg | Freq: Once | INTRAVENOUS | Status: AC
  Administered 2023-05-09: 200 mg via INTRAVENOUS
  Filled 2023-05-09: qty 10

## 2023-05-09 NOTE — Progress Notes (Signed)
 Diagnosis: Iron Deficiency Anemia  Provider:  Chilton Greathouse MD  Procedure: IV Push  IV Type: Peripheral, IV Location: L Forearm  Venofer (Iron Sucrose), Dose: 200 mg  Post Infusion IV Care: Observation period completed and Peripheral IV Discontinued  Discharge: Condition: Good, Destination: Home . AVS Provided  Performed by:  Adriana Mccallum, RN

## 2023-05-16 MED FILL — Fosaprepitant Dimeglumine For IV Infusion 150 MG (Base Eq): INTRAVENOUS | Qty: 5 | Status: AC

## 2023-05-19 ENCOUNTER — Inpatient Hospital Stay: Payer: 59

## 2023-05-19 ENCOUNTER — Inpatient Hospital Stay: Payer: 59 | Attending: Hematology

## 2023-05-19 ENCOUNTER — Inpatient Hospital Stay: Payer: 59 | Admitting: Hematology

## 2023-05-19 VITALS — BP 129/74 | HR 73 | Temp 97.5°F | Resp 16 | Wt 158.2 lb

## 2023-05-19 DIAGNOSIS — Z79630 Long term (current) use of alkylating agent: Secondary | ICD-10-CM | POA: Insufficient documentation

## 2023-05-19 DIAGNOSIS — M545 Low back pain, unspecified: Secondary | ICD-10-CM | POA: Diagnosis not present

## 2023-05-19 DIAGNOSIS — Z803 Family history of malignant neoplasm of breast: Secondary | ICD-10-CM | POA: Insufficient documentation

## 2023-05-19 DIAGNOSIS — R21 Rash and other nonspecific skin eruption: Secondary | ICD-10-CM | POA: Insufficient documentation

## 2023-05-19 DIAGNOSIS — Z95828 Presence of other vascular implants and grafts: Secondary | ICD-10-CM

## 2023-05-19 DIAGNOSIS — R918 Other nonspecific abnormal finding of lung field: Secondary | ICD-10-CM | POA: Diagnosis not present

## 2023-05-19 DIAGNOSIS — C8178 Other classical Hodgkin lymphoma, lymph nodes of multiple sites: Secondary | ICD-10-CM

## 2023-05-19 DIAGNOSIS — Z79633 Long term (current) use of mitotic inhibitor: Secondary | ICD-10-CM | POA: Diagnosis not present

## 2023-05-19 DIAGNOSIS — Z79620 Long term (current) use of immunosuppressive biologic: Secondary | ICD-10-CM | POA: Diagnosis not present

## 2023-05-19 DIAGNOSIS — R109 Unspecified abdominal pain: Secondary | ICD-10-CM | POA: Diagnosis not present

## 2023-05-19 DIAGNOSIS — Z79899 Other long term (current) drug therapy: Secondary | ICD-10-CM | POA: Insufficient documentation

## 2023-05-19 DIAGNOSIS — Z681 Body mass index (BMI) 19 or less, adult: Secondary | ICD-10-CM | POA: Diagnosis not present

## 2023-05-19 DIAGNOSIS — D62 Acute posthemorrhagic anemia: Secondary | ICD-10-CM | POA: Diagnosis not present

## 2023-05-19 DIAGNOSIS — M549 Dorsalgia, unspecified: Secondary | ICD-10-CM | POA: Diagnosis not present

## 2023-05-19 DIAGNOSIS — E46 Unspecified protein-calorie malnutrition: Secondary | ICD-10-CM | POA: Diagnosis not present

## 2023-05-19 DIAGNOSIS — Z5111 Encounter for antineoplastic chemotherapy: Secondary | ICD-10-CM | POA: Diagnosis not present

## 2023-05-19 DIAGNOSIS — K31811 Angiodysplasia of stomach and duodenum with bleeding: Secondary | ICD-10-CM | POA: Insufficient documentation

## 2023-05-19 DIAGNOSIS — C8330 Diffuse large B-cell lymphoma, unspecified site: Secondary | ICD-10-CM | POA: Insufficient documentation

## 2023-05-19 DIAGNOSIS — Z87891 Personal history of nicotine dependence: Secondary | ICD-10-CM | POA: Insufficient documentation

## 2023-05-19 DIAGNOSIS — Z5112 Encounter for antineoplastic immunotherapy: Secondary | ICD-10-CM | POA: Insufficient documentation

## 2023-05-19 DIAGNOSIS — Z79632 Long term (current) use of antitumor antibiotic: Secondary | ICD-10-CM | POA: Diagnosis not present

## 2023-05-19 DIAGNOSIS — Z5189 Encounter for other specified aftercare: Secondary | ICD-10-CM | POA: Diagnosis not present

## 2023-05-19 LAB — CBC WITH DIFFERENTIAL (CANCER CENTER ONLY)
Abs Immature Granulocytes: 0.02 10*3/uL (ref 0.00–0.07)
Basophils Absolute: 0.1 10*3/uL (ref 0.0–0.1)
Basophils Relative: 1 %
Eosinophils Absolute: 0.1 10*3/uL (ref 0.0–0.5)
Eosinophils Relative: 2 %
HCT: 31.3 % — ABNORMAL LOW (ref 39.0–52.0)
Hemoglobin: 9.6 g/dL — ABNORMAL LOW (ref 13.0–17.0)
Immature Granulocytes: 0 %
Lymphocytes Relative: 32 %
Lymphs Abs: 1.9 10*3/uL (ref 0.7–4.0)
MCH: 23.5 pg — ABNORMAL LOW (ref 26.0–34.0)
MCHC: 30.7 g/dL (ref 30.0–36.0)
MCV: 76.5 fL — ABNORMAL LOW (ref 80.0–100.0)
Monocytes Absolute: 0.6 10*3/uL (ref 0.1–1.0)
Monocytes Relative: 10 %
Neutro Abs: 3.1 10*3/uL (ref 1.7–7.7)
Neutrophils Relative %: 55 %
Platelet Count: 249 10*3/uL (ref 150–400)
RBC: 4.09 MIL/uL — ABNORMAL LOW (ref 4.22–5.81)
RDW: 31.8 % — ABNORMAL HIGH (ref 11.5–15.5)
Smear Review: NORMAL
WBC Count: 5.8 10*3/uL (ref 4.0–10.5)
nRBC: 0 % (ref 0.0–0.2)

## 2023-05-19 LAB — CMP (CANCER CENTER ONLY)
ALT: 37 U/L (ref 0–44)
AST: 25 U/L (ref 15–41)
Albumin: 3.9 g/dL (ref 3.5–5.0)
Alkaline Phosphatase: 65 U/L (ref 38–126)
Anion gap: 6 (ref 5–15)
BUN: 12 mg/dL (ref 8–23)
CO2: 27 mmol/L (ref 22–32)
Calcium: 9 mg/dL (ref 8.9–10.3)
Chloride: 106 mmol/L (ref 98–111)
Creatinine: 0.98 mg/dL (ref 0.61–1.24)
GFR, Estimated: >60 mL/min (ref >60)
Glucose, Bld: 102 mg/dL — ABNORMAL HIGH (ref 70–99)
Potassium: 3.5 mmol/L (ref 3.5–5.1)
Sodium: 139 mmol/L (ref 135–145)
Total Bilirubin: 0.2 mg/dL (ref 0.0–1.2)
Total Protein: 7.5 g/dL (ref 6.5–8.1)

## 2023-05-19 MED ORDER — HEPARIN SOD (PORK) LOCK FLUSH 100 UNIT/ML IV SOLN
500.0000 [IU] | Freq: Once | INTRAVENOUS | Status: AC | PRN
  Administered 2023-05-19: 500 [IU]

## 2023-05-19 MED ORDER — DIPHENHYDRAMINE HCL 50 MG/ML IJ SOLN
50.0000 mg | Freq: Once | INTRAMUSCULAR | Status: AC
  Administered 2023-05-19: 50 mg via INTRAVENOUS
  Filled 2023-05-19: qty 1

## 2023-05-19 MED ORDER — DOXORUBICIN HCL CHEMO IV INJECTION 2 MG/ML
25.0000 mg/m2 | Freq: Once | INTRAVENOUS | Status: AC
  Administered 2023-05-19: 46 mg via INTRAVENOUS
  Filled 2023-05-19: qty 23

## 2023-05-19 MED ORDER — ACETAMINOPHEN 325 MG PO TABS
650.0000 mg | ORAL_TABLET | Freq: Once | ORAL | Status: AC
  Administered 2023-05-19: 650 mg via ORAL
  Filled 2023-05-19: qty 2

## 2023-05-19 MED ORDER — SODIUM CHLORIDE 0.9 % IV SOLN
375.0000 mg/m2 | Freq: Once | INTRAVENOUS | Status: AC
  Administered 2023-05-19: 680 mg via INTRAVENOUS
  Filled 2023-05-19: qty 68

## 2023-05-19 MED ORDER — DEXAMETHASONE SODIUM PHOSPHATE 10 MG/ML IJ SOLN
10.0000 mg | Freq: Once | INTRAMUSCULAR | Status: AC
  Administered 2023-05-19: 10 mg via INTRAVENOUS
  Filled 2023-05-19: qty 1

## 2023-05-19 MED ORDER — VINBLASTINE SULFATE CHEMO INJECTION 1 MG/ML
6.0000 mg/m2 | Freq: Once | INTRAVENOUS | Status: AC
  Administered 2023-05-19: 10.9 mg via INTRAVENOUS
  Filled 2023-05-19: qty 10.9

## 2023-05-19 MED ORDER — SODIUM CHLORIDE 0.9% FLUSH
10.0000 mL | INTRAVENOUS | Status: DC | PRN
  Administered 2023-05-19: 10 mL

## 2023-05-19 MED ORDER — SODIUM CHLORIDE 0.9 % IV SOLN
150.0000 mg | Freq: Once | INTRAVENOUS | Status: AC
  Administered 2023-05-19: 150 mg via INTRAVENOUS
  Filled 2023-05-19: qty 150

## 2023-05-19 MED ORDER — SODIUM CHLORIDE 0.9 % IV SOLN
1.2000 mg/kg | Freq: Once | INTRAVENOUS | Status: AC
  Administered 2023-05-19: 80 mg via INTRAVENOUS
  Filled 2023-05-19: qty 16

## 2023-05-19 MED ORDER — SODIUM CHLORIDE 0.9% FLUSH
10.0000 mL | Freq: Once | INTRAVENOUS | Status: AC
  Administered 2023-05-19: 10 mL

## 2023-05-19 MED ORDER — SODIUM CHLORIDE 0.9 % IV SOLN: INTRAVENOUS | Status: DC

## 2023-05-19 MED ORDER — PALONOSETRON HCL INJECTION 0.25 MG/5ML
0.2500 mg | Freq: Once | INTRAVENOUS | Status: AC
  Administered 2023-05-19: 0.25 mg via INTRAVENOUS
  Filled 2023-05-19: qty 5

## 2023-05-19 NOTE — Patient Instructions (Signed)
 [facility] - A DEPT OF [facility]  Discharge Instructions: Thank you for choosing [facility] to provide your oncology and hematology care.   If you have a lab appointment with the Cancer Center, please go directly to the Cancer Center and check in at the registration area.   Wear comfortable clothing and clothing appropriate for easy access to any Portacath or PICC line.   We strive to give you quality time with your provider. You may need to reschedule your appointment if you arrive late (15 or more minutes).  Arriving late affects you and other patients whose appointments are after yours.  Also, if you miss three or more appointments without notifying the office, you may be dismissed from the clinic at the provider's discretion.      For prescription refill requests, have your pharmacy contact our office and allow 72 hours for refills to be completed.    Today you received the following chemotherapy and/or immunotherapy agents: Doxorubicin, Vinblastine, Dacarbazine, Brentuximab Vedotin      To help prevent nausea and vomiting after your treatment, we encourage you to take your nausea medication as directed.  BELOW ARE SYMPTOMS THAT SHOULD BE REPORTED IMMEDIATELY: *FEVER GREATER THAN 100.4 F (38 C) OR HIGHER *CHILLS OR SWEATING *NAUSEA AND VOMITING THAT IS NOT CONTROLLED WITH YOUR NAUSEA MEDICATION *UNUSUAL SHORTNESS OF BREATH *UNUSUAL BRUISING OR BLEEDING *URINARY PROBLEMS (pain or burning when urinating, or frequent urination) *BOWEL PROBLEMS (unusual diarrhea, constipation, pain near the anus) TENDERNESS IN MOUTH AND THROAT WITH OR WITHOUT PRESENCE OF ULCERS (sore throat, sores in mouth, or a toothache) UNUSUAL RASH, SWELLING OR PAIN  UNUSUAL VAGINAL DISCHARGE OR ITCHING   Items with * indicate a potential emergency and should be followed up as soon as possible or go to the Emergency Department if any problems should occur.  Please  show the CHEMOTHERAPY ALERT CARD or IMMUNOTHERAPY ALERT CARD at check-in to the Emergency Department and triage nurse.  Should you have questions after your visit or need to cancel or reschedule your appointment, please contact [facility] - A DEPT OF [facility]  Dept: [phone]  and follow the prompts.  Office hours are 8:00 a.m. to 4:30 p.m. Monday - Friday. Please note that voicemails left after 4:00 p.m. may not be returned until the following business day.  We are closed weekends and major holidays. You have access to a nurse at all times for urgent questions. Please call the main number to the clinic Dept: [phone] and follow the prompts.   For any non-urgent questions, you may also contact your provider using MyChart. We now offer e-Visits for anyone 16 and older to request care online for non-urgent symptoms. For details visit [email or web address].   Also download the MyChart app! Go to the app store, search "MyChart", open the app, select [facility], and log in with your MyChart username and password.  Doxorubicin Injection What is this medication? DOXORUBICIN (dox oh ROO bi sin) treats some types of cancer. It works by slowing down the growth of cancer cells. This medicine may be used for other purposes; ask your health care provider or pharmacist if you have questions. COMMON BRAND NAME(S): Adriamycin, Adriamycin PFS, Adriamycin RDF, Rubex What should I tell my care team before I take this medication? They need to know if you have any of these conditions: Heart disease History of low blood cell levels caused by  a medication Liver disease Recent or ongoing radiation An unusual or allergic reaction to doxorubicin, other medications, foods, dyes, or preservatives If you or your partner are pregnant or trying to get pregnant Breast-feeding How should I use this medication? This medication is injected into a vein. It is given by your care team  in a hospital or clinic setting. Talk to your care team about the use of this medication in children. Special care may be needed. Overdosage: If you think you have taken too much of this medicine contact a poison control center or emergency room at once. NOTE: This medicine is only for you. Do not share this medicine with others. What if I miss a dose? Keep appointments for follow-up doses. It is important not to miss your dose. Call your care team if you are unable to keep an appointment. What may interact with this medication? 6-mercaptopurine Paclitaxel Phenytoin St. John's wort Trastuzumab Verapamil This list may not describe all possible interactions. Give your health care provider a list of all the medicines, herbs, non-prescription drugs, or dietary supplements you use. Also tell them if you smoke, drink alcohol, or use illegal drugs. Some items may interact with your medicine. What should I watch for while using this medication? Your condition will be monitored carefully while you are receiving this medication. You may need blood work while taking this medication. This medication may make you feel generally unwell. This is not uncommon as chemotherapy can affect healthy cells as well as cancer cells. Report any side effects. Continue your course of treatment even though you feel ill unless your care team tells you to stop. There is a maximum amount of this medication you should receive throughout your life. The amount depends on the medical condition being treated and your overall health. Your care team will watch how much of this medication you receive. Tell your care team if you have taken this medication before. Your urine may turn red for a few days after your dose. This is not blood. If your urine is dark or brown, call your care team. In some cases, you may be given additional medications to help with side effects. Follow all directions for their use. This medication may increase  your risk of getting an infection. Call your care team for advice if you get a fever, chills, sore throat, or other symptoms of a cold or flu. Do not treat yourself. Try to avoid being around people who are sick. This medication may increase your risk to bruise or bleed. Call your care team if you notice any unusual bleeding. Talk to your care team about your risk of cancer. You may be more at risk for certain types of cancers if you take this medication. Talk to your care team if you or your partner may be pregnant. Serious birth defects can occur if you take this medication during pregnancy and for 6 months after the last dose. Contraception is recommended while taking this medication and for 6 months after the last dose. Your care team can help you find the option that works for you. If your partner can get pregnant, use a condom while taking this medication and for 6 months after the last dose. Do not breastfeed while taking this medication. This medication may cause infertility. Talk to your care team if you are concerned about your fertility. What side effects may I notice from receiving this medication? Side effects that you should report to your care team as soon as possible:  Allergic reactions--skin rash, itching, hives, swelling of the face, lips, tongue, or throat Heart failure--shortness of breath, swelling of the ankles, feet, or hands, sudden weight gain, unusual weakness or fatigue Heart rhythm changes--fast or irregular heartbeat, dizziness, feeling faint or lightheaded, chest pain, trouble breathing Infection--fever, chills, cough, sore throat, wounds that don't heal, pain or trouble when passing urine, general feeling of discomfort or being unwell Low red blood cell level--unusual weakness or fatigue, dizziness, headache, trouble breathing Painful swelling, warmth, or redness of the skin, blisters or sores at the infusion site Unusual bruising or bleeding Side effects that usually  do not require medical attention (report to your care team if they continue or are bothersome): Diarrhea Hair loss Nausea Pain, redness, or swelling with sores inside the mouth or throat Red urine This list may not describe all possible side effects. Call your doctor for medical advice about side effects. You may report side effects to FDA at 1-800-FDA-1088. Where should I keep my medication? This medication is given in a hospital or clinic. It will not be stored at home. NOTE: This sheet is a summary. It may not cover all possible information. If you have questions about this medicine, talk to your doctor, pharmacist, or health care provider.  2024 Elsevier/Gold Standard (2022-08-01 00:00:00)  Vinblastine Injection What is this medication? VINBLASTINE (vin BLAS teen) treats some types of cancer. It works by slowing down the growth of cancer cells. This medicine may be used for other purposes; ask your health care provider or pharmacist if you have questions. COMMON BRAND NAME(S): Velban What should I tell my care team before I take this medication? They need to know if you have any of these conditions: Heart disease Infection Liver disease Low white blood cell levels Lung disease An unusual or allergic reaction to vinblastine, other chemotherapy agents, other medications, foods, dyes, or preservatives Pregnant or trying to get pregnant Breast-feeding How should I use this medication? This medication is injected into a vein. It is given by your care team in a hospital or clinic setting. Talk to your care team about the use of this medication in children. While it may be given to children for selected conditions, precautions do apply. Overdosage: If you think you have taken too much of this medicine contact a poison control center or emergency room at once. NOTE: This medicine is only for you. Do not share this medicine with others. What if I miss a dose? Keep appointments for  follow-up doses. It is important not to miss your dose. Call your care team if you are unable to keep an appointment. What may interact with this medication? Erythromycin Phenytoin This medication may affect how other medications work, and other medications may affect the way this medication works. Talk with your care team about all of the medications you take. They may suggest changes to your treatment plan to lower the risk of side effects and to make sure your medications work as intended. This list may not describe all possible interactions. Give your health care provider a list of all the medicines, herbs, non-prescription drugs, or dietary supplements you use. Also tell them if you smoke, drink alcohol, or use illegal drugs. Some items may interact with your medicine. What should I watch for while using this medication? Your condition will be monitored carefully while you are receiving this medication. This medication may make you feel generally unwell. This is not uncommon as chemotherapy can affect healthy cells as well as cancer cells.  Report any side effects. Continue your course of treatment even though you feel ill unless your care team tells you to stop. You may need blood work while taking this medication. This medication will cause constipation. If you do not have a bowel movement for 3 days, call your care team. This medication may increase your risk to bruise or bleed. Call your care team if you notice any unusual bleeding. This medication may increase your risk of getting an infection. Call your care team for advice if you get a fever, chills, sore throat, or other symptoms of a cold or flu. Do not treat yourself. Try to avoid being around people who are sick. Be careful brushing or flossing your teeth or using a toothpick because you may get an infection or bleed more easily. If you have any dental work done, tell your dentist you are receiving this medication. Talk to your care team  if you or your partner wish to become pregnant or think either of you might be pregnant. This medication can cause serious birth defects. This medication may cause infertility. Talk to your care team if you are concerned about your fertility. Talk to your care team before breastfeeding. Changes to your treatment plan may be needed. What side effects may I notice from receiving this medication? Side effects that you should report to your care team as soon as possible: Allergic reactions--skin rash, itching, hives, swelling of the face, lips, tongue, or throat Infection--fever, chills, cough, sore throat, wounds that don't heal, pain or trouble when passing urine, general feeling of discomfort or being unwell Painful swelling, warmth, or redness of the skin, blisters or sores at the infusion site Shortness of breath or trouble breathing Unusual bruising or bleeding Side effects that usually do not require medical attention (report to your care team if they continue or are bothersome): Bone pain Constipation Hair loss Nausea Stomach pain Vomiting This list may not describe all possible side effects. Call your doctor for medical advice about side effects. You may report side effects to FDA at 1-800-FDA-1088. Where should I keep my medication? This medication is given in a hospital or clinic. It will not be stored at home. NOTE: This sheet is a summary. It may not cover all possible information. If you have questions about this medicine, talk to your doctor, pharmacist, or health care provider.  2024 Elsevier/Gold Standard (2021-07-24 00:00:00)  Dacarbazine Injection What is this medication? DACARBAZINE (da KAR ba zeen) treats skin cancer and lymphoma. It works by slowing down the growth of cancer cells. This medicine may be used for other purposes; ask your health care provider or pharmacist if you have questions. COMMON BRAND NAME(S): DTIC-Dome What should I tell my care team before I take  this medication? They need to know if you have any of these conditions: Infection, such as chickenpox, cold sores, herpes Kidney disease Liver disease Low blood cell levels, such as low white cells, platelets, or red blood cells Recent radiation therapy An unusual or allergic reaction to dacarbazine, other medications, foods, dyes, or preservatives Pregnant or trying to get pregnant Breast-feeding How should I use this medication? This medication is given as an injected into a vein. It is given by your care team in a hospital or clinic setting. Talk to your care team about the use of this medication in children. While it may be prescribed for selected conditions, precautions do apply. Overdosage: If you think you have taken too much of this medicine contact a  poison control center or emergency room at once. NOTE: This medicine is only for you. Do not share this medicine with others. What if I miss a dose? Keep appointments for follow-up doses. It is important not to miss your dose. Call your care team if you are unable to keep an appointment. What may interact with this medication? Do not take this medication with any of the following: Live virus vaccines This medication may also interact with the following: Medications to increase blood counts, such as filgrastim, pegfilgrastim, sargramostim This list may not describe all possible interactions. Give your health care provider a list of all the medicines, herbs, non-prescription drugs, or dietary supplements you use. Also tell them if you smoke, drink alcohol, or use illegal drugs. Some items may interact with your medicine. What should I watch for while using this medication? Your condition will be monitored carefully while you are receiving this medication. You may need blood work done while taking this medication. This medication may make you feel generally unwell. This is not uncommon as chemotherapy can affect healthy cells as well as  cancer cells. Report any side effects. Continue your course of treatment even though you feel ill unless your care team tells you to stop. This medication may increase your risk of getting an infection. Call your care team for advice if you get a fever, chills, sore throat, or other symptoms of a cold or flu. Do not treat yourself. Try to avoid being around people who are sick. This medication may increase your risk to bruise or bleed. Call your care team if you notice any unusual bleeding. Talk to your care team about your risk of cancer. You may be more at risk for certain types of cancers if you take this medication. Talk to your care team if you wish to become pregnant or think you might be pregnant. This medication can cause serious birth defects if taken during pregnancy. A reliable form of contraception is recommended while taking this medication. Talk to your care team about effective forms of contraception. Do not breastfeed while taking this medication. What side effects may I notice from receiving this medication? Side effects that you should report to your care team as soon as possible: Allergic reactions--skin rash, itching, hives, swelling of the face, lips, tongue, or throat Infection--fever, chills, cough, sore throat, wounds that don't heal, pain or trouble when passing urine, general feeling of discomfort or being unwell Liver injury--right upper belly pain, loss of appetite, nausea, light-colored stool, dark yellow or brown urine, yellowing skin or eyes, unusual weakness or fatigue Low red blood cell level--unusual weakness or fatigue, dizziness, headache, trouble breathing Painful swelling, warmth, or redness of the skin, blisters or sores at the infusion site Unusual bruising or bleeding Side effects that usually do not require medical attention (report to your care team if they continue or are bothersome): Hair loss Loss of appetite Nausea Vomiting This list may not describe  all possible side effects. Call your doctor for medical advice about side effects. You may report side effects to FDA at 1-800-FDA-1088. Where should I keep my medication? This medication is given in a hospital or clinic. It will not be stored at home. NOTE: This sheet is a summary. It may not cover all possible information. If you have questions about this medicine, talk to your doctor, pharmacist, or health care provider.  2024 Elsevier/Gold Standard (2021-09-04 00:00:00)  Brentuximab Vedotin Injection What is this medication? BRENTUXIMAB VEDOTIN (bren TUX see mab  ve DOE tin) treats lymphoma. It works by blocking a protein that causes cancer cells to grow and multiply. This helps to slow or stop the spread of cancer cells. This medicine may be used for other purposes; ask your health care provider or pharmacist if you have questions. COMMON BRAND NAME(S): ADCETRIS What should I tell my care team before I take this medication? They need to know if you have any of these conditions: Diabetes Kidney disease Liver disease Low white blood cell levels Lung disease Tingling of the fingers or toes or other nerve disorder An unusual or allergic reaction to brentuximab vedotin, other medications, foods, dyes, or preservatives Pregnant or trying to get pregnant Breast-feeding How should I use this medication? This medication is injected into a vein. It is given by your care team in a hospital or clinic setting. Talk to your care team about the use of this medication in children. While it may be given to children as young as 2 years for selected conditions, precautions do apply. Overdosage: If you think you have taken too much of this medicine contact a poison control center or emergency room at once. NOTE: This medicine is only for you. Do not share this medicine with others. What if I miss a dose? Keep appointments for follow-up doses. It is important not to miss your dose. Call your care team if  you are unable to keep an appointment. What may interact with this medication? Do not take this medication with any of the following: Bleomycin This medication may also interact with the following: Ketoconazole Rifampin St. John's Wort This list may not describe all possible interactions. Give your health care provider a list of all the medicines, herbs, non-prescription drugs, or dietary supplements you use. Also tell them if you smoke, drink alcohol, or use illegal drugs. Some items may interact with your medicine. What should I watch for while using this medication? Your condition will be monitored carefully while you are receiving this medication. You may need blood work while taking this medication. This medication may increase your risk of getting an infection. Call your care team for advice if you get a fever, chills, sore throat, or other symptoms of a cold or flu. Do not treat yourself. Try to avoid being around people who are sick. In some patients, this medication may cause a serious brain infection that may cause death. If you have any problems seeing, thinking, speaking, walking, or standing, tell your care team right away. If you cannot reach your care team, urgently seek other source of medical care. This medication may increase blood sugar. The risk may be higher in patients who already have diabetes. Ask your care team what you can do to lower your risk of diabetes while taking this medication. Talk to your care team if you or your partner may be pregnant. You will need a negative pregnancy test before starting this medication. Contraception is recommended while taking this medication and for 2 months after the last dose. Your care team can help you find the option that works for you. Use a condom during sex and for 4 months after stopping therapy. Tell your care team right away if you think your partner might be pregnant. Do not breast-feed while taking this medication. This  medication may cause infertility. Talk to your care team if you are concerned about your fertility. What side effects may I notice from receiving this medication? Side effects that you should report to your care team as  soon as possible: Allergic reactions--skin rash, itching, hives, swelling of the face, lips, tongue, or throat Dizziness, loss of balance or coordination, confusion or trouble speaking High blood sugar (hyperglycemia)--increased thirst or amount of urine, unusual weakness or fatigue, blurry vision Infection--fever, chills, cough, sore throat, wounds that don't heal, pain or trouble when passing urine, general feeling of discomfort or being unwell Liver injury--right upper belly pain, loss of appetite, nausea, light-colored stool, dark yellow or brown urine, yellowing skin or eyes, unusual weakness or fatigue Low red blood cell level--unusual weakness or fatigue, dizziness, headache, trouble breathing Lung injury--shortness of breath or trouble breathing, cough, spitting up blood, chest pain, fever Pain, tingling, or numbness in the hands or feet Pancreatitis--severe stomach pain that spreads to your back or gets worse after eating or when touched, fever, nausea, vomiting Redness, blistering, peeling, or loosening of the skin, including inside the mouth Stomach bleeding--bloody or black, tar-like stools, vomiting blood or brown material that looks like coffee grounds Sudden or severe stomach pain, bloody diarrhea, fever, nausea, vomiting Tumor lysis syndrome (TLS)--nausea, vomiting, diarrhea, decrease in the amount of urine, dark urine, unusual weakness or fatigue, confusion, muscle pain or cramps, fast or irregular heartbeat, joint pain Unusual bruising or bleeding Side effects that usually do not require medical attention (report to your care team if they continue or are bothersome): Cough Diarrhea Fatigue Joint pain Nausea Stomach pain Vomiting This list may not describe  all possible side effects. Call your doctor for medical advice about side effects. You may report side effects to FDA at 1-800-FDA-1088. Where should I keep my medication? This medication is given in a hospital or clinic. It will not be stored at home. NOTE: This sheet is a summary. It may not cover all possible information. If you have questions about this medicine, talk to your doctor, pharmacist, or health care provider.  2024 Elsevier/Gold Standard (2021-11-02 00:00:00)

## 2023-05-19 NOTE — Progress Notes (Signed)
 Patient seen by Dr. Addison Naegeli are within treatment parameters.  Labs reviewed: and are within treatment parameters.  Per physician team, patient is ready for treatment and there are NO modifications to the treatment plan.

## 2023-05-20 ENCOUNTER — Encounter: Payer: Self-pay | Admitting: Hematology

## 2023-05-21 ENCOUNTER — Encounter: Payer: Self-pay | Admitting: Hematology

## 2023-05-21 ENCOUNTER — Inpatient Hospital Stay: Payer: 59

## 2023-05-21 ENCOUNTER — Other Ambulatory Visit: Payer: Self-pay

## 2023-05-21 VITALS — BP 124/75 | HR 88 | Temp 98.3°F | Resp 16

## 2023-05-21 DIAGNOSIS — K31811 Angiodysplasia of stomach and duodenum with bleeding: Secondary | ICD-10-CM | POA: Diagnosis not present

## 2023-05-21 DIAGNOSIS — Z79633 Long term (current) use of mitotic inhibitor: Secondary | ICD-10-CM | POA: Diagnosis not present

## 2023-05-21 DIAGNOSIS — D62 Acute posthemorrhagic anemia: Secondary | ICD-10-CM | POA: Diagnosis not present

## 2023-05-21 DIAGNOSIS — R109 Unspecified abdominal pain: Secondary | ICD-10-CM | POA: Diagnosis not present

## 2023-05-21 DIAGNOSIS — E46 Unspecified protein-calorie malnutrition: Secondary | ICD-10-CM | POA: Diagnosis not present

## 2023-05-21 DIAGNOSIS — Z5112 Encounter for antineoplastic immunotherapy: Secondary | ICD-10-CM | POA: Diagnosis not present

## 2023-05-21 DIAGNOSIS — Z79620 Long term (current) use of immunosuppressive biologic: Secondary | ICD-10-CM | POA: Diagnosis not present

## 2023-05-21 DIAGNOSIS — Z5111 Encounter for antineoplastic chemotherapy: Secondary | ICD-10-CM | POA: Diagnosis not present

## 2023-05-21 DIAGNOSIS — C8178 Other classical Hodgkin lymphoma, lymph nodes of multiple sites: Secondary | ICD-10-CM

## 2023-05-21 DIAGNOSIS — Z79632 Long term (current) use of antitumor antibiotic: Secondary | ICD-10-CM | POA: Diagnosis not present

## 2023-05-21 DIAGNOSIS — Z87891 Personal history of nicotine dependence: Secondary | ICD-10-CM | POA: Diagnosis not present

## 2023-05-21 DIAGNOSIS — R918 Other nonspecific abnormal finding of lung field: Secondary | ICD-10-CM | POA: Diagnosis not present

## 2023-05-21 DIAGNOSIS — Z79899 Other long term (current) drug therapy: Secondary | ICD-10-CM | POA: Diagnosis not present

## 2023-05-21 DIAGNOSIS — M549 Dorsalgia, unspecified: Secondary | ICD-10-CM | POA: Diagnosis not present

## 2023-05-21 DIAGNOSIS — C8330 Diffuse large B-cell lymphoma, unspecified site: Secondary | ICD-10-CM | POA: Diagnosis not present

## 2023-05-21 DIAGNOSIS — Z5189 Encounter for other specified aftercare: Secondary | ICD-10-CM | POA: Diagnosis not present

## 2023-05-21 DIAGNOSIS — R21 Rash and other nonspecific skin eruption: Secondary | ICD-10-CM | POA: Diagnosis not present

## 2023-05-21 DIAGNOSIS — M545 Low back pain, unspecified: Secondary | ICD-10-CM | POA: Diagnosis not present

## 2023-05-21 DIAGNOSIS — Z79630 Long term (current) use of alkylating agent: Secondary | ICD-10-CM | POA: Diagnosis not present

## 2023-05-21 DIAGNOSIS — Z803 Family history of malignant neoplasm of breast: Secondary | ICD-10-CM | POA: Diagnosis not present

## 2023-05-21 DIAGNOSIS — Z681 Body mass index (BMI) 19 or less, adult: Secondary | ICD-10-CM | POA: Diagnosis not present

## 2023-05-21 MED ORDER — PEGFILGRASTIM-JMDB 6 MG/0.6ML ~~LOC~~ SOSY
6.0000 mg | PREFILLED_SYRINGE | Freq: Once | SUBCUTANEOUS | Status: AC
  Administered 2023-05-21: 6 mg via SUBCUTANEOUS
  Filled 2023-05-21: qty 0.6

## 2023-05-21 NOTE — Progress Notes (Signed)
 HEMATOLOGY/ONCOLOGY CLINIC NOTE  Date of Service: .05/19/2023    Patient Care Team: Patient, No Pcp Per as PCP - General (General Practice)  CHIEF COMPLAINTS/PURPOSE OF CONSULTATION:  Follow-up for toxicity check prior to cycle 1 day 15 of A-AVD chemotherapy.  HISTORY OF PRESENTING ILLNESS:   Timothy Hunter is a wonderful 63 y.o. male who has been referred to us  by Dr. 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   Patient presented to the ED on 03/09/2023 for GI bleed/acute blood loss anemia/microcytic anemia/low vitamin B12 levels and was seen by Dr. Odean.   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   Today, he is accompanied by his wife. He reports that he was recently seen in the hospital for anemia, with abdominal pain and black stools. His hgb was in 6s at the time. Patient received a CT scan of the abdomen and chest and was found to have several enlarged lymph glands in the center of his chest, including lung nodules and findings in the upper abdomen, bone, and thoracic spine. A biopsy of lymph node in retroperitoneum did show concerns for lymphoma.  Patient reports that he has otherwise been generally been pretty healthy otherwise. He notes taking medication for arthritis and reports a medical history of glaucoma. He denies any other chronic medical issues and does not take any medications on a regular basis otherwise. Patient denies having any surgeries in the past.   He reports that his lower back was evaluated earlier this year due to lower back pain, and there were not any concerning findings at that time. He denies any pain in the middle back. His back pain has  improved recently. His wife reports that his energy level has improved recently.   Patient does not smoke cigarettes at this time. He previously smoked 2 packs a day since he was 63 years old until about 2010. He reports that he rarely consumes alcohol and only consumes alcohol with social use. Patient does smoke marijuana.  There is a fhx of cancer. His wife reports that he has had family members that have passed away from cancer, but she is unsure of the type of cancer they had. His wife reports that patient's younger and older sister do have a history of cancer, and are in remission at this time. His older sister has breast cancer and breast cancer does run in the family. Patient denies any other medical issues in the family. He denies any blood disorders in the family.   Patient reports that he has not endorsed any black stools since being home from the hospital, and denies any other changes in bowel habits.   Patient's wife reports that patient did receive a blood transfusion while in the hospital, as well as potassium, but does not believe that patient received IV iron . His B12 level was also low at that time. He is not on any iron  or B12 replacement at this time.   He denies having any other symptoms at this time. Patient denies any lightheadedness/dizziness, SOB, chest pain, abdominal pain, or new leg swelling.  His wife reports that he did take one Tramadol  for back pain prevention. He did not have any back pain prior to taking it.   He reports that he  has lost 12 pounds over the course of about 3 months. Patient reports that his eating habits are normal at this time. He does drink two Ensure protein drinks a day and has gained 5-6 pounds recently. He reports that he was not put on any steroids recently.   Patient denies new lumps/bumps. His wife reports that patient has a scar on right forehead, which has been present for a while. He denies any swelling in the groin or testicular  pain/swelling.   INTERVAL HISTORY  Timothy Hunter is a 63 y.o. male here for continued evaluation and management of Hodgkins lymphoma and for toxicity check prior to cycle 1 day 15 of A-AVD chemotherapy. He notes that his abdominal pain and back pain have completely resolved.  He notes his energy level is significantly improving with improvement in his hemoglobin up to 9.6. He has completed his 5 planned Venofer  infusions. No uncontrolled nausea vomiting or diarrhea. No other reported toxicities from his cycle 1 day 1 of treatment for from his G-CSF at this time. He notes that he has gained 7 pounds since his last visit and has been feeling well and that his appetite has returned.   MEDICAL HISTORY:  Past Medical History:  Diagnosis Date   Dyspnea    Glaucoma associated with anomalies of iris     SURGICAL HISTORY: Past Surgical History:  Procedure Laterality Date   BIOPSY  03/11/2023   Procedure: BIOPSY;  Surgeon: Saintclair Jasper, MD;  Location: [facility];  Service: Gastroenterology;;   ESOPHAGOGASTRODUODENOSCOPY (EGD) WITH PROPOFOL  N/A 03/11/2023   Procedure: ESOPHAGOGASTRODUODENOSCOPY (EGD) WITH PROPOFOL ;  Surgeon: Saintclair Jasper, MD;  Location: [facility];  Service: Gastroenterology;  Laterality: N/A;   HOT HEMOSTASIS N/A 03/11/2023   Procedure: HOT HEMOSTASIS (ARGON PLASMA COAGULATION/BICAP);  Surgeon: Saintclair Jasper, MD;  Location: [facility];  Service: Gastroenterology;  Laterality: N/A;   IR IMAGING GUIDED PORT INSERTION  03/26/2023    SOCIAL HISTORY: Social History   Socioeconomic History   Marital status: Widowed    Spouse name: Not on file   Number of children: Not on file   Years of education: Not on file   Highest education level: Not on file  Occupational History   Not on file  Tobacco Use   Smoking status: Former    Passive exposure: Never   Smokeless tobacco: Not on file  Vaping Use   Vaping status: Never Used  Substance and Sexual Activity   Alcohol  use: Yes    Comment: occasionally   Drug use: Yes    Types: Marijuana   Sexual activity: Not on file  Other Topics Concern   Not on file  Social History Narrative   Not on file   Social Drivers of Health   Financial Resource Strain: Not on file  Food Insecurity: No Food Insecurity (03/09/2023)   Hunger Vital Sign    Worried About Running Out of Food in the Last Year: Never true    Ran Out of Food in the Last Year: Never true  Transportation Needs: No Transportation Needs (03/09/2023)   PRAPARE - Administrator, Civil Service (Medical): No    Lack of Transportation (Non-Medical): No  Physical Activity: Not on file  Stress: Not on file  Social Connections: Not on file  Intimate Partner Violence: Not At Risk (03/09/2023)   Humiliation, Afraid, Rape, and Kick questionnaire    Fear of Current or Ex-Partner: No    Emotionally Abused: No    Physically Abused:  No    Sexually Abused: No    FAMILY HISTORY: No family history on file.  ALLERGIES:  has no known allergies.  MEDICATIONS:  Current Outpatient Medications  Medication Sig Dispense Refill   calcium-vitamin D (OSCAL WITH D) 500-5 MG-MCG tablet Take 4 tablets by mouth daily.     Cyanocobalamin  (VITAMIN B-12 PO) Take 1 Dose by mouth daily.     dexamethasone  (DECADRON ) 4 MG tablet Take 2 tablets by mouth once a day for 3 days after chemotherapy. Take with food. 30 tablet 1   feeding supplement (ENSURE ENLIVE / ENSURE PLUS) LIQD Take 237 mLs by mouth 2 (two) times daily between meals. 237 mL 12   ferrous sulfate 325 (65 FE) MG tablet Take 325 mg by mouth daily with breakfast.     latanoprost  (XALATAN ) 0.005 % ophthalmic solution Place 1 drop into both eyes at bedtime.     lidocaine -prilocaine  (EMLA ) cream Apply to affected area once 30 g 3   ondansetron  (ZOFRAN ) 8 MG tablet Take 1 tablet (8 mg total) by mouth every 8 (eight) hours as needed for nausea or vomiting. Start on the third day after chemotherapy. 30 tablet  1   pantoprazole  (PROTONIX ) 40 MG tablet Take 1 tablet (40 mg total) by mouth daily before breakfast. 30 tablet 2   prochlorperazine  (COMPAZINE ) 10 MG tablet Take 1 tablet (10 mg total) by mouth every 6 (six) hours as needed for nausea or vomiting. 30 tablet 1   timolol  (TIMOPTIC ) 0.5 % ophthalmic solution Place 1 drop into both eyes in the morning.     traMADol  (ULTRAM ) 50 MG tablet Take 1 tablet (50 mg total) by mouth every 6 (six) hours as needed. 30 tablet 0   No current facility-administered medications for this visit.    REVIEW OF SYSTEMS:   .10 Point review of Systems was done is negative except as noted above.  PHYSICAL EXAMINATION: ECOG PERFORMANCE STATUS: 2 - Symptomatic, <50% confined to bed  . Vitals:   05/19/23 1208  BP: 129/74  Pulse: 73  Resp: 16  Temp: (!) 97.5 F (36.4 C)  SpO2: 100%    Filed Weights   05/19/23 1208  Weight: 158 lb 3.2 oz (71.8 kg)    .Body mass index is 23.36 kg/m.  SABRA GENERAL:alert, in no acute distress and comfortable SKIN: no acute rashes, no significant lesions EYES: conjunctiva are pink and non-injected, sclera anicteric OROPHARYNX: MMM, no exudates, no oropharyngeal erythema or ulceration NECK: supple, no JVD LYMPH:  no palpable lymphadenopathy in the cervical, axillary or inguinal regions LUNGS: clear to auscultation b/l with normal respiratory effort HEART: regular rate & rhythm ABDOMEN:  normoactive bowel sounds , non tender, not distended. Extremity: no pedal edema PSYCH: alert & oriented x 3 with fluent speech NEURO: no focal motor/sensory deficits    LABORATORY DATA:  I have reviewed the data as listed  .    Latest Ref Rng & Units 05/19/2023   10:38 AM 04/30/2023    8:50 AM 04/22/2023   11:28 AM  CBC  WBC 4.0 - 10.5 K/uL 5.8  10.7  8.5   Hemoglobin 13.0 - 17.0 g/dL 9.6  8.2  8.6   Hematocrit 39.0 - 52.0 % 31.3  28.0  30.3   Platelets 150 - 400 K/uL 249  364  340     .    Latest Ref Rng & Units 05/19/2023    10:38 AM 04/30/2023    8:50 AM 03/20/2023    2:55 PM  CMP  Glucose 70 - 99 mg/dL 897  863  888   BUN 8 - 23 mg/dL 12  8  11    Creatinine 0.61 - 1.24 mg/dL 9.01  9.14  8.95   Sodium 135 - 145 mmol/L 139  136  137   Potassium 3.5 - 5.1 mmol/L 3.5  3.4  3.5   Chloride 98 - 111 mmol/L 106  103  101   CO2 22 - 32 mmol/L 27  26  29    Calcium 8.9 - 10.3 mg/dL 9.0  9.3  9.6   Total Protein 6.5 - 8.1 g/dL 7.5  8.1  9.1   Total Bilirubin 0.0 - 1.2 mg/dL 0.2  0.3  0.3   Alkaline Phos 38 - 126 U/L 65  69  62   AST 15 - 41 U/L 25  12  15    ALT 0 - 44 U/L 37  13  12    04/22/2023 right supraclavicular lymph node biopsy:         03/11/2023 biopsy of left retroperitoneal lymph node:     RADIOGRAPHIC STUDIES: I have personally reviewed the radiological images as listed and agreed with the findings in the report. US  CORE BIOPSY (LYMPH NODES) Result Date: 04/22/2023 INDICATION: B-cell lymphoma with prior biopsy of retroperitoneal lymph node on 03/11/2023. Additional molecular analysis requested via biopsy of a hypermetabolic, enlarged right supraclavicular lymph node. EXAM: ULTRASOUND GUIDED CORE BIOPSY OF RIGHT SUPRACLAVICULAR LYMPH NODE MEDICATIONS: None. ANESTHESIA/SEDATION: Moderate (conscious) sedation was employed during this procedure. A total of Versed  2.0 mg and Fentanyl  100 mcg was administered intravenously. Moderate Sedation Time: 15 minutes. The patient's level of consciousness and vital signs were monitored continuously by radiology nursing throughout the procedure under my direct supervision. PROCEDURE: The procedure, risks, benefits, and alternatives were explained to the patient. Questions regarding the procedure were encouraged and answered. The patient understands and consents to the procedure. A time-out was performed prior to initiating the procedure. The right neck was prepped with chlorhexidine in a sterile fashion, and a sterile drape was applied covering the operative field.  A sterile gown and sterile gloves were used for the procedure. Local anesthesia was provided with 1% Lidocaine . After localizing an enlarged right supraclavicular lymph node, an 18 gauge core biopsy device was utilized in obtaining 4 separate samples through different portions of the lymph node. Core biopsy samples were submitted in saline. Additional ultrasound was performed. COMPLICATIONS: None immediate. FINDINGS: Enlarged right supraclavicular lymph node measures approximately 2.5 x 1.4 x 1.8 cm. Solid core biopsy samples were obtained. IMPRESSION: Ultrasound-guided core biopsy performed of an enlarged right supraclavicular lymph node. Electronically Signed   By: Marcey Moan M.D.   On: 04/22/2023 13:39    ASSESSMENT & PLAN:  63 y.o. 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 3. Hx of esophageal candidiasis 4.Back pain due to involvement by lymphoma -- pain now resolved. 5.  Protein calorie malnutrition-improving with lymphoma treatment has gained 7 pounds since his last visit . Wt Readings from Last 3 Encounters:  05/19/23 158 lb 3.2 oz (71.8 kg)  05/09/23 151 lb 9.6 oz (68.8 kg)  05/01/23 152 lb 3.2 oz (69 kg)    PLAN:   -Discussed lab results on 05/19/2023 in detail with patient.  -CBC shows improving anemia with hemoglobin of 9.6 and stable white blood counts and platelets. - CMP stable. -Patient can stop his oral iron  since  his iron  has been adequately replaced IV. -He has tolerated his first cycle of A-AVD chemotherapy and G-CSF without any significant acute treatment related toxicities.  His abdominal pain and back pain have resolved and he is not needing his pain medications at this time. -Proceed with cycle 1 day 15 of A-AVD chemotherapy with the same supportive medications. -We shall see him with his next treatment that is cycle 2 Day 1 of A-AVD chemotherapy if doing well we will switch  to once a month follow-ups.  FOLLOW-UP: Please schedule next 2 cycles of treatment per integrated scheduling with port flush, labs and MD visits   .The total time spent in the appointment was 30 minutes* .  All of the patient's questions were answered with apparent satisfaction. The patient knows to call the clinic with any problems, questions or concerns.   Emaline Saran MD MS AAHIVMS [facility] Hematology/Oncology Physician [facility]  .*Total Encounter Time as defined by the Centers for Medicare and Medicaid Services includes, in addition to the face-to-face time of a patient visit (documented in the note above) non-face-to-face time: obtaining and reviewing outside history, ordering and reviewing medications, tests or procedures, care coordination (communications with other health care professionals or caregivers) and documentation in the medical record.

## 2023-05-30 ENCOUNTER — Telehealth: Payer: Self-pay

## 2023-05-30 NOTE — Telephone Encounter (Signed)
[facility] CSW Progress Note  Clinical Social Worker  attempted to reach patient  to assess pyschosocial needs as he continues treatment. CSW was unable to reach patient, left vm with direct contact.    Marguerita Merles, LCSW Clinical Social Worker [facility]

## 2023-06-02 ENCOUNTER — Inpatient Hospital Stay: Payer: 59

## 2023-06-02 ENCOUNTER — Inpatient Hospital Stay (HOSPITAL_BASED_OUTPATIENT_CLINIC_OR_DEPARTMENT_OTHER): Payer: 59 | Admitting: Hematology

## 2023-06-02 VITALS — BP 136/89 | HR 70 | Temp 97.5°F | Resp 16 | Wt 160.9 lb

## 2023-06-02 DIAGNOSIS — R21 Rash and other nonspecific skin eruption: Secondary | ICD-10-CM | POA: Diagnosis not present

## 2023-06-02 DIAGNOSIS — Z803 Family history of malignant neoplasm of breast: Secondary | ICD-10-CM | POA: Diagnosis not present

## 2023-06-02 DIAGNOSIS — M545 Low back pain, unspecified: Secondary | ICD-10-CM | POA: Diagnosis not present

## 2023-06-02 DIAGNOSIS — Z5112 Encounter for antineoplastic immunotherapy: Secondary | ICD-10-CM | POA: Diagnosis not present

## 2023-06-02 DIAGNOSIS — C8178 Other classical Hodgkin lymphoma, lymph nodes of multiple sites: Secondary | ICD-10-CM | POA: Diagnosis not present

## 2023-06-02 DIAGNOSIS — Z79630 Long term (current) use of alkylating agent: Secondary | ICD-10-CM | POA: Diagnosis not present

## 2023-06-02 DIAGNOSIS — Z87891 Personal history of nicotine dependence: Secondary | ICD-10-CM | POA: Diagnosis not present

## 2023-06-02 DIAGNOSIS — K31811 Angiodysplasia of stomach and duodenum with bleeding: Secondary | ICD-10-CM | POA: Diagnosis not present

## 2023-06-02 DIAGNOSIS — C8330 Diffuse large B-cell lymphoma, unspecified site: Secondary | ICD-10-CM | POA: Diagnosis not present

## 2023-06-02 DIAGNOSIS — M549 Dorsalgia, unspecified: Secondary | ICD-10-CM | POA: Diagnosis not present

## 2023-06-02 DIAGNOSIS — R918 Other nonspecific abnormal finding of lung field: Secondary | ICD-10-CM | POA: Diagnosis not present

## 2023-06-02 DIAGNOSIS — Z95828 Presence of other vascular implants and grafts: Secondary | ICD-10-CM

## 2023-06-02 DIAGNOSIS — Z5111 Encounter for antineoplastic chemotherapy: Secondary | ICD-10-CM

## 2023-06-02 DIAGNOSIS — Z79620 Long term (current) use of immunosuppressive biologic: Secondary | ICD-10-CM | POA: Diagnosis not present

## 2023-06-02 DIAGNOSIS — E46 Unspecified protein-calorie malnutrition: Secondary | ICD-10-CM | POA: Diagnosis not present

## 2023-06-02 DIAGNOSIS — Z79899 Other long term (current) drug therapy: Secondary | ICD-10-CM | POA: Diagnosis not present

## 2023-06-02 DIAGNOSIS — Z5189 Encounter for other specified aftercare: Secondary | ICD-10-CM | POA: Diagnosis not present

## 2023-06-02 DIAGNOSIS — Z681 Body mass index (BMI) 19 or less, adult: Secondary | ICD-10-CM | POA: Diagnosis not present

## 2023-06-02 DIAGNOSIS — Z79633 Long term (current) use of mitotic inhibitor: Secondary | ICD-10-CM | POA: Diagnosis not present

## 2023-06-02 DIAGNOSIS — R109 Unspecified abdominal pain: Secondary | ICD-10-CM | POA: Diagnosis not present

## 2023-06-02 DIAGNOSIS — Z79632 Long term (current) use of antitumor antibiotic: Secondary | ICD-10-CM | POA: Diagnosis not present

## 2023-06-02 DIAGNOSIS — D62 Acute posthemorrhagic anemia: Secondary | ICD-10-CM | POA: Diagnosis not present

## 2023-06-02 LAB — CBC WITH DIFFERENTIAL (CANCER CENTER ONLY)
Abs Immature Granulocytes: 0.03 10*3/uL (ref 0.00–0.07)
Basophils Absolute: 0.1 10*3/uL (ref 0.0–0.1)
Basophils Relative: 1 %
Eosinophils Absolute: 0.1 10*3/uL (ref 0.0–0.5)
Eosinophils Relative: 2 %
HCT: 31.5 % — ABNORMAL LOW (ref 39.0–52.0)
Hemoglobin: 9.4 g/dL — ABNORMAL LOW (ref 13.0–17.0)
Immature Granulocytes: 0 %
Lymphocytes Relative: 26 %
Lymphs Abs: 1.8 10*3/uL (ref 0.7–4.0)
MCH: 23.8 pg — ABNORMAL LOW (ref 26.0–34.0)
MCHC: 29.8 g/dL — ABNORMAL LOW (ref 30.0–36.0)
MCV: 79.7 fL — ABNORMAL LOW (ref 80.0–100.0)
Monocytes Absolute: 0.4 10*3/uL (ref 0.1–1.0)
Monocytes Relative: 5 %
Neutro Abs: 4.6 10*3/uL (ref 1.7–7.7)
Neutrophils Relative %: 66 %
Platelet Count: 171 10*3/uL (ref 150–400)
RBC: 3.95 MIL/uL — ABNORMAL LOW (ref 4.22–5.81)
RDW: 31.8 % — ABNORMAL HIGH (ref 11.5–15.5)
WBC Count: 7 10*3/uL (ref 4.0–10.5)
nRBC: 0 % (ref 0.0–0.2)

## 2023-06-02 LAB — CMP (CANCER CENTER ONLY)
ALT: 34 U/L (ref 0–44)
AST: 20 U/L (ref 15–41)
Albumin: 3.9 g/dL (ref 3.5–5.0)
Alkaline Phosphatase: 75 U/L (ref 38–126)
Anion gap: 5 (ref 5–15)
BUN: 10 mg/dL (ref 8–23)
CO2: 27 mmol/L (ref 22–32)
Calcium: 9 mg/dL (ref 8.9–10.3)
Chloride: 105 mmol/L (ref 98–111)
Creatinine: 0.84 mg/dL (ref 0.61–1.24)
GFR, Estimated: >60 mL/min (ref >60)
Glucose, Bld: 97 mg/dL (ref 70–99)
Potassium: 3.6 mmol/L (ref 3.5–5.1)
Sodium: 137 mmol/L (ref 135–145)
Total Bilirubin: 0.3 mg/dL (ref 0.0–1.2)
Total Protein: 7.1 g/dL (ref 6.5–8.1)

## 2023-06-02 MED ORDER — PALONOSETRON HCL INJECTION 0.25 MG/5ML
0.2500 mg | Freq: Once | INTRAVENOUS | Status: AC
  Administered 2023-06-02: 0.25 mg via INTRAVENOUS
  Filled 2023-06-02: qty 5

## 2023-06-02 MED ORDER — DEXAMETHASONE SODIUM PHOSPHATE 10 MG/ML IJ SOLN
10.0000 mg | Freq: Once | INTRAMUSCULAR | Status: AC
  Administered 2023-06-02: 10 mg via INTRAVENOUS
  Filled 2023-06-02: qty 1

## 2023-06-02 MED ORDER — DACARBAZINE 200 MG IV SOLR
375.0000 mg/m2 | Freq: Once | INTRAVENOUS | Status: AC
  Administered 2023-06-02: 680 mg via INTRAVENOUS
  Filled 2023-06-02: qty 68

## 2023-06-02 MED ORDER — SODIUM CHLORIDE 0.9% FLUSH
10.0000 mL | Freq: Once | INTRAVENOUS | Status: AC
Start: 2023-06-02 — End: 2023-06-02
  Administered 2023-06-02: 10 mL

## 2023-06-02 MED ORDER — CLOTRIMAZOLE-BETAMETHASONE 1-0.05 % EX CREA: 1.0000 | TOPICAL_CREAM | Freq: Two times a day (BID) | CUTANEOUS | 1 refills | Status: AC

## 2023-06-02 MED ORDER — VINBLASTINE SULFATE CHEMO INJECTION 1 MG/ML
6.0000 mg/m2 | Freq: Once | INTRAVENOUS | Status: AC
  Administered 2023-06-02: 10.9 mg via INTRAVENOUS
  Filled 2023-06-02: qty 10.9

## 2023-06-02 MED ORDER — DOXORUBICIN HCL CHEMO IV INJECTION 2 MG/ML
25.0000 mg/m2 | Freq: Once | INTRAVENOUS | Status: AC
  Administered 2023-06-02: 46 mg via INTRAVENOUS
  Filled 2023-06-02: qty 23

## 2023-06-02 MED ORDER — SODIUM CHLORIDE 0.9 % IV SOLN
1.2000 mg/kg | Freq: Once | INTRAVENOUS | Status: AC
  Administered 2023-06-02: 80 mg via INTRAVENOUS
  Filled 2023-06-02: qty 16

## 2023-06-02 MED ORDER — HEPARIN SOD (PORK) LOCK FLUSH 100 UNIT/ML IV SOLN
500.0000 [IU] | Freq: Once | INTRAVENOUS | Status: AC | PRN
  Administered 2023-06-02: 500 [IU]

## 2023-06-02 MED ORDER — SODIUM CHLORIDE 0.9 % IV SOLN
150.0000 mg | Freq: Once | INTRAVENOUS | Status: AC
  Administered 2023-06-02: 150 mg via INTRAVENOUS
  Filled 2023-06-02: qty 150

## 2023-06-02 MED ORDER — SODIUM CHLORIDE 0.9% FLUSH
10.0000 mL | INTRAVENOUS | Status: DC | PRN
  Administered 2023-06-02: 10 mL

## 2023-06-02 MED ORDER — SODIUM CHLORIDE 0.9 % IV SOLN: INTRAVENOUS | Status: DC

## 2023-06-02 MED ORDER — ACETAMINOPHEN 325 MG PO TABS
650.0000 mg | ORAL_TABLET | Freq: Once | ORAL | Status: AC
  Administered 2023-06-02: 650 mg via ORAL
  Filled 2023-06-02: qty 2

## 2023-06-02 MED ORDER — DIPHENHYDRAMINE HCL 50 MG/ML IJ SOLN
50.0000 mg | Freq: Once | INTRAMUSCULAR | Status: AC
  Administered 2023-06-02: 50 mg via INTRAVENOUS
  Filled 2023-06-02: qty 1

## 2023-06-02 NOTE — Progress Notes (Signed)
HEMATOLOGY/ONCOLOGY CLINIC NOTE  Date of Service: 06/02/23    Patient Care Team: Patient, No Pcp Per as PCP - General (General Practice)  CHIEF COMPLAINTS/PURPOSE OF CONSULTATION:  Follow-up for toxicity check prior to cycle 1 day 15 of A-AVD chemotherapy.  HISTORY OF PRESENTING ILLNESS:   Timothy Hunter is a wonderful 63 y.o. male who has been referred to [place] by Dr. 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   Patient presented to the ED on 03/09/2023 for GI bleed/acute blood loss anemia/microcytic anemia/low vitamin B12 levels and was seen by Dr. Pamelia Hoit.   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   Today, he is accompanied by his wife. He reports that he was recently seen in the hospital for anemia, with abdominal pain and black stools. His hgb was in 6s at the time. Patient received a CT scan of the abdomen and chest and was found to have several enlarged lymph glands in the center of his chest, including lung nodules and findings in the upper abdomen, bone, and thoracic spine. A biopsy of lymph node in retroperitoneum did show concerns for lymphoma.  Patient reports that he has otherwise been generally been pretty healthy otherwise. He notes taking medication for arthritis and reports a medical history of glaucoma. He denies any other chronic medical issues and does not take any medications on a regular basis otherwise. Patient denies having any surgeries in the past.   He reports that his lower back was evaluated earlier this year due to lower back pain, and there were not any concerning findings at that time. He denies any pain in the middle back. His back pain has  improved recently. His wife reports that his energy level has improved recently.   Patient does not smoke cigarettes at this time. He previously smoked 2 packs a day since he was 63 years old until about 2010. He reports that he rarely consumes alcohol and only consumes alcohol with social use. Patient does smoke marijuana.  There is a fhx of cancer. His wife reports that he has had family members that have passed away from cancer, but she is unsure of the type of cancer they had. His wife reports that patient's younger and older sister do have a history of cancer, and are in remission at this time. His older sister has breast cancer and breast cancer does run in the family. Patient denies any other medical issues in the family. He denies any blood disorders in the family.   Patient reports that he has not endorsed any black stools since being home from the hospital, and denies any other changes in bowel habits.   Patient's wife reports that patient did receive a blood transfusion while in the hospital, as well as potassium, but does not believe that patient received IV iron. His B12 level was also low at that time. He is not on any iron or B12 replacement at this time.   He denies having any other symptoms at this time. Patient denies any lightheadedness/dizziness, SOB, chest pain, abdominal pain, or new leg swelling.  His wife reports that he did take one Tramadol for back pain prevention. He did not have any back pain prior to taking it.   He reports that he  has lost 12 pounds over the course of about 3 months. Patient reports that his eating habits are normal at this time. He does drink two Ensure protein drinks a day and has gained 5-6 pounds recently. He reports that he was not put on any steroids recently.   Patient denies new lumps/bumps. His wife reports that patient has a scar on right forehead, which has been present for a while. He denies any swelling in the groin or testicular  pain/swelling.   INTERVAL HISTORY  Timothy Hunter is a 63 y.o. male here for continued evaluation and management of Hodgkins lymphoma and for toxicity check prior to cycle 2 day 1 of A-AVD chemotherapy.  Patient was last seen by me on 05/19/2023 and he was doing well overall.   Patient is accompanied by his wife during this visit. Patient notes he has been doing well overall since our last visit. He complains of itchy skin rash around his right shoulder/back and around his left lower back. Patient notes that he had similar skin rashes previously as well. He has not used ointment.   He denies any new infection issues, fever, chills, night sweats, unexpected weight loss, back pain, bone pain, chest pain, abdominal pain, or leg swelling.   Patient has been tolerating his treatment well without any new or severe toxicities.   MEDICAL HISTORY:  Past Medical History:  Diagnosis Date   Dyspnea    Glaucoma associated with anomalies of iris     SURGICAL HISTORY: Past Surgical History:  Procedure Laterality Date   BIOPSY  03/11/2023   Procedure: BIOPSY;  Surgeon: Kerin Salen, MD;  Location: [facility];  Service: Gastroenterology;;   ESOPHAGOGASTRODUODENOSCOPY (EGD) WITH PROPOFOL N/A 03/11/2023   Procedure: ESOPHAGOGASTRODUODENOSCOPY (EGD) WITH PROPOFOL;  Surgeon: Kerin Salen, MD;  Location: [facility];  Service: Gastroenterology;  Laterality: N/A;   HOT HEMOSTASIS N/A 03/11/2023   Procedure: HOT HEMOSTASIS (ARGON PLASMA COAGULATION/BICAP);  Surgeon: Kerin Salen, MD;  Location: [facility];  Service: Gastroenterology;  Laterality: N/A;   IR IMAGING GUIDED PORT INSERTION  03/26/2023    SOCIAL HISTORY: Social History   Socioeconomic History   Marital status: Widowed    Spouse name: Not on file   Number of children: Not on file   Years of education: Not on file   Highest education level: Not on file  Occupational History   Not on file  Tobacco Use   Smoking status: Former     Passive exposure: Never   Smokeless tobacco: Not on file  Vaping Use   Vaping status: Never Used  Substance and Sexual Activity   Alcohol use: Yes    Comment: occasionally   Drug use: Yes    Types: Marijuana   Sexual activity: Not on file  Other Topics Concern   Not on file  Social History Narrative   Not on file   Social Drivers of Health   Financial Resource Strain: Not on file  Food Insecurity: No Food Insecurity (03/09/2023)   Hunger Vital Sign    Worried About Running Out of Food in the Last Year: Never true    Ran Out of Food in the Last Year: Never true  Transportation Needs: No Transportation Needs (03/09/2023)   PRAPARE - Administrator, Civil Service (Medical): No    Lack of Transportation (Non-Medical): No  Physical Activity: Not on file  Stress: Not on file  Social Connections: Not on file  Intimate Partner Violence: Not At Risk (03/09/2023)  Humiliation, Afraid, Rape, and Kick questionnaire    Fear of Current or Ex-Partner: No    Emotionally Abused: No    Physically Abused: No    Sexually Abused: No    FAMILY HISTORY: No family history on file.  ALLERGIES:  has no known allergies.  MEDICATIONS:  Current Outpatient Medications  Medication Sig Dispense Refill   calcium-vitamin D (OSCAL WITH D) 500-5 MG-MCG tablet Take 4 tablets by mouth daily.     Cyanocobalamin (VITAMIN B-12 PO) Take 1 Dose by mouth daily.     dexamethasone (DECADRON) 4 MG tablet Take 2 tablets by mouth once a day for 3 days after chemotherapy. Take with food. 30 tablet 1   feeding supplement (ENSURE ENLIVE / ENSURE PLUS) LIQD Take 237 mLs by mouth 2 (two) times daily between meals. 237 mL 12   ferrous sulfate 325 (65 FE) MG tablet Take 325 mg by mouth daily with breakfast.     latanoprost (XALATAN) 0.005 % ophthalmic solution Place 1 drop into both eyes at bedtime.     lidocaine-prilocaine (EMLA) cream Apply to affected area once 30 g 3   ondansetron (ZOFRAN) 8 MG tablet  Take 1 tablet (8 mg total) by mouth every 8 (eight) hours as needed for nausea or vomiting. Start on the third day after chemotherapy. 30 tablet 1   pantoprazole (PROTONIX) 40 MG tablet Take 1 tablet (40 mg total) by mouth daily before breakfast. 30 tablet 2   prochlorperazine (COMPAZINE) 10 MG tablet Take 1 tablet (10 mg total) by mouth every 6 (six) hours as needed for nausea or vomiting. 30 tablet 1   timolol (TIMOPTIC) 0.5 % ophthalmic solution Place 1 drop into both eyes in the morning.     traMADol (ULTRAM) 50 MG tablet Take 1 tablet (50 mg total) by mouth every 6 (six) hours as needed. 30 tablet 0   No current facility-administered medications for this visit.    REVIEW OF SYSTEMS:   .10 Point review of Systems was done is negative except as noted above.  PHYSICAL EXAMINATION: ECOG PERFORMANCE STATUS: 2 - Symptomatic, <50% confined to bed .BP 136/89 (BP Location: Right Arm, Patient Position: Sitting)   Pulse 70   Temp (!) 97.5 F (36.4 C) (Oral)   Resp 16   Wt 160 lb 14.4 oz (73 kg)   SpO2 100%   BMI 23.76 kg/m   GENERAL:alert, in no acute distress and comfortable SKIN: no acute rashes, no significant lesions EYES: conjunctiva are pink and non-injected, sclera anicteric OROPHARYNX: MMM, no exudates, no oropharyngeal erythema or ulceration NECK: supple, no JVD LYMPH:  no palpable lymphadenopathy in the cervical, axillary or inguinal regions LUNGS: clear to auscultation b/l with normal respiratory effort HEART: regular rate & rhythm ABDOMEN:  normoactive bowel sounds , non tender, not distended. Extremity: no pedal edema PSYCH: alert & oriented x 3 with fluent speech NEURO: no focal motor/sensory deficits  LABORATORY DATA:  I have reviewed the data as listed  .    Latest Ref Rng & Units 05/19/2023   10:38 AM 04/30/2023    8:50 AM 04/22/2023   11:28 AM  CBC  WBC 4.0 - 10.5 K/uL 5.8  10.7  8.5   Hemoglobin 13.0 - 17.0 g/dL 9.6  8.2  8.6   Hematocrit 39.0 - 52.0 % 31.3   28.0  30.3   Platelets 150 - 400 K/uL 249  364  340     .    Latest Ref Rng & Units 05/19/2023  10:38 AM 04/30/2023    8:50 AM 03/20/2023    2:55 PM  CMP  Glucose 70 - 99 mg/dL 161  096  045   BUN 8 - 23 mg/dL 12  8  11    Creatinine 0.61 - 1.24 mg/dL 4.09  8.11  9.14   Sodium 135 - 145 mmol/L 139  136  137   Potassium 3.5 - 5.1 mmol/L 3.5  3.4  3.5   Chloride 98 - 111 mmol/L 106  103  101   CO2 22 - 32 mmol/L 27  26  29    Calcium 8.9 - 10.3 mg/dL 9.0  9.3  9.6   Total Protein 6.5 - 8.1 g/dL 7.5  8.1  9.1   Total Bilirubin 0.0 - 1.2 mg/dL 0.2  0.3  0.3   Alkaline Phos 38 - 126 U/L 65  69  62   AST 15 - 41 U/L 25  12  15    ALT 0 - 44 U/L 37  13  12    04/22/2023 right supraclavicular lymph node biopsy:         03/11/2023 biopsy of left retroperitoneal lymph node:     RADIOGRAPHIC STUDIES: I have personally reviewed the radiological images as listed and agreed with the findings in the report. No results found.   ASSESSMENT & PLAN:   63 y.o. 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 3. Hx of esophageal candidiasis 4.Back pain due to involvement by lymphoma -- pain now resolved. 5.  Protein calorie malnutrition-improving with lymphoma treatment has gained 7 pounds since his last visit . Wt Readings from Last 3 Encounters:  05/19/23 158 lb 3.2 oz (71.8 kg)  05/09/23 151 lb 9.6 oz (68.8 kg)  05/01/23 152 lb 3.2 oz (69 kg)    PLAN:  -Discussed lab results from today, 06/02/2023, in detail with the patient. CBC shows patient is anemic with hemoglobin of 9.4 g/dL with hematocrit of 78.2%, stable overall. CMP pending.  -Will prescribe an anti-fungal ointment for skin rashes.  -He has tolerated his first cycle of A-AVD chemotherapy and G-CSF without any significant acute treatment related toxicities.   -Proceed with cycle 2 day 1 of A-AVD chemotherapy with the same supportive  medications. -Answered all of patient's and his wife's questions.   FOLLOW-UP: Please schedule next 2 cycles of treatment per integrated scheduling with port flush and labs  MD visit every 4 weeks on D1 of each treatment cycle   The total time spent in the appointment was 30 minutes* .  All of the patient's questions were answered with apparent satisfaction. The patient knows to call the clinic with any problems, questions or concerns.   Wyvonnia Lora MD MS AAHIVMS [facility] Hematology/Oncology Physician [facility]  .*Total Encounter Time as defined by the Centers for Medicare and Medicaid Services includes, in addition to the face-to-face time of a patient visit (documented in the note above) non-face-to-face time: obtaining and reviewing outside history, ordering and reviewing medications, tests or procedures, care coordination (communications with other health care professionals or caregivers) and documentation in the medical record.   I,Param Shah,acting as a Neurosurgeon for Wyvonnia Lora, MD.,have documented all relevant documentation on the behalf of Wyvonnia Lora, MD,as directed by  Wyvonnia Lora, MD while in the presence of Wyvonnia Lora, MD.   .I have reviewed the above documentation for accuracy and completeness, and I agree with the  above. .[name] MD

## 2023-06-02 NOTE — Progress Notes (Signed)
Patient seen by Dr. Addison Naegeli are within treatment parameters.  Labs reviewed: and are within treatment parameters. CMP is still pending  Per physician team, patient is ready for treatment and there are NO modifications to the treatment plan.

## 2023-06-02 NOTE — Patient Instructions (Signed)
 [facility] - A DEPT OF [facility]  Discharge Instructions: Thank you for choosing [facility] to provide your oncology and hematology care.   If you have a lab appointment with the Cancer Center, please go directly to the Cancer Center and check in at the registration area.   Wear comfortable clothing and clothing appropriate for easy access to any Portacath or PICC line.   We strive to give you quality time with your provider. You may need to reschedule your appointment if you arrive late (15 or more minutes).  Arriving late affects you and other patients whose appointments are after yours.  Also, if you miss three or more appointments without notifying the office, you may be dismissed from the clinic at the provider's discretion.      For prescription refill requests, have your pharmacy contact our office and allow 72 hours for refills to be completed.    Today you received the following chemotherapy and/or immunotherapy agents: Doxorubicin, Vinblastine, Dacarbazine, Brentuximab Vedotin      To help prevent nausea and vomiting after your treatment, we encourage you to take your nausea medication as directed.  BELOW ARE SYMPTOMS THAT SHOULD BE REPORTED IMMEDIATELY: *FEVER GREATER THAN 100.4 F (38 C) OR HIGHER *CHILLS OR SWEATING *NAUSEA AND VOMITING THAT IS NOT CONTROLLED WITH YOUR NAUSEA MEDICATION *UNUSUAL SHORTNESS OF BREATH *UNUSUAL BRUISING OR BLEEDING *URINARY PROBLEMS (pain or burning when urinating, or frequent urination) *BOWEL PROBLEMS (unusual diarrhea, constipation, pain near the anus) TENDERNESS IN MOUTH AND THROAT WITH OR WITHOUT PRESENCE OF ULCERS (sore throat, sores in mouth, or a toothache) UNUSUAL RASH, SWELLING OR PAIN  UNUSUAL VAGINAL DISCHARGE OR ITCHING   Items with * indicate a potential emergency and should be followed up as soon as possible or go to the Emergency Department if any problems should occur.  Please  show the CHEMOTHERAPY ALERT CARD or IMMUNOTHERAPY ALERT CARD at check-in to the Emergency Department and triage nurse.  Should you have questions after your visit or need to cancel or reschedule your appointment, please contact [facility] - A DEPT OF [facility]  Dept: [phone]  and follow the prompts.  Office hours are 8:00 a.m. to 4:30 p.m. Monday - Friday. Please note that voicemails left after 4:00 p.m. may not be returned until the following business day.  We are closed weekends and major holidays. You have access to a nurse at all times for urgent questions. Please call the main number to the clinic Dept: [phone] and follow the prompts.   For any non-urgent questions, you may also contact your provider using MyChart. We now offer e-Visits for anyone 16 and older to request care online for non-urgent symptoms. For details visit [email or web address].   Also download the MyChart app! Go to the app store, search "MyChart", open the app, select [facility], and log in with your MyChart username and password.  Doxorubicin Injection What is this medication? DOXORUBICIN (dox oh ROO bi sin) treats some types of cancer. It works by slowing down the growth of cancer cells. This medicine may be used for other purposes; ask your health care provider or pharmacist if you have questions. COMMON BRAND NAME(S): Adriamycin, Adriamycin PFS, Adriamycin RDF, Rubex What should I tell my care team before I take this medication? They need to know if you have any of these conditions: Heart disease History of low blood cell levels caused by  a medication Liver disease Recent or ongoing radiation An unusual or allergic reaction to doxorubicin, other medications, foods, dyes, or preservatives If you or your partner are pregnant or trying to get pregnant Breast-feeding How should I use this medication? This medication is injected into a vein. It is given by your care team  in a hospital or clinic setting. Talk to your care team about the use of this medication in children. Special care may be needed. Overdosage: If you think you have taken too much of this medicine contact a poison control center or emergency room at once. NOTE: This medicine is only for you. Do not share this medicine with others. What if I miss a dose? Keep appointments for follow-up doses. It is important not to miss your dose. Call your care team if you are unable to keep an appointment. What may interact with this medication? 6-mercaptopurine Paclitaxel Phenytoin St. John's wort Trastuzumab Verapamil This list may not describe all possible interactions. Give your health care provider a list of all the medicines, herbs, non-prescription drugs, or dietary supplements you use. Also tell them if you smoke, drink alcohol, or use illegal drugs. Some items may interact with your medicine. What should I watch for while using this medication? Your condition will be monitored carefully while you are receiving this medication. You may need blood work while taking this medication. This medication may make you feel generally unwell. This is not uncommon as chemotherapy can affect healthy cells as well as cancer cells. Report any side effects. Continue your course of treatment even though you feel ill unless your care team tells you to stop. There is a maximum amount of this medication you should receive throughout your life. The amount depends on the medical condition being treated and your overall health. Your care team will watch how much of this medication you receive. Tell your care team if you have taken this medication before. Your urine may turn red for a few days after your dose. This is not blood. If your urine is dark or brown, call your care team. In some cases, you may be given additional medications to help with side effects. Follow all directions for their use. This medication may increase  your risk of getting an infection. Call your care team for advice if you get a fever, chills, sore throat, or other symptoms of a cold or flu. Do not treat yourself. Try to avoid being around people who are sick. This medication may increase your risk to bruise or bleed. Call your care team if you notice any unusual bleeding. Talk to your care team about your risk of cancer. You may be more at risk for certain types of cancers if you take this medication. Talk to your care team if you or your partner may be pregnant. Serious birth defects can occur if you take this medication during pregnancy and for 6 months after the last dose. Contraception is recommended while taking this medication and for 6 months after the last dose. Your care team can help you find the option that works for you. If your partner can get pregnant, use a condom while taking this medication and for 6 months after the last dose. Do not breastfeed while taking this medication. This medication may cause infertility. Talk to your care team if you are concerned about your fertility. What side effects may I notice from receiving this medication? Side effects that you should report to your care team as soon as possible:  Allergic reactions--skin rash, itching, hives, swelling of the face, lips, tongue, or throat Heart failure--shortness of breath, swelling of the ankles, feet, or hands, sudden weight gain, unusual weakness or fatigue Heart rhythm changes--fast or irregular heartbeat, dizziness, feeling faint or lightheaded, chest pain, trouble breathing Infection--fever, chills, cough, sore throat, wounds that don't heal, pain or trouble when passing urine, general feeling of discomfort or being unwell Low red blood cell level--unusual weakness or fatigue, dizziness, headache, trouble breathing Painful swelling, warmth, or redness of the skin, blisters or sores at the infusion site Unusual bruising or bleeding Side effects that usually  do not require medical attention (report to your care team if they continue or are bothersome): Diarrhea Hair loss Nausea Pain, redness, or swelling with sores inside the mouth or throat Red urine This list may not describe all possible side effects. Call your doctor for medical advice about side effects. You may report side effects to FDA at 1-800-FDA-1088. Where should I keep my medication? This medication is given in a hospital or clinic. It will not be stored at home. NOTE: This sheet is a summary. It may not cover all possible information. If you have questions about this medicine, talk to your doctor, pharmacist, or health care provider.  2024 Elsevier/Gold Standard (2022-08-01 00:00:00)  Vinblastine Injection What is this medication? VINBLASTINE (vin BLAS teen) treats some types of cancer. It works by slowing down the growth of cancer cells. This medicine may be used for other purposes; ask your health care provider or pharmacist if you have questions. COMMON BRAND NAME(S): Velban What should I tell my care team before I take this medication? They need to know if you have any of these conditions: Heart disease Infection Liver disease Low white blood cell levels Lung disease An unusual or allergic reaction to vinblastine, other chemotherapy agents, other medications, foods, dyes, or preservatives Pregnant or trying to get pregnant Breast-feeding How should I use this medication? This medication is injected into a vein. It is given by your care team in a hospital or clinic setting. Talk to your care team about the use of this medication in children. While it may be given to children for selected conditions, precautions do apply. Overdosage: If you think you have taken too much of this medicine contact a poison control center or emergency room at once. NOTE: This medicine is only for you. Do not share this medicine with others. What if I miss a dose? Keep appointments for  follow-up doses. It is important not to miss your dose. Call your care team if you are unable to keep an appointment. What may interact with this medication? Erythromycin Phenytoin This medication may affect how other medications work, and other medications may affect the way this medication works. Talk with your care team about all of the medications you take. They may suggest changes to your treatment plan to lower the risk of side effects and to make sure your medications work as intended. This list may not describe all possible interactions. Give your health care provider a list of all the medicines, herbs, non-prescription drugs, or dietary supplements you use. Also tell them if you smoke, drink alcohol, or use illegal drugs. Some items may interact with your medicine. What should I watch for while using this medication? Your condition will be monitored carefully while you are receiving this medication. This medication may make you feel generally unwell. This is not uncommon as chemotherapy can affect healthy cells as well as cancer cells.  Report any side effects. Continue your course of treatment even though you feel ill unless your care team tells you to stop. You may need blood work while taking this medication. This medication will cause constipation. If you do not have a bowel movement for 3 days, call your care team. This medication may increase your risk to bruise or bleed. Call your care team if you notice any unusual bleeding. This medication may increase your risk of getting an infection. Call your care team for advice if you get a fever, chills, sore throat, or other symptoms of a cold or flu. Do not treat yourself. Try to avoid being around people who are sick. Be careful brushing or flossing your teeth or using a toothpick because you may get an infection or bleed more easily. If you have any dental work done, tell your dentist you are receiving this medication. Talk to your care team  if you or your partner wish to become pregnant or think either of you might be pregnant. This medication can cause serious birth defects. This medication may cause infertility. Talk to your care team if you are concerned about your fertility. Talk to your care team before breastfeeding. Changes to your treatment plan may be needed. What side effects may I notice from receiving this medication? Side effects that you should report to your care team as soon as possible: Allergic reactions--skin rash, itching, hives, swelling of the face, lips, tongue, or throat Infection--fever, chills, cough, sore throat, wounds that don't heal, pain or trouble when passing urine, general feeling of discomfort or being unwell Painful swelling, warmth, or redness of the skin, blisters or sores at the infusion site Shortness of breath or trouble breathing Unusual bruising or bleeding Side effects that usually do not require medical attention (report to your care team if they continue or are bothersome): Bone pain Constipation Hair loss Nausea Stomach pain Vomiting This list may not describe all possible side effects. Call your doctor for medical advice about side effects. You may report side effects to FDA at 1-800-FDA-1088. Where should I keep my medication? This medication is given in a hospital or clinic. It will not be stored at home. NOTE: This sheet is a summary. It may not cover all possible information. If you have questions about this medicine, talk to your doctor, pharmacist, or health care provider.  2024 Elsevier/Gold Standard (2021-07-24 00:00:00)  Dacarbazine Injection What is this medication? DACARBAZINE (da KAR ba zeen) treats skin cancer and lymphoma. It works by slowing down the growth of cancer cells. This medicine may be used for other purposes; ask your health care provider or pharmacist if you have questions. COMMON BRAND NAME(S): DTIC-Dome What should I tell my care team before I take  this medication? They need to know if you have any of these conditions: Infection, such as chickenpox, cold sores, herpes Kidney disease Liver disease Low blood cell levels, such as low white cells, platelets, or red blood cells Recent radiation therapy An unusual or allergic reaction to dacarbazine, other medications, foods, dyes, or preservatives Pregnant or trying to get pregnant Breast-feeding How should I use this medication? This medication is given as an injected into a vein. It is given by your care team in a hospital or clinic setting. Talk to your care team about the use of this medication in children. While it may be prescribed for selected conditions, precautions do apply. Overdosage: If you think you have taken too much of this medicine contact a  poison control center or emergency room at once. NOTE: This medicine is only for you. Do not share this medicine with others. What if I miss a dose? Keep appointments for follow-up doses. It is important not to miss your dose. Call your care team if you are unable to keep an appointment. What may interact with this medication? Do not take this medication with any of the following: Live virus vaccines This medication may also interact with the following: Medications to increase blood counts, such as filgrastim, pegfilgrastim, sargramostim This list may not describe all possible interactions. Give your health care provider a list of all the medicines, herbs, non-prescription drugs, or dietary supplements you use. Also tell them if you smoke, drink alcohol, or use illegal drugs. Some items may interact with your medicine. What should I watch for while using this medication? Your condition will be monitored carefully while you are receiving this medication. You may need blood work done while taking this medication. This medication may make you feel generally unwell. This is not uncommon as chemotherapy can affect healthy cells as well as  cancer cells. Report any side effects. Continue your course of treatment even though you feel ill unless your care team tells you to stop. This medication may increase your risk of getting an infection. Call your care team for advice if you get a fever, chills, sore throat, or other symptoms of a cold or flu. Do not treat yourself. Try to avoid being around people who are sick. This medication may increase your risk to bruise or bleed. Call your care team if you notice any unusual bleeding. Talk to your care team about your risk of cancer. You may be more at risk for certain types of cancers if you take this medication. Talk to your care team if you wish to become pregnant or think you might be pregnant. This medication can cause serious birth defects if taken during pregnancy. A reliable form of contraception is recommended while taking this medication. Talk to your care team about effective forms of contraception. Do not breastfeed while taking this medication. What side effects may I notice from receiving this medication? Side effects that you should report to your care team as soon as possible: Allergic reactions--skin rash, itching, hives, swelling of the face, lips, tongue, or throat Infection--fever, chills, cough, sore throat, wounds that don't heal, pain or trouble when passing urine, general feeling of discomfort or being unwell Liver injury--right upper belly pain, loss of appetite, nausea, light-colored stool, dark yellow or brown urine, yellowing skin or eyes, unusual weakness or fatigue Low red blood cell level--unusual weakness or fatigue, dizziness, headache, trouble breathing Painful swelling, warmth, or redness of the skin, blisters or sores at the infusion site Unusual bruising or bleeding Side effects that usually do not require medical attention (report to your care team if they continue or are bothersome): Hair loss Loss of appetite Nausea Vomiting This list may not describe  all possible side effects. Call your doctor for medical advice about side effects. You may report side effects to FDA at 1-800-FDA-1088. Where should I keep my medication? This medication is given in a hospital or clinic. It will not be stored at home. NOTE: This sheet is a summary. It may not cover all possible information. If you have questions about this medicine, talk to your doctor, pharmacist, or health care provider.  2024 Elsevier/Gold Standard (2021-09-04 00:00:00)  Brentuximab Vedotin Injection What is this medication? BRENTUXIMAB VEDOTIN (bren TUX see mab  ve DOE tin) treats lymphoma. It works by blocking a protein that causes cancer cells to grow and multiply. This helps to slow or stop the spread of cancer cells. This medicine may be used for other purposes; ask your health care provider or pharmacist if you have questions. COMMON BRAND NAME(S): ADCETRIS What should I tell my care team before I take this medication? They need to know if you have any of these conditions: Diabetes Kidney disease Liver disease Low white blood cell levels Lung disease Tingling of the fingers or toes or other nerve disorder An unusual or allergic reaction to brentuximab vedotin, other medications, foods, dyes, or preservatives Pregnant or trying to get pregnant Breast-feeding How should I use this medication? This medication is injected into a vein. It is given by your care team in a hospital or clinic setting. Talk to your care team about the use of this medication in children. While it may be given to children as young as 2 years for selected conditions, precautions do apply. Overdosage: If you think you have taken too much of this medicine contact a poison control center or emergency room at once. NOTE: This medicine is only for you. Do not share this medicine with others. What if I miss a dose? Keep appointments for follow-up doses. It is important not to miss your dose. Call your care team if  you are unable to keep an appointment. What may interact with this medication? Do not take this medication with any of the following: Bleomycin This medication may also interact with the following: Ketoconazole Rifampin St. John's Wort This list may not describe all possible interactions. Give your health care provider a list of all the medicines, herbs, non-prescription drugs, or dietary supplements you use. Also tell them if you smoke, drink alcohol, or use illegal drugs. Some items may interact with your medicine. What should I watch for while using this medication? Your condition will be monitored carefully while you are receiving this medication. You may need blood work while taking this medication. This medication may increase your risk of getting an infection. Call your care team for advice if you get a fever, chills, sore throat, or other symptoms of a cold or flu. Do not treat yourself. Try to avoid being around people who are sick. In some patients, this medication may cause a serious brain infection that may cause death. If you have any problems seeing, thinking, speaking, walking, or standing, tell your care team right away. If you cannot reach your care team, urgently seek other source of medical care. This medication may increase blood sugar. The risk may be higher in patients who already have diabetes. Ask your care team what you can do to lower your risk of diabetes while taking this medication. Talk to your care team if you or your partner may be pregnant. You will need a negative pregnancy test before starting this medication. Contraception is recommended while taking this medication and for 2 months after the last dose. Your care team can help you find the option that works for you. Use a condom during sex and for 4 months after stopping therapy. Tell your care team right away if you think your partner might be pregnant. Do not breast-feed while taking this medication. This  medication may cause infertility. Talk to your care team if you are concerned about your fertility. What side effects may I notice from receiving this medication? Side effects that you should report to your care team as  soon as possible: Allergic reactions--skin rash, itching, hives, swelling of the face, lips, tongue, or throat Dizziness, loss of balance or coordination, confusion or trouble speaking High blood sugar (hyperglycemia)--increased thirst or amount of urine, unusual weakness or fatigue, blurry vision Infection--fever, chills, cough, sore throat, wounds that don't heal, pain or trouble when passing urine, general feeling of discomfort or being unwell Liver injury--right upper belly pain, loss of appetite, nausea, light-colored stool, dark yellow or brown urine, yellowing skin or eyes, unusual weakness or fatigue Low red blood cell level--unusual weakness or fatigue, dizziness, headache, trouble breathing Lung injury--shortness of breath or trouble breathing, cough, spitting up blood, chest pain, fever Pain, tingling, or numbness in the hands or feet Pancreatitis--severe stomach pain that spreads to your back or gets worse after eating or when touched, fever, nausea, vomiting Redness, blistering, peeling, or loosening of the skin, including inside the mouth Stomach bleeding--bloody or black, tar-like stools, vomiting blood or brown material that looks like coffee grounds Sudden or severe stomach pain, bloody diarrhea, fever, nausea, vomiting Tumor lysis syndrome (TLS)--nausea, vomiting, diarrhea, decrease in the amount of urine, dark urine, unusual weakness or fatigue, confusion, muscle pain or cramps, fast or irregular heartbeat, joint pain Unusual bruising or bleeding Side effects that usually do not require medical attention (report to your care team if they continue or are bothersome): Cough Diarrhea Fatigue Joint pain Nausea Stomach pain Vomiting This list may not describe  all possible side effects. Call your doctor for medical advice about side effects. You may report side effects to FDA at 1-800-FDA-1088. Where should I keep my medication? This medication is given in a hospital or clinic. It will not be stored at home. NOTE: This sheet is a summary. It may not cover all possible information. If you have questions about this medicine, talk to your doctor, pharmacist, or health care provider.  2024 Elsevier/Gold Standard (2021-11-02 00:00:00)

## 2023-06-04 ENCOUNTER — Inpatient Hospital Stay: Payer: 59

## 2023-06-04 VITALS — BP 128/67 | HR 84 | Temp 98.4°F | Resp 16

## 2023-06-04 DIAGNOSIS — Z5111 Encounter for antineoplastic chemotherapy: Secondary | ICD-10-CM | POA: Diagnosis not present

## 2023-06-04 DIAGNOSIS — D62 Acute posthemorrhagic anemia: Secondary | ICD-10-CM | POA: Diagnosis not present

## 2023-06-04 DIAGNOSIS — Z681 Body mass index (BMI) 19 or less, adult: Secondary | ICD-10-CM | POA: Diagnosis not present

## 2023-06-04 DIAGNOSIS — Z5112 Encounter for antineoplastic immunotherapy: Secondary | ICD-10-CM | POA: Diagnosis not present

## 2023-06-04 DIAGNOSIS — C8178 Other classical Hodgkin lymphoma, lymph nodes of multiple sites: Secondary | ICD-10-CM

## 2023-06-04 DIAGNOSIS — K31811 Angiodysplasia of stomach and duodenum with bleeding: Secondary | ICD-10-CM | POA: Diagnosis not present

## 2023-06-04 DIAGNOSIS — M545 Low back pain, unspecified: Secondary | ICD-10-CM | POA: Diagnosis not present

## 2023-06-04 DIAGNOSIS — Z79632 Long term (current) use of antitumor antibiotic: Secondary | ICD-10-CM | POA: Diagnosis not present

## 2023-06-04 DIAGNOSIS — Z79899 Other long term (current) drug therapy: Secondary | ICD-10-CM | POA: Diagnosis not present

## 2023-06-04 DIAGNOSIS — R918 Other nonspecific abnormal finding of lung field: Secondary | ICD-10-CM | POA: Diagnosis not present

## 2023-06-04 DIAGNOSIS — Z79630 Long term (current) use of alkylating agent: Secondary | ICD-10-CM | POA: Diagnosis not present

## 2023-06-04 DIAGNOSIS — C8330 Diffuse large B-cell lymphoma, unspecified site: Secondary | ICD-10-CM | POA: Diagnosis not present

## 2023-06-04 DIAGNOSIS — R21 Rash and other nonspecific skin eruption: Secondary | ICD-10-CM | POA: Diagnosis not present

## 2023-06-04 DIAGNOSIS — Z5189 Encounter for other specified aftercare: Secondary | ICD-10-CM | POA: Diagnosis not present

## 2023-06-04 DIAGNOSIS — Z79620 Long term (current) use of immunosuppressive biologic: Secondary | ICD-10-CM | POA: Diagnosis not present

## 2023-06-04 DIAGNOSIS — M549 Dorsalgia, unspecified: Secondary | ICD-10-CM | POA: Diagnosis not present

## 2023-06-04 DIAGNOSIS — E46 Unspecified protein-calorie malnutrition: Secondary | ICD-10-CM | POA: Diagnosis not present

## 2023-06-04 DIAGNOSIS — Z803 Family history of malignant neoplasm of breast: Secondary | ICD-10-CM | POA: Diagnosis not present

## 2023-06-04 DIAGNOSIS — Z87891 Personal history of nicotine dependence: Secondary | ICD-10-CM | POA: Diagnosis not present

## 2023-06-04 DIAGNOSIS — Z79633 Long term (current) use of mitotic inhibitor: Secondary | ICD-10-CM | POA: Diagnosis not present

## 2023-06-04 DIAGNOSIS — R109 Unspecified abdominal pain: Secondary | ICD-10-CM | POA: Diagnosis not present

## 2023-06-04 MED ORDER — PEGFILGRASTIM-JMDB 6 MG/0.6ML ~~LOC~~ SOSY
6.0000 mg | PREFILLED_SYRINGE | Freq: Once | SUBCUTANEOUS | Status: AC
Start: 2023-06-04 — End: 2023-06-04
  Administered 2023-06-04: 6 mg via SUBCUTANEOUS
  Filled 2023-06-04: qty 0.6

## 2023-06-05 ENCOUNTER — Other Ambulatory Visit: Payer: Self-pay

## 2023-06-06 ENCOUNTER — Encounter: Payer: Self-pay | Admitting: Hematology

## 2023-06-10 ENCOUNTER — Other Ambulatory Visit: Payer: Self-pay

## 2023-06-10 MED ORDER — TRAMADOL HCL 50 MG PO TABS: 50.0000 mg | ORAL_TABLET | Freq: Four times a day (QID) | ORAL | 0 refills | Status: DC | PRN

## 2023-06-11 ENCOUNTER — Emergency Department (HOSPITAL_COMMUNITY): Payer: 59

## 2023-06-11 ENCOUNTER — Other Ambulatory Visit: Payer: Self-pay

## 2023-06-11 ENCOUNTER — Telehealth: Payer: Self-pay

## 2023-06-11 ENCOUNTER — Encounter (HOSPITAL_COMMUNITY): Payer: Self-pay | Admitting: Emergency Medicine

## 2023-06-11 ENCOUNTER — Emergency Department (HOSPITAL_COMMUNITY)
Admission: EM | Admit: 2023-06-11 | Discharge: 2023-06-12 | Disposition: A | Discharge status: Home / Self Care | Payer: 59 | Attending: Student | Admitting: Student

## 2023-06-11 DIAGNOSIS — R590 Localized enlarged lymph nodes: Secondary | ICD-10-CM | POA: Diagnosis not present

## 2023-06-11 DIAGNOSIS — C8590 Non-Hodgkin lymphoma, unspecified, unspecified site: Secondary | ICD-10-CM | POA: Diagnosis not present

## 2023-06-11 DIAGNOSIS — R109 Unspecified abdominal pain: Secondary | ICD-10-CM | POA: Diagnosis not present

## 2023-06-11 DIAGNOSIS — Z0189 Encounter for other specified special examinations: Secondary | ICD-10-CM | POA: Diagnosis not present

## 2023-06-11 DIAGNOSIS — E876 Hypokalemia: Secondary | ICD-10-CM | POA: Diagnosis not present

## 2023-06-11 DIAGNOSIS — Z20822 Contact with and (suspected) exposure to covid-19: Secondary | ICD-10-CM | POA: Diagnosis not present

## 2023-06-11 DIAGNOSIS — K922 Gastrointestinal hemorrhage, unspecified: Secondary | ICD-10-CM | POA: Insufficient documentation

## 2023-06-11 DIAGNOSIS — Z87891 Personal history of nicotine dependence: Secondary | ICD-10-CM | POA: Diagnosis not present

## 2023-06-11 DIAGNOSIS — I700 Atherosclerosis of aorta: Secondary | ICD-10-CM | POA: Diagnosis not present

## 2023-06-11 LAB — COMPREHENSIVE METABOLIC PANEL
ALT: 22 U/L (ref 0–44)
AST: 21 U/L (ref 15–41)
Albumin: 4 g/dL (ref 3.5–5.0)
Alkaline Phosphatase: 73 U/L (ref 38–126)
Anion gap: 14 (ref 5–15)
BUN: 13 mg/dL (ref 8–23)
CO2: 20 mmol/L — ABNORMAL LOW (ref 22–32)
Calcium: 9.6 mg/dL (ref 8.9–10.3)
Chloride: 103 mmol/L (ref 98–111)
Creatinine, Ser: 1.09 mg/dL (ref 0.61–1.24)
GFR, Estimated: >60 mL/min (ref >60)
Glucose, Bld: 144 mg/dL — ABNORMAL HIGH (ref 70–99)
Potassium: 3.1 mmol/L — ABNORMAL LOW (ref 3.5–5.1)
Sodium: 137 mmol/L (ref 135–145)
Total Bilirubin: 0.3 mg/dL (ref 0.0–1.2)
Total Protein: 8.2 g/dL — ABNORMAL HIGH (ref 6.5–8.1)

## 2023-06-11 LAB — I-STAT CG4 LACTIC ACID, ED
Lactic Acid, Venous: 3.5 mmol/L (ref 0.5–1.9)
Lactic Acid, Venous: 1.9 mmol/L (ref 0.5–1.9)

## 2023-06-11 LAB — TYPE AND SCREEN
ABO/RH(D): O POS
Antibody Screen: NEGATIVE

## 2023-06-11 LAB — RESP PANEL BY RT-PCR (RSV, FLU A&B, COVID)  RVPGX2
Influenza A by PCR: NEGATIVE
Influenza B by PCR: NEGATIVE
Resp Syncytial Virus by PCR: NEGATIVE
SARS Coronavirus 2 by RT PCR: NEGATIVE

## 2023-06-11 LAB — CBC
HCT: 36.3 % — ABNORMAL LOW (ref 39.0–52.0)
Hemoglobin: 10.9 g/dL — ABNORMAL LOW (ref 13.0–17.0)
MCH: 24.3 pg — ABNORMAL LOW (ref 26.0–34.0)
MCHC: 30 g/dL (ref 30.0–36.0)
MCV: 80.8 fL (ref 80.0–100.0)
Platelets: 280 10*3/uL (ref 150–400)
RBC: 4.49 MIL/uL (ref 4.22–5.81)
RDW: 31.2 % — ABNORMAL HIGH (ref 11.5–15.5)
WBC: 7.9 10*3/uL (ref 4.0–10.5)
nRBC: 0 % (ref 0.0–0.2)

## 2023-06-11 MED ORDER — METRONIDAZOLE 500 MG/100ML IV SOLN: 500.0000 mg | Freq: Once | INTRAVENOUS | Status: DC

## 2023-06-11 MED ORDER — IOHEXOL 350 MG/ML SOLN
100.0000 mL | Freq: Once | INTRAVENOUS | Status: AC | PRN
  Administered 2023-06-11: 100 mL via INTRAVENOUS

## 2023-06-11 MED ORDER — MORPHINE SULFATE (PF) 4 MG/ML IV SOLN
4.0000 mg | Freq: Once | INTRAVENOUS | Status: AC
  Administered 2023-06-11: 4 mg via INTRAVENOUS
  Filled 2023-06-11: qty 1

## 2023-06-11 MED ORDER — LACTATED RINGERS IV BOLUS (SEPSIS)
250.0000 mL | Freq: Once | INTRAVENOUS | Status: AC
  Administered 2023-06-11: 250 mL via INTRAVENOUS

## 2023-06-11 MED ORDER — ONDANSETRON HCL 4 MG/2ML IJ SOLN
4.0000 mg | Freq: Once | INTRAMUSCULAR | Status: AC
  Administered 2023-06-11: 4 mg via INTRAVENOUS
  Filled 2023-06-11: qty 2

## 2023-06-11 MED ORDER — SODIUM CHLORIDE 0.9 % IV SOLN
2.0000 g | Freq: Once | INTRAVENOUS | Status: AC
  Administered 2023-06-11: 2 g via INTRAVENOUS
  Filled 2023-06-11: qty 12.5

## 2023-06-11 MED ORDER — LACTATED RINGERS IV BOLUS (SEPSIS)
1000.0000 mL | Freq: Once | INTRAVENOUS | Status: AC
  Administered 2023-06-11: 1000 mL via INTRAVENOUS

## 2023-06-11 MED ORDER — LACTATED RINGERS IV SOLN: INTRAVENOUS | Status: DC

## 2023-06-11 NOTE — ED Provider Triage Note (Signed)
Emergency Medicine Provider Triage Evaluation Note  Timothy Hunter , a 63 y.o. male  was evaluated in triage.  Pt complains of abdominal pain.  Patient reports generalized abdominal pain for the past 7 days.  Patient's daughter is at bedside and said the pain started after going to [facility].  He had not a bowel movement until today which was black.  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  States this pain feels similar.  Review of Systems  Positive: Generalized abdominal pain Negative: Nausea/vomiting/diarrhea  Physical Exam  BP (!) 145/83 (BP Location: Left Arm)   Pulse (!) 136   Temp 99.4 F (37.4 C) (Oral)   Resp 18   Ht 5\' 9"  (1.753 m)   Wt 72.6 kg   SpO2 100%   BMI 23.63 kg/m  Gen:   Awake, no distress   Resp:  Normal effort  MSK:   Moves extremities without difficulty  Other:    Medical Decision Making  Medically screening exam initiated at 7:08 PM.  Appropriate orders placed.  Henderson Cloud was informed that the remainder of the evaluation will be completed by another provider, this initial triage assessment does not replace that evaluation, and the importance of remaining in the ED until their evaluation is complete.   Maxwell Marion, PA-C 06/11/23 1911

## 2023-06-11 NOTE — ED Provider Notes (Signed)
F/u on imaging If pain uncontrolled, may need admission   Zadie Rhine, MD 06/11/23 [id number]

## 2023-06-11 NOTE — Telephone Encounter (Signed)
Returned call to pt regarding pain in abd. Pt stated he has had pain in his abd for the last few days and today had a very dark stool. Per Dr Candise Che given pt's prior hx of GI bleed, pt encouraged to go to the ED to be evaluated. Pt stated that he would go.

## 2023-06-11 NOTE — ED Triage Notes (Signed)
Patient to ED by POV with c/o GI bleed. He states since Saturday he has not been able to use bathroom and when he did it was dark and and bloody. He reports having ABD surgery 2 month ago for ruptured arteries. He is also a CX patient.

## 2023-06-11 NOTE — ED Provider Notes (Signed)
[place] EMERGENCY DEPARTMENT AT [facility] Provider Note  CSN: [id number] Arrival date & time: 06/11/23 1827  Chief Complaint(s) GI Bleeding  HPI Timothy Hunter is a 63 y.o. 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  Patient states that he has had difficulty using the restroom and has had pain worsening for the last 5 days.  Denies associated chest pain, shortness of breath, headache, fever or other systemic symptoms.  Patient arrives very tachycardic but is otherwise hemodynamically unstable.    Past Medical History Past Medical History:  Diagnosis Date   Dyspnea    Glaucoma associated with anomalies of iris    Patient Active Problem List   Diagnosis Date Noted   Port-A-Cath in place 04/30/2023   Hodgkin's lymphoma (HCC) 04/23/2023   Lymphoma (HCC) 04/17/2023   Iron deficiency anemia 03/27/2023   Symptomatic anemia 03/10/2023   Acute blood loss anemia 03/10/2023   Abnormal CT scan 03/10/2023   Upper GI bleed 03/10/2023   Metastatic disease (HCC) 03/09/2023   GI bleeding 03/09/2023   Glaucoma 03/09/2023   Glaucoma associated with anomalies of iris 03/09/2023   Home Medication(s) Prior to Admission medications   Medication Sig Start Date End Date Taking? Authorizing Provider  calcium-vitamin D (OSCAL WITH D) 500-5 MG-MCG tablet Take 4 tablets by mouth daily.    [provider]  clotrimazole-betamethasone (LOTRISONE) cream Apply 1 Application topically 2 (two) times daily. To area of rash on shoulder and back. 06/02/23   [name], MD  Cyanocobalamin (VITAMIN B-12 PO) Take 1 Dose by mouth daily.    [provider]  dexamethasone (DECADRON) 4 MG tablet Take 2 tablets by mouth once a day for 3 days after chemotherapy. Take with food. 04/23/23    [name], MD  feeding supplement (ENSURE ENLIVE / ENSURE PLUS) LIQD Take 237 mLs by mouth 2 (two) times daily between meals. 03/12/23   Kathlen Mody, MD  ferrous sulfate 325 (65 FE) MG tablet Take 325 mg by mouth daily with breakfast.    [provider]  latanoprost (XALATAN) 0.005 % ophthalmic solution Place 1 drop into both eyes at bedtime. 10/01/20   [provider]  lidocaine-prilocaine (EMLA) cream Apply to affected area once 04/23/23   [name], MD  ondansetron (ZOFRAN) 8 MG tablet Take 1 tablet (8 mg total) by mouth every 8 (eight) hours as needed for nausea or vomiting. Start on the third day after chemotherapy. 04/23/23   [name], MD  pantoprazole (PROTONIX) 40 MG tablet Take 1 tablet (40 mg total) by mouth daily before breakfast. 04/09/23   [name], MD  prochlorperazine (COMPAZINE) 10 MG tablet Take 1 tablet (10 mg total) by mouth every 6 (six) hours as needed for nausea or vomiting. 04/23/23   [name], MD  timolol (TIMOPTIC) 0.5 % ophthalmic solution Place 1 drop into both eyes in the morning. 10/01/20   [provider]  traMADol (ULTRAM) 50 MG tablet Take 1 tablet (50 mg total) by mouth every 6 (six) hours as needed. 06/10/23   [name], MD  Past Surgical History Past Surgical History:  Procedure Laterality Date   BIOPSY  03/11/2023   Procedure: BIOPSY;  Surgeon: Kerin Salen, MD;  Location: [facility];  Service: Gastroenterology;;   ESOPHAGOGASTRODUODENOSCOPY (EGD) WITH PROPOFOL N/A 03/11/2023   Procedure: ESOPHAGOGASTRODUODENOSCOPY (EGD) WITH PROPOFOL;  Surgeon: Kerin Salen, MD;  Location: [facility];  Service: Gastroenterology;  Laterality: N/A;   HOT HEMOSTASIS N/A 03/11/2023   Procedure: HOT HEMOSTASIS (ARGON PLASMA COAGULATION/BICAP);  Surgeon: Kerin Salen, MD;  Location: [facility];  Service: Gastroenterology;  Laterality: N/A;   IR IMAGING GUIDED PORT INSERTION  03/26/2023   Family History No family history on file.  Social History Social History   Tobacco Use   Smoking status: Former    Passive exposure: Never  Advertising account planner   Vaping status: Never Used  Substance Use Topics   Alcohol use: Yes    Comment: occasionally   Drug use: Yes    Types: Marijuana   Allergies Patient has no known allergies.  Review of Systems Review of Systems  Gastrointestinal:  Positive for abdominal pain and blood in stool.    Physical Exam Vital Signs  I have reviewed the triage vital signs BP (!) 145/83 (BP Location: Left Arm)   Pulse (!) 136   Temp 99.4 F (37.4 C) (Oral)   Resp 18   Ht 5\' 9"  (1.753 m)   Wt 72.6 kg   SpO2 100%   BMI 23.63 kg/m   Physical Exam Vitals and nursing note reviewed.  Constitutional:      General: He is not in acute distress.    Appearance: He is well-developed.  HENT:     Head: Normocephalic and atraumatic.  Eyes:     Conjunctiva/sclera: Conjunctivae normal.  Cardiovascular:     Rate and Rhythm: Normal rate and regular rhythm.     Heart sounds: No murmur heard. Pulmonary:     Effort: Pulmonary effort is normal. No respiratory distress.     Breath sounds: Normal breath sounds.  Abdominal:     General: There is distension.     Palpations: Abdomen is soft.     Tenderness: There is abdominal tenderness.  Musculoskeletal:        General: No swelling.     Cervical back: Neck supple.  Skin:    General: Skin is warm and dry.     Capillary Refill: Capillary refill takes less than 2 seconds.  Neurological:     Mental Status: He is alert.  Psychiatric:        Mood and Affect: Mood normal.     ED Results and Treatments Labs (all labs ordered are listed, but only abnormal results are displayed) Labs Reviewed  COMPREHENSIVE METABOLIC PANEL - Abnormal; Notable for the following components:       Result Value   Potassium 3.1 (*)    CO2 20 (*)    Glucose, Bld 144 (*)    Total Protein 8.2 (*)    All other components within normal limits  CBC - Abnormal; Notable for the following components:   Hemoglobin 10.9 (*)    HCT 36.3 (*)    MCH 24.3 (*)    RDW 31.2 (*)    All other components within normal limits  I-STAT CG4 LACTIC ACID, ED - Abnormal; Notable for the following components:   Lactic Acid, Venous 3.5 (*)    All other components within normal limits  CULTURE, BLOOD (ROUTINE X 2)  CULTURE, BLOOD (ROUTINE X 2)  RESP PANEL BY RT-PCR (  RSV, FLU A&B, COVID)  RVPGX2  POC OCCULT BLOOD, ED  I-STAT CG4 LACTIC ACID, ED  TYPE AND SCREEN                                                                                                                          Radiology No results found.  Pertinent labs & imaging results that were available during my care of the patient were reviewed by me and considered in my medical decision making (see MDM for details).  Medications Ordered in ED Medications  lactated ringers infusion (has no administration in time range)  lactated ringers bolus 1,000 mL (1,000 mLs Intravenous New Bag/Given 06/11/23 2049)    And  lactated ringers bolus 1,000 mL (1,000 mLs Intravenous New Bag/Given 06/11/23 2050)    And  lactated ringers bolus 250 mL (has no administration in time range)  morphine (PF) 4 MG/ML injection 4 mg (has no administration in time range)  ondansetron (ZOFRAN) injection 4 mg (has no administration in time range)  ceFEPIme (MAXIPIME) 2 g in sodium chloride 0.9 % 100 mL IVPB (2 g Intravenous New Bag/Given 06/11/23 2050)                                                                                                                                     Procedures Procedures  (including critical care time)  Medical Decision Making / ED Course   This patient presents to the ED for concern of ***, this involves an extensive number of treatment  options, and is a complaint that carries with it a high risk of complications and morbidity.  The differential diagnosis includes ***  MDM: ***   Additional history obtained: -Additional history obtained from *** -External records from outside source obtained and reviewed including: Chart review including previous notes, labs, imaging, consultation notes   Lab Tests: -I ordered, reviewed, and interpreted labs.   The pertinent results include:   Labs Reviewed  COMPREHENSIVE METABOLIC PANEL - Abnormal; Notable for the following components:      Result Value   Potassium 3.1 (*)    CO2 20 (*)    Glucose, Bld 144 (*)    Total Protein 8.2 (*)    All other components within normal limits  CBC - Abnormal; Notable for the following components:   Hemoglobin 10.9 (*)    HCT 36.3 (*)    MCH 24.3 (*)  RDW 31.2 (*)    All other components within normal limits  I-STAT CG4 LACTIC ACID, ED - Abnormal; Notable for the following components:   Lactic Acid, Venous 3.5 (*)    All other components within normal limits  CULTURE, BLOOD (ROUTINE X 2)  CULTURE, BLOOD (ROUTINE X 2)  RESP PANEL BY RT-PCR (RSV, FLU A&B, COVID)  RVPGX2  POC OCCULT BLOOD, ED  I-STAT CG4 LACTIC ACID, ED  TYPE AND SCREEN      EKG ***  EKG Interpretation Date/Time:  Wednesday June 11 2023 18:45:48 EST Ventricular Rate:  127 PR Interval:  122 QRS Duration:  91 QT Interval:  306 QTC Calculation: 445 R Axis:   57  Text Interpretation: Sinus tachycardia Probable left atrial enlargement Nonspecific repol abnormality, diffuse leads Confirmed by Gloris Manchester [phone]) on 06/11/2023 6:48:52 PM         Imaging Studies ordered: I ordered imaging studies including *** I independently visualized and interpreted imaging. I agree with the radiologist interpretation   Medicines ordered and prescription drug management: Meds ordered this encounter  Medications   lactated ringers infusion   AND Linked Order Group     lactated ringers bolus 1,000 mL     Total Body Weight basis for 30 mL/kg  bolus delivery:   72.6 kg    lactated ringers bolus 1,000 mL     Total Body Weight basis for 30 mL/kg  bolus delivery:   72.6 kg    lactated ringers bolus 250 mL     Total Body Weight basis for 30 mL/kg  bolus delivery:   72.6 kg   morphine (PF) 4 MG/ML injection 4 mg   ondansetron (ZOFRAN) injection 4 mg   ceFEPIme (MAXIPIME) 2 g in sodium chloride 0.9 % 100 mL IVPB    Antibiotic Indication::   Other Indication (list below)    Other Indication::   intra-abdominal infection   DISCONTD: metroNIDAZOLE (FLAGYL) IVPB 500 mg    Antibiotic Indication::   Intra-abdominal Infection    -I have reviewed the patients home medicines and have made adjustments as needed  Critical interventions ***  Consultations Obtained: I requested consultation with the ***,  and discussed lab and imaging findings as well as pertinent plan - they recommend: ***   Cardiac Monitoring: The patient was maintained on a cardiac monitor.  I personally viewed and interpreted the cardiac monitored which showed an underlying rhythm of: ***  Social Determinants of Health:  Factors impacting patients care include: ***   Reevaluation: After the interventions noted above, I reevaluated the patient and found that they have :{resolved/improved/worsened:23923::"improved"}  Co morbidities that complicate the patient evaluation  Past Medical History:  Diagnosis Date   Dyspnea    Glaucoma associated with anomalies of iris       Dispostion: I considered admission for this patient, ***     Final Clinical Impression(s) / ED Diagnoses Final diagnoses:  None     @PCDICTATION @

## 2023-06-12 MED ORDER — HEPARIN SOD (PORK) LOCK FLUSH 100 UNIT/ML IV SOLN
500.0000 [IU] | Freq: Once | INTRAVENOUS | Status: AC
  Administered 2023-06-12: 500 [IU]
  Filled 2023-06-12: qty 5

## 2023-06-12 MED ORDER — HYDROCODONE-ACETAMINOPHEN 5-325 MG PO TABS: 1.0000 | ORAL_TABLET | Freq: Four times a day (QID) | ORAL | 0 refills | Status: DC | PRN

## 2023-06-12 NOTE — Discharge Instructions (Signed)
SEEK IMMEDIATE MEDICAL ATTENTION IF: The pain does not go away or becomes severe, particularly over the next 8-12 hours.  A temperature above 100.2F develops.  Repeated vomiting occurs (multiple episodes).   Blood is being passed in stools or vomit (bright red or black tarry stools).  Return also if you develop chest pain, difficulty breathing, dizziness or fainting, or become confused, poorly responsive, or inconsolable.

## 2023-06-12 NOTE — ED Provider Notes (Signed)
I assumed care at signout to follow-up on imaging.  No acute findings were found.  Patient reports feeling dramatically improved.  Vital signs have normalized.  Patient is requesting discharge home   EKG Interpretation Date/Time:  Wednesday June 11 2023 18:45:48 EST Ventricular Rate:  127 PR Interval:  122 QRS Duration:  91 QT Interval:  306 QTC Calculation: 445 R Axis:   57  Text Interpretation: Sinus tachycardia Probable left atrial enlargement Nonspecific repol abnormality, diffuse leads Confirmed by Gloris Manchester [phone]) on 06/11/2023 6:48:52 PM        At Patient request, short course of pain medicine has been ordered home-going   Zadie Rhine, MD 06/12/23 [phone]

## 2023-06-16 ENCOUNTER — Ambulatory Visit: Payer: 59 | Admitting: Hematology

## 2023-06-16 ENCOUNTER — Inpatient Hospital Stay: Payer: 59 | Attending: Hematology

## 2023-06-16 ENCOUNTER — Inpatient Hospital Stay: Payer: 59

## 2023-06-16 VITALS — BP 136/84 | HR 92 | Temp 98.0°F | Resp 16 | Ht 69.0 in | Wt 156.0 lb

## 2023-06-16 DIAGNOSIS — Z79632 Long term (current) use of antitumor antibiotic: Secondary | ICD-10-CM | POA: Diagnosis not present

## 2023-06-16 DIAGNOSIS — Z79630 Long term (current) use of alkylating agent: Secondary | ICD-10-CM | POA: Diagnosis not present

## 2023-06-16 DIAGNOSIS — Z79620 Long term (current) use of immunosuppressive biologic: Secondary | ICD-10-CM | POA: Diagnosis not present

## 2023-06-16 DIAGNOSIS — C8330 Diffuse large B-cell lymphoma, unspecified site: Secondary | ICD-10-CM | POA: Diagnosis not present

## 2023-06-16 DIAGNOSIS — C8178 Other classical Hodgkin lymphoma, lymph nodes of multiple sites: Secondary | ICD-10-CM

## 2023-06-16 DIAGNOSIS — Z5112 Encounter for antineoplastic immunotherapy: Secondary | ICD-10-CM | POA: Insufficient documentation

## 2023-06-16 DIAGNOSIS — D509 Iron deficiency anemia, unspecified: Secondary | ICD-10-CM | POA: Diagnosis not present

## 2023-06-16 DIAGNOSIS — Z79633 Long term (current) use of mitotic inhibitor: Secondary | ICD-10-CM | POA: Insufficient documentation

## 2023-06-16 DIAGNOSIS — Z5111 Encounter for antineoplastic chemotherapy: Secondary | ICD-10-CM | POA: Insufficient documentation

## 2023-06-16 DIAGNOSIS — Z95828 Presence of other vascular implants and grafts: Secondary | ICD-10-CM

## 2023-06-16 LAB — CBC WITH DIFFERENTIAL (CANCER CENTER ONLY)
Abs Immature Granulocytes: 0.04 10*3/uL (ref 0.00–0.07)
Basophils Absolute: 0 10*3/uL (ref 0.0–0.1)
Basophils Relative: 1 %
Eosinophils Absolute: 0.1 10*3/uL (ref 0.0–0.5)
Eosinophils Relative: 1 %
HCT: 32.8 % — ABNORMAL LOW (ref 39.0–52.0)
Hemoglobin: 10.1 g/dL — ABNORMAL LOW (ref 13.0–17.0)
Immature Granulocytes: 1 %
Lymphocytes Relative: 23 %
Lymphs Abs: 1.4 10*3/uL (ref 0.7–4.0)
MCH: 24.3 pg — ABNORMAL LOW (ref 26.0–34.0)
MCHC: 30.8 g/dL (ref 30.0–36.0)
MCV: 79 fL — ABNORMAL LOW (ref 80.0–100.0)
Monocytes Absolute: 0.5 10*3/uL (ref 0.1–1.0)
Monocytes Relative: 8 %
Neutro Abs: 4.3 10*3/uL (ref 1.7–7.7)
Neutrophils Relative %: 66 %
Platelet Count: 260 10*3/uL (ref 150–400)
RBC: 4.15 MIL/uL — ABNORMAL LOW (ref 4.22–5.81)
RDW: 30 % — ABNORMAL HIGH (ref 11.5–15.5)
WBC Count: 6.4 10*3/uL (ref 4.0–10.5)
nRBC: 0 % (ref 0.0–0.2)

## 2023-06-16 LAB — CULTURE, BLOOD (ROUTINE X 2)
Culture: NO GROWTH
Culture: NO GROWTH
Special Requests: ADEQUATE

## 2023-06-16 LAB — CMP (CANCER CENTER ONLY)
ALT: 19 U/L (ref 0–44)
AST: 14 U/L — ABNORMAL LOW (ref 15–41)
Albumin: 4.1 g/dL (ref 3.5–5.0)
Alkaline Phosphatase: 83 U/L (ref 38–126)
Anion gap: 6 (ref 5–15)
BUN: 8 mg/dL (ref 8–23)
CO2: 28 mmol/L (ref 22–32)
Calcium: 9.3 mg/dL (ref 8.9–10.3)
Chloride: 104 mmol/L (ref 98–111)
Creatinine: 0.82 mg/dL (ref 0.61–1.24)
GFR, Estimated: >60 mL/min (ref >60)
Glucose, Bld: 126 mg/dL — ABNORMAL HIGH (ref 70–99)
Potassium: 3.6 mmol/L (ref 3.5–5.1)
Sodium: 138 mmol/L (ref 135–145)
Total Bilirubin: 0.2 mg/dL (ref 0.0–1.2)
Total Protein: 7.4 g/dL (ref 6.5–8.1)

## 2023-06-16 MED ORDER — SODIUM CHLORIDE 0.9% FLUSH
10.0000 mL | Freq: Once | INTRAVENOUS | Status: AC
  Administered 2023-06-16: 10 mL

## 2023-06-16 MED ORDER — ALTEPLASE 2 MG IJ SOLR: 2.0000 mg | Freq: Once | INTRAMUSCULAR | Status: DC | PRN

## 2023-06-16 MED ORDER — DOXORUBICIN HCL CHEMO IV INJECTION 2 MG/ML
25.0000 mg/m2 | Freq: Once | INTRAVENOUS | Status: AC
  Administered 2023-06-16: 46 mg via INTRAVENOUS
  Filled 2023-06-16: qty 23

## 2023-06-16 MED ORDER — ACETAMINOPHEN 325 MG PO TABS
650.0000 mg | ORAL_TABLET | Freq: Once | ORAL | Status: AC
  Administered 2023-06-16: 650 mg via ORAL
  Filled 2023-06-16: qty 2

## 2023-06-16 MED ORDER — SODIUM CHLORIDE 0.9 % IV SOLN
INTRAVENOUS | Status: DC
Start: 2023-06-16 — End: 2023-06-16

## 2023-06-16 MED ORDER — SODIUM CHLORIDE 0.9% FLUSH
10.0000 mL | INTRAVENOUS | Status: DC | PRN
  Administered 2023-06-16: 10 mL

## 2023-06-16 MED ORDER — HEPARIN SOD (PORK) LOCK FLUSH 100 UNIT/ML IV SOLN: 250.0000 [IU] | Freq: Once | INTRAVENOUS | Status: DC | PRN

## 2023-06-16 MED ORDER — SODIUM CHLORIDE 0.9 % IV SOLN
375.0000 mg/m2 | Freq: Once | INTRAVENOUS | Status: AC
  Administered 2023-06-16: 680 mg via INTRAVENOUS
  Filled 2023-06-16: qty 68

## 2023-06-16 MED ORDER — SODIUM CHLORIDE 0.9 % IV SOLN
1.2000 mg/kg | Freq: Once | INTRAVENOUS | Status: AC
  Administered 2023-06-16: 80 mg via INTRAVENOUS
  Filled 2023-06-16: qty 16

## 2023-06-16 MED ORDER — PALONOSETRON HCL INJECTION 0.25 MG/5ML
0.2500 mg | Freq: Once | INTRAVENOUS | Status: AC
  Administered 2023-06-16: 0.25 mg via INTRAVENOUS
  Filled 2023-06-16: qty 5

## 2023-06-16 MED ORDER — SODIUM CHLORIDE 0.9 % IV SOLN
6.0000 mg/m2 | Freq: Once | INTRAVENOUS | Status: AC
  Administered 2023-06-16: 10.9 mg via INTRAVENOUS
  Filled 2023-06-16: qty 10.9

## 2023-06-16 MED ORDER — SODIUM CHLORIDE 0.9 % IV SOLN
150.0000 mg | Freq: Once | INTRAVENOUS | Status: AC
  Administered 2023-06-16: 150 mg via INTRAVENOUS
  Filled 2023-06-16: qty 150

## 2023-06-16 MED ORDER — DIPHENHYDRAMINE HCL 50 MG/ML IJ SOLN
50.0000 mg | Freq: Once | INTRAMUSCULAR | Status: AC
  Administered 2023-06-16: 50 mg via INTRAVENOUS
  Filled 2023-06-16: qty 1

## 2023-06-16 MED ORDER — HEPARIN SOD (PORK) LOCK FLUSH 100 UNIT/ML IV SOLN
500.0000 [IU] | Freq: Once | INTRAVENOUS | Status: AC | PRN
  Administered 2023-06-16: 500 [IU]

## 2023-06-16 MED ORDER — SODIUM CHLORIDE 0.9% FLUSH: 3.0000 mL | INTRAVENOUS | Status: DC | PRN

## 2023-06-16 MED ORDER — DEXAMETHASONE SODIUM PHOSPHATE 10 MG/ML IJ SOLN
10.0000 mg | Freq: Once | INTRAMUSCULAR | Status: AC
  Administered 2023-06-16: 10 mg via INTRAVENOUS
  Filled 2023-06-16: qty 1

## 2023-06-16 NOTE — Patient Instructions (Signed)
[facility] - A DEPT OF [facility]  Discharge Instructions: Thank you for choosing [facility] to provide your oncology and hematology care.   If you have a lab appointment with the Cancer Center, please go directly to the Cancer Center and check in at the registration area.   Wear comfortable clothing and clothing appropriate for easy access to any Portacath or PICC line.   We strive to give you quality time with your provider. You may need to reschedule your appointment if you arrive late (15 or more minutes).  Arriving late affects you and other patients whose appointments are after yours.  Also, if you miss three or more appointments without notifying the office, you may be dismissed from the clinic at the provider's discretion.      For prescription refill requests, have your pharmacy contact our office and allow 72 hours for refills to be completed.    Today you received the following chemotherapy and/or immunotherapy agents: Doxorubicin, Vinblastine, Dacarbazine, Adcentris.       To help prevent nausea and vomiting after your treatment, we encourage you to take your nausea medication as directed.  BELOW ARE SYMPTOMS THAT SHOULD BE REPORTED IMMEDIATELY: *FEVER GREATER THAN 100.4 F (38 C) OR HIGHER *CHILLS OR SWEATING *NAUSEA AND VOMITING THAT IS NOT CONTROLLED WITH YOUR NAUSEA MEDICATION *UNUSUAL SHORTNESS OF BREATH *UNUSUAL BRUISING OR BLEEDING *URINARY PROBLEMS (pain or burning when urinating, or frequent urination) *BOWEL PROBLEMS (unusual diarrhea, constipation, pain near the anus) TENDERNESS IN MOUTH AND THROAT WITH OR WITHOUT PRESENCE OF ULCERS (sore throat, sores in mouth, or a toothache) UNUSUAL RASH, SWELLING OR PAIN  UNUSUAL VAGINAL DISCHARGE OR ITCHING   Items with * indicate a potential emergency and should be followed up as soon as possible or go to the Emergency Department if any problems should occur.  Please show the  CHEMOTHERAPY ALERT CARD or IMMUNOTHERAPY ALERT CARD at check-in to the Emergency Department and triage nurse.  Should you have questions after your visit or need to cancel or reschedule your appointment, please contact [facility] - [facility]  Dept: [phone]  and follow the prompts.  Office hours are 8:00 a.m. to 4:30 p.m. Monday - Friday. Please note that voicemails left after 4:00 p.m. may not be returned until the following business day.  We are closed weekends and major holidays. You have access to a nurse at all times for urgent questions. Please call the main number to the clinic Dept: [phone] and follow the prompts.   For any non-urgent questions, you may also contact your provider using MyChart. We now offer e-Visits for anyone 50 and older to request care online for non-urgent symptoms. For details visit [email or web address].   Also download the MyChart app! Go to the app store, search "MyChart", open the app, select [place], and log in with your MyChart username and password.

## 2023-06-18 ENCOUNTER — Inpatient Hospital Stay: Payer: 59

## 2023-06-18 VITALS — BP 133/86 | HR 99 | Temp 98.6°F

## 2023-06-18 DIAGNOSIS — C8330 Diffuse large B-cell lymphoma, unspecified site: Secondary | ICD-10-CM | POA: Diagnosis not present

## 2023-06-18 DIAGNOSIS — Z79620 Long term (current) use of immunosuppressive biologic: Secondary | ICD-10-CM | POA: Diagnosis not present

## 2023-06-18 DIAGNOSIS — Z79632 Long term (current) use of antitumor antibiotic: Secondary | ICD-10-CM | POA: Diagnosis not present

## 2023-06-18 DIAGNOSIS — C8178 Other classical Hodgkin lymphoma, lymph nodes of multiple sites: Secondary | ICD-10-CM

## 2023-06-18 DIAGNOSIS — Z5112 Encounter for antineoplastic immunotherapy: Secondary | ICD-10-CM | POA: Diagnosis not present

## 2023-06-18 DIAGNOSIS — Z79633 Long term (current) use of mitotic inhibitor: Secondary | ICD-10-CM | POA: Diagnosis not present

## 2023-06-18 DIAGNOSIS — Z79630 Long term (current) use of alkylating agent: Secondary | ICD-10-CM | POA: Diagnosis not present

## 2023-06-18 DIAGNOSIS — Z5111 Encounter for antineoplastic chemotherapy: Secondary | ICD-10-CM | POA: Diagnosis not present

## 2023-06-18 DIAGNOSIS — D509 Iron deficiency anemia, unspecified: Secondary | ICD-10-CM | POA: Diagnosis not present

## 2023-06-18 MED ORDER — PEGFILGRASTIM-JMDB 6 MG/0.6ML ~~LOC~~ SOSY
6.0000 mg | PREFILLED_SYRINGE | Freq: Once | SUBCUTANEOUS | Status: AC
  Administered 2023-06-18: 6 mg via SUBCUTANEOUS
  Filled 2023-06-18: qty 0.6

## 2023-06-27 MED FILL — Fosaprepitant Dimeglumine For IV Infusion 150 MG (Base Eq): INTRAVENOUS | Qty: 5 | Status: AC

## 2023-06-30 ENCOUNTER — Inpatient Hospital Stay: Payer: 59

## 2023-06-30 ENCOUNTER — Inpatient Hospital Stay (HOSPITAL_BASED_OUTPATIENT_CLINIC_OR_DEPARTMENT_OTHER): Payer: 59 | Admitting: Hematology

## 2023-06-30 ENCOUNTER — Other Ambulatory Visit: Payer: Self-pay

## 2023-06-30 VITALS — BP 127/80 | HR 92 | Temp 97.3°F | Resp 16 | Ht 69.0 in | Wt 153.1 lb

## 2023-06-30 DIAGNOSIS — Z95828 Presence of other vascular implants and grafts: Secondary | ICD-10-CM

## 2023-06-30 DIAGNOSIS — Z79630 Long term (current) use of alkylating agent: Secondary | ICD-10-CM | POA: Diagnosis not present

## 2023-06-30 DIAGNOSIS — D509 Iron deficiency anemia, unspecified: Secondary | ICD-10-CM

## 2023-06-30 DIAGNOSIS — Z79620 Long term (current) use of immunosuppressive biologic: Secondary | ICD-10-CM | POA: Diagnosis not present

## 2023-06-30 DIAGNOSIS — Z5111 Encounter for antineoplastic chemotherapy: Secondary | ICD-10-CM

## 2023-06-30 DIAGNOSIS — C8330 Diffuse large B-cell lymphoma, unspecified site: Secondary | ICD-10-CM | POA: Diagnosis not present

## 2023-06-30 DIAGNOSIS — C8178 Other classical Hodgkin lymphoma, lymph nodes of multiple sites: Secondary | ICD-10-CM

## 2023-06-30 DIAGNOSIS — Z79632 Long term (current) use of antitumor antibiotic: Secondary | ICD-10-CM | POA: Diagnosis not present

## 2023-06-30 DIAGNOSIS — Z79633 Long term (current) use of mitotic inhibitor: Secondary | ICD-10-CM | POA: Diagnosis not present

## 2023-06-30 DIAGNOSIS — Z5112 Encounter for antineoplastic immunotherapy: Secondary | ICD-10-CM | POA: Diagnosis not present

## 2023-06-30 LAB — URINALYSIS, COMPLETE (UACMP) WITH MICROSCOPIC
Bacteria, UA: NONE SEEN
Bilirubin Urine: NEGATIVE
Glucose, UA: NEGATIVE mg/dL
Ketones, ur: NEGATIVE mg/dL
Leukocytes,Ua: NEGATIVE
Nitrite: NEGATIVE
Protein, ur: NEGATIVE mg/dL
Specific Gravity, Urine: 1.013 (ref 1.005–1.030)
pH: 6 (ref 5.0–8.0)

## 2023-06-30 LAB — CBC WITH DIFFERENTIAL (CANCER CENTER ONLY)
Abs Immature Granulocytes: 0.11 10*3/uL — ABNORMAL HIGH (ref 0.00–0.07)
Basophils Absolute: 0.1 10*3/uL (ref 0.0–0.1)
Basophils Relative: 0 %
Eosinophils Absolute: 0.1 10*3/uL (ref 0.0–0.5)
Eosinophils Relative: 1 %
HCT: 28.8 % — ABNORMAL LOW (ref 39.0–52.0)
Hemoglobin: 9 g/dL — ABNORMAL LOW (ref 13.0–17.0)
Immature Granulocytes: 1 %
Lymphocytes Relative: 12 %
Lymphs Abs: 1.5 10*3/uL (ref 0.7–4.0)
MCH: 24.6 pg — ABNORMAL LOW (ref 26.0–34.0)
MCHC: 31.3 g/dL (ref 30.0–36.0)
MCV: 78.7 fL — ABNORMAL LOW (ref 80.0–100.0)
Monocytes Absolute: 0.5 10*3/uL (ref 0.1–1.0)
Monocytes Relative: 4 %
Neutro Abs: 10.2 10*3/uL — ABNORMAL HIGH (ref 1.7–7.7)
Neutrophils Relative %: 82 %
Platelet Count: 305 10*3/uL (ref 150–400)
RBC: 3.66 MIL/uL — ABNORMAL LOW (ref 4.22–5.81)
RDW: 28 % — ABNORMAL HIGH (ref 11.5–15.5)
WBC Count: 12.5 10*3/uL — ABNORMAL HIGH (ref 4.0–10.5)
nRBC: 0 % (ref 0.0–0.2)

## 2023-06-30 LAB — CMP (CANCER CENTER ONLY)
ALT: 42 U/L (ref 0–44)
AST: 23 U/L (ref 15–41)
Albumin: 3.8 g/dL (ref 3.5–5.0)
Alkaline Phosphatase: 76 U/L (ref 38–126)
Anion gap: 10 (ref 5–15)
BUN: 11 mg/dL (ref 8–23)
CO2: 24 mmol/L (ref 22–32)
Calcium: 9.2 mg/dL (ref 8.9–10.3)
Chloride: 101 mmol/L (ref 98–111)
Creatinine: 0.88 mg/dL (ref 0.61–1.24)
GFR, Estimated: >60 mL/min (ref >60)
Glucose, Bld: 152 mg/dL — ABNORMAL HIGH (ref 70–99)
Potassium: 3.5 mmol/L (ref 3.5–5.1)
Sodium: 135 mmol/L (ref 135–145)
Total Bilirubin: 0.2 mg/dL (ref 0.0–1.2)
Total Protein: 7.3 g/dL (ref 6.5–8.1)

## 2023-06-30 LAB — IRON AND IRON BINDING CAPACITY (CC-WL,HP ONLY)
Iron: 11 ug/dL — ABNORMAL LOW (ref 45–182)
Saturation Ratios: 5 % — ABNORMAL LOW (ref 17.9–39.5)
TIBC: 217 ug/dL — ABNORMAL LOW (ref 250–450)
UIBC: 206 ug/dL (ref 117–376)

## 2023-06-30 LAB — FERRITIN: Ferritin: 456 ng/mL — ABNORMAL HIGH (ref 24–336)

## 2023-06-30 MED ORDER — VINBLASTINE SULFATE CHEMO INJECTION 1 MG/ML
6.0000 mg/m2 | Freq: Once | INTRAVENOUS | Status: AC
  Administered 2023-06-30: 10.9 mg via INTRAVENOUS
  Filled 2023-06-30: qty 10.9

## 2023-06-30 MED ORDER — SODIUM CHLORIDE 0.9% FLUSH
10.0000 mL | Freq: Once | INTRAVENOUS | Status: AC
  Administered 2023-06-30: 10 mL

## 2023-06-30 MED ORDER — HEPARIN SOD (PORK) LOCK FLUSH 100 UNIT/ML IV SOLN
500.0000 [IU] | Freq: Once | INTRAVENOUS | Status: AC | PRN
  Administered 2023-06-30: 500 [IU]

## 2023-06-30 MED ORDER — DIPHENHYDRAMINE HCL 50 MG/ML IJ SOLN
50.0000 mg | Freq: Once | INTRAMUSCULAR | Status: AC
  Administered 2023-06-30: 50 mg via INTRAVENOUS
  Filled 2023-06-30: qty 1

## 2023-06-30 MED ORDER — FOSAPREPITANT DIMEGLUMINE INJECTION 150 MG
150.0000 mg | Freq: Once | INTRAVENOUS | Status: AC
  Administered 2023-06-30: 150 mg via INTRAVENOUS
  Filled 2023-06-30: qty 150

## 2023-06-30 MED ORDER — SODIUM CHLORIDE 0.9 % IV SOLN: INTRAVENOUS | Status: DC

## 2023-06-30 MED ORDER — ACETAMINOPHEN 325 MG PO TABS
650.0000 mg | ORAL_TABLET | Freq: Once | ORAL | Status: AC
  Administered 2023-06-30: 650 mg via ORAL
  Filled 2023-06-30: qty 2

## 2023-06-30 MED ORDER — MIRTAZAPINE 15 MG PO TABS: 15.0000 mg | ORAL_TABLET | Freq: Every day | ORAL | 1 refills | Status: DC

## 2023-06-30 MED ORDER — PALONOSETRON HCL INJECTION 0.25 MG/5ML
0.2500 mg | Freq: Once | INTRAVENOUS | Status: AC
  Administered 2023-06-30: 0.25 mg via INTRAVENOUS
  Filled 2023-06-30: qty 5

## 2023-06-30 MED ORDER — DOXORUBICIN HCL CHEMO IV INJECTION 2 MG/ML
25.0000 mg/m2 | Freq: Once | INTRAVENOUS | Status: AC
  Administered 2023-06-30: 46 mg via INTRAVENOUS
  Filled 2023-06-30: qty 23

## 2023-06-30 MED ORDER — SODIUM CHLORIDE 0.9% FLUSH
10.0000 mL | INTRAVENOUS | Status: DC | PRN
Start: 2023-06-30 — End: 2023-06-30
  Administered 2023-06-30: 10 mL

## 2023-06-30 MED ORDER — DEXAMETHASONE SODIUM PHOSPHATE 10 MG/ML IJ SOLN
10.0000 mg | Freq: Once | INTRAMUSCULAR | Status: AC
Start: 2023-06-30 — End: 2023-06-30
  Administered 2023-06-30: 10 mg via INTRAVENOUS
  Filled 2023-06-30: qty 1

## 2023-06-30 MED ORDER — SODIUM CHLORIDE 0.9 % IV SOLN
375.0000 mg/m2 | Freq: Once | INTRAVENOUS | Status: AC
  Administered 2023-06-30: 680 mg via INTRAVENOUS
  Filled 2023-06-30: qty 68

## 2023-06-30 MED ORDER — PANTOPRAZOLE SODIUM 40 MG PO TBEC: 40.0000 mg | DELAYED_RELEASE_TABLET | Freq: Two times a day (BID) | ORAL | 2 refills | Status: DC

## 2023-06-30 MED ORDER — SODIUM CHLORIDE 0.9 % IV SOLN
1.2000 mg/kg | Freq: Once | INTRAVENOUS | Status: AC
  Administered 2023-06-30: 80 mg via INTRAVENOUS
  Filled 2023-06-30: qty 16

## 2023-06-30 NOTE — Progress Notes (Signed)
 HEMATOLOGY/ONCOLOGY CLINIC NOTE  Date of Service: 06/30/23    Patient Care Team: Patient, No Pcp Per as PCP - General (General Practice)  CHIEF COMPLAINTS/PURPOSE OF CONSULTATION:  Follow-up for continued evaluation and mx of recently diagnosed Hodgkins lymphoma  HISTORY OF PRESENTING ILLNESS:   Timothy Hunter is a wonderful 63 y.o. male who has been referred to [place] by Dr. 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   Patient presented to the ED on 03/09/2023 for GI bleed/acute blood loss anemia/microcytic anemia/low vitamin B12 levels and was seen by Dr. Pamelia Hoit.   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   Today, he is accompanied by his wife. He reports that he was recently seen in the hospital for anemia, with abdominal pain and black stools. His hgb was in 6s at the time. Patient received a CT scan of the abdomen and chest and was found to have several enlarged lymph glands in the center of his chest, including lung nodules and findings in the upper abdomen, bone, and thoracic spine. A biopsy of lymph node in retroperitoneum did show concerns for lymphoma.  Patient reports that he has otherwise been generally been pretty healthy otherwise. He notes taking medication for arthritis and reports a medical history of glaucoma. He denies any other chronic medical issues and does not take any medications on a regular basis otherwise. Patient denies having any surgeries in the past.   He reports that his lower back was evaluated earlier this year due to lower back pain, and there were not any concerning findings at that time. He denies any pain in the middle back. His back pain has  improved recently. His wife reports that his energy level has improved recently.   Patient does not smoke cigarettes at this time. He previously smoked 2 packs a day since he was 63 years old until about 2010. He reports that he rarely consumes alcohol and only consumes alcohol with social use. Patient does smoke marijuana.  There is a fhx of cancer. His wife reports that he has had family members that have passed away from cancer, but she is unsure of the type of cancer they had. His wife reports that patient's younger and older sister do have a history of cancer, and are in remission at this time. His older sister has breast cancer and breast cancer does run in the family. Patient denies any other medical issues in the family. He denies any blood disorders in the family.   Patient reports that he has not endorsed any black stools since being home from the hospital, and denies any other changes in bowel habits.   Patient's wife reports that patient did receive a blood transfusion while in the hospital, as well as potassium, but does not believe that patient received IV iron. His B12 level was also low at that time. He is not on any iron or B12 replacement at this time.   He denies having any other symptoms at this time. Patient denies any lightheadedness/dizziness, SOB, chest pain, abdominal pain, or new leg swelling.  His wife reports that he did take one Tramadol for back pain prevention. He did not have any back pain prior to taking it.   He reports that he has lost  12 pounds over the course of about 3 months. Patient reports that his eating habits are normal at this time. He does drink two Ensure protein drinks a day and has gained 5-6 pounds recently. He reports that he was not put on any steroids recently.   Patient denies new lumps/bumps. His wife reports that patient has a scar on right forehead, which has been present for a while. He denies any swelling in the groin or testicular  pain/swelling.   INTERVAL HISTORY  Timothy Hunter is a 63 y.o. male here for continued evaluation and management of Hodgkins lymphoma and for toxicity check prior to cycle 3 day 1 of A-AVD chemotherapy.  Patient was last seen by me on 06/02/2023 and he complained of itchy skin rash around his right shoulder/back and around his left lower back.  He was presented to the ED on 06/11/2023 with abdominal pain, black stools, pain when urinating. He had an Chest X-Ray and Chest CT scan, which did not show any abnormalities. He was given IV antibiotic and IV fluid and was discharged.  Patient is accompanied by his wife during this visit. Patient notes he has been doing fairly well since our last visit. Patient notes he had one episode of black stool when he was hospitalized on 06/11/2023. During this visit, he complains of mild oliguria and frequent urination. He did not have Urine sample taken at the ED. He denies abdominal pain.   He also complains of mild back pain. He has been prescribed hydrocodone, which has been improving his pain.   Patient's wife notes that the patient has been having difficulties falling asleep.  He denies any new infection issues, fever, chills, night sweats, unexpected weight loss, chest pain, or leg swelling.    MEDICAL HISTORY:  Past Medical History:  Diagnosis Date   Dyspnea    Glaucoma associated with anomalies of iris     SURGICAL HISTORY: Past Surgical History:  Procedure Laterality Date   BIOPSY  03/11/2023   Procedure: BIOPSY;  Surgeon: Kerin Salen, MD;  Location: [facility];  Service: Gastroenterology;;   ESOPHAGOGASTRODUODENOSCOPY (EGD) WITH PROPOFOL N/A 03/11/2023   Procedure: ESOPHAGOGASTRODUODENOSCOPY (EGD) WITH PROPOFOL;  Surgeon: Kerin Salen, MD;  Location: [facility];  Service: Gastroenterology;  Laterality: N/A;   HOT HEMOSTASIS N/A 03/11/2023   Procedure: HOT HEMOSTASIS (ARGON PLASMA COAGULATION/BICAP);  Surgeon: Kerin Salen, MD;   Location: [facility];  Service: Gastroenterology;  Laterality: N/A;   IR IMAGING GUIDED PORT INSERTION  03/26/2023    SOCIAL HISTORY: Social History   Socioeconomic History   Marital status: Married    Spouse name: Not on file   Number of children: Not on file   Years of education: Not on file   Highest education level: Not on file  Occupational History   Not on file  Tobacco Use   Smoking status: Former    Passive exposure: Never   Smokeless tobacco: Not on file  Vaping Use   Vaping status: Never Used  Substance and Sexual Activity   Alcohol use: Yes    Comment: occasionally   Drug use: Yes    Types: Marijuana   Sexual activity: Not on file  Other Topics Concern   Not on file  Social History Narrative   Not on file   Social Drivers of Health   Financial Resource Strain: Not on file  Food Insecurity: No Food Insecurity (03/09/2023)   Hunger Vital Sign    Worried About Running Out of Food in the  Last Year: Never true    Ran Out of Food in the Last Year: Never true  Transportation Needs: No Transportation Needs (03/09/2023)   PRAPARE - Administrator, Civil Service (Medical): No    Lack of Transportation (Non-Medical): No  Physical Activity: Not on file  Stress: Not on file  Social Connections: Not on file  Intimate Partner Violence: Not At Risk (03/09/2023)   Humiliation, Afraid, Rape, and Kick questionnaire    Fear of Current or Ex-Partner: No    Emotionally Abused: No    Physically Abused: No    Sexually Abused: No    FAMILY HISTORY: No family history on file.  ALLERGIES:  has no known allergies.  MEDICATIONS:  Current Outpatient Medications  Medication Sig Dispense Refill   calcium-vitamin D (OSCAL WITH D) 500-5 MG-MCG tablet Take 1 tablet by mouth daily.     clotrimazole-betamethasone (LOTRISONE) cream Apply 1 Application topically 2 (two) times daily. To area of rash on shoulder and back. 30 g 1   Cyanocobalamin (VITAMIN B-12 PO) Take 1  tablet by mouth daily.     dexamethasone (DECADRON) 4 MG tablet Take 2 tablets by mouth once a day for 3 days after chemotherapy. Take with food. 30 tablet 1   feeding supplement (ENSURE ENLIVE / ENSURE PLUS) LIQD Take 237 mLs by mouth 2 (two) times daily between meals. 237 mL 12   HYDROcodone-acetaminophen (NORCO/VICODIN) 5-325 MG tablet Take 1 tablet by mouth every 6 (six) hours as needed for severe pain (pain score 7-10). 5 tablet 0   latanoprost (XALATAN) 0.005 % ophthalmic solution Place 1 drop into both eyes at bedtime.     lidocaine-prilocaine (EMLA) cream Apply to affected area once 30 g 3   ondansetron (ZOFRAN) 8 MG tablet Take 1 tablet (8 mg total) by mouth every 8 (eight) hours as needed for nausea or vomiting. Start on the third day after chemotherapy. 30 tablet 1   pantoprazole (PROTONIX) 40 MG tablet Take 1 tablet (40 mg total) by mouth daily before breakfast. 30 tablet 2   prochlorperazine (COMPAZINE) 10 MG tablet Take 1 tablet (10 mg total) by mouth every 6 (six) hours as needed for nausea or vomiting. 30 tablet 1   timolol (TIMOPTIC) 0.5 % ophthalmic solution Place 1 drop into both eyes in the morning.     traMADol (ULTRAM) 50 MG tablet Take 1 tablet (50 mg total) by mouth every 6 (six) hours as needed. 30 tablet 0   No current facility-administered medications for this visit.   Facility-Administered Medications Ordered in Other Visits  Medication Dose Route Frequency Provider Last Rate Last Admin   sodium chloride flush (NS) 0.9 % injection 10 mL  10 mL Intracatheter Once [name], MD        REVIEW OF SYSTEMS:   .10 Point review of Systems was done is negative except as noted above.  PHYSICAL EXAMINATION: ECOG PERFORMANCE STATUS: 2 - Symptomatic, <50% confined to bed .BP 127/80 (BP Location: Left Arm, Patient Position: Sitting)   Pulse 92   Temp (!) 97.3 F (36.3 C) (Temporal)   Resp 16   Ht 5\' 9"  (1.753 m)   Wt 153 lb 1.6 oz (69.4 kg)   SpO2 100%   BMI  22.61 kg/m   GENERAL:alert, in no acute distress and comfortable SKIN: no acute rashes, no significant lesions EYES: conjunctiva are pink and non-injected, sclera anicteric OROPHARYNX: MMM, no exudates, no oropharyngeal erythema or ulceration NECK: supple, no JVD LYMPH:  no palpable lymphadenopathy in the cervical, axillary or inguinal regions LUNGS: clear to auscultation b/l with normal respiratory effort HEART: regular rate & rhythm ABDOMEN:  normoactive bowel sounds , non tender, not distended. Extremity: no pedal edema PSYCH: alert & oriented x 3 with fluent speech NEURO: no focal motor/sensory deficits  LABORATORY DATA:  I have reviewed the data as listed  .    Latest Ref Rng & Units 06/30/2023    9:09 AM 06/16/2023    9:01 AM 06/11/2023    7:17 PM  CBC  WBC 4.0 - 10.5 K/uL 12.5  6.4  7.9   Hemoglobin 13.0 - 17.0 g/dL 9.0  09.8  11.9   Hematocrit 39.0 - 52.0 % 28.8  32.8  36.3   Platelets 150 - 400 K/uL 305  260  280     .    Latest Ref Rng & Units 06/30/2023    9:09 AM 06/16/2023    9:01 AM 06/11/2023    7:17 PM  CMP  Glucose 70 - 99 mg/dL 147  829  562   BUN 8 - 23 mg/dL 11  8  13    Creatinine 0.61 - 1.24 mg/dL 1.30  8.65  7.84   Sodium 135 - 145 mmol/L 135  138  137   Potassium 3.5 - 5.1 mmol/L 3.5  3.6  3.1   Chloride 98 - 111 mmol/L 101  104  103   CO2 22 - 32 mmol/L 24  28  20    Calcium 8.9 - 10.3 mg/dL 9.2  9.3  9.6   Total Protein 6.5 - 8.1 g/dL 7.3  7.4  8.2   Total Bilirubin 0.0 - 1.2 mg/dL 0.2  0.2  0.3   Alkaline Phos 38 - 126 U/L 76  83  73   AST 15 - 41 U/L 23  14  21    ALT 0 - 44 U/L 42  19  22    04/22/2023 right supraclavicular lymph node biopsy:         03/11/2023 biopsy of left retroperitoneal lymph node:     RADIOGRAPHIC STUDIES: I have personally reviewed the radiological images as listed and agreed with the findings in the report. CT Angio Abd/Pel W and/or Wo Contrast Result Date: 06/12/2023 CLINICAL DATA:  Abdominal pain.   Non-Hodgkin's lymphoma. EXAM: CTA ABDOMEN AND PELVIS WITHOUT AND WITH CONTRAST TECHNIQUE: Multidetector CT imaging of the abdomen and pelvis was performed using the standard protocol during bolus administration of intravenous contrast. Multiplanar reconstructed images and MIPs were obtained and reviewed to evaluate the vascular anatomy. RADIATION DOSE REDUCTION: This exam was performed according to the departmental dose-optimization program which includes automated exposure control, adjustment of the mA and/or kV according to patient size and/or use of iterative reconstruction technique. CONTRAST:  OMNIPAQUE IOHEXOL 350 MG/ML SOLN COMPARISON:  PET-CT 03/31/2023 FINDINGS: VASCULAR Aorta: Normal caliber aorta without aneurysm, dissection, vasculitis or significant stenosis. There is mild calcified atherosclerotic disease throughout the aorta. Celiac: Patent without evidence of aneurysm, dissection, vasculitis or significant stenosis. SMA: Patent without evidence of aneurysm, dissection, vasculitis or significant stenosis. Renals: Both renal arteries are patent without evidence of aneurysm, dissection, vasculitis, fibromuscular dysplasia or significant stenosis. IMA: Patent without evidence of aneurysm, dissection, vasculitis or significant stenosis. Inflow: Patent without evidence of aneurysm, dissection, vasculitis or significant stenosis. Proximal Outflow: Bilateral common femoral and visualized portions of the superficial and profunda femoral arteries are patent without evidence of aneurysm, dissection, vasculitis or significant stenosis. Veins: No obvious venous  abnormality within the limitations of this arterial phase study. Review of the MIP images confirms the above findings. NON-VASCULAR Lower chest: No acute abnormality. Hepatobiliary: No focal liver abnormality is seen. No gallstones, gallbladder wall thickening, or biliary dilatation. Pancreas: Unremarkable. No pancreatic ductal dilatation or  surrounding inflammatory changes. Spleen: Normal in size without focal abnormality. Adrenals/Urinary Tract: Adrenal glands are unremarkable. Kidneys are normal, without renal calculi, focal lesion, or hydronephrosis. Bladder is unremarkable. Stomach/Bowel: Stomach is within normal limits. Appendix appears normal. No evidence of bowel wall thickening, distention, or inflammatory changes. Lymphatic: There is a prominent left retrocrural lymph node measuring 9 mm, unchanged. Right retrocrural lymphadenopathy has decreased with lymph nodes now measuring up to 5 mm. Left para-aortic lymphadenopathy has decreased with largest lymph node now measuring 16 mm (previously 2.3 cm). No new enlarged lymph nodes are seen. Reproductive: Prostate is unremarkable. Other: No abdominal wall hernia or abnormality. No abdominopelvic ascites. Musculoskeletal: No acute fractures are seen. Lytic lesion in the T11 vertebral body appears grossly unchanged. There some adjacent right paravertebral soft tissue thickening which has decreased from prior. IMPRESSION: 1. No evidence for aortic dissection or aneurysm. 2. No acute localizing process in the abdomen or pelvis. 3. Decrease in retrocrural and retroperitoneal lymphadenopathy. 4. Stable lytic lesion in the T11 vertebral body. Aortic Atherosclerosis (ICD10-I70.0). NON-VASCULAR Electronically Signed   By: Darliss Cheney M.D.   On: 06/12/2023 00:05   DG Chest Port 1 View Result Date: 06/11/2023 CLINICAL DATA:  Insert epic diagnosis and indication EXAM: PORTABLE CHEST 1 VIEW COMPARISON:  Chest radiograph 03/09/2023.  PET CT 03/31/2023 FINDINGS: Right chest port with tip in the SVC. The cardiomediastinal contours are normal. The lungs are clear. Pulmonary vasculature is normal. No consolidation, pleural effusion, or pneumothorax. Known thoracic spine lesions are not well-defined on the current exam. IMPRESSION: No acute findings. Electronically Signed   By: Narda Rutherford M.D.   On:  06/11/2023 21:11     ASSESSMENT & PLAN:   63 y.o. 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 3. Hx of esophageal candidiasis 4.Back pain due to involvement by lymphoma -- pain now resolved. 5.  Protein calorie malnutrition-improving with lymphoma treatment has gained 7 pounds since his last visit . Wt Readings from Last 3 Encounters:  06/16/23 156 lb (70.8 kg)  06/11/23 160 lb (72.6 kg)  06/02/23 160 lb 14.4 oz (73 kg)    PLAN:  -Discussed lab results from today, 06/30/2023, in detail with the patient. CBC shows elevated WBC of 12.5 K, low hemoglobin of 9.0 g/dL with Hct of 40.9%. CMP stable. -Discussed the option of remeron medication to help with sleep. Patient agrees.  -Will prescribe Remeron -Increase Pantoprazole dose to twice daily, instead of once.  -Discussed the option of PET scan in 3 weeks, before cycle 4 of his treatment. Patient agrees.  -Schedule PET scan in 3 weeks.  -He has tolerated his second cycle of A-AVD chemotherapy and G-CSF without any significant acute treatment related toxicities.   -Proceed with cycle 3 day 1 of A-AVD chemotherapy with the same supportive medications. -Answered all of patient's and his wife's questions.  -Urine sample today to test for UTI. (UA with mild RBC , UCX neg for infection) -patient was counseled to monitor for black stools and call his GI Dr Marca Ancona or go to ED if recurrent concerns for GI bleeding. -will consider IV Iron as indicated  FOLLOW-UP: A-AVD  per integrated scheduling  PET/CT in 3 weeks MD visit in 2 weeks  The total time spent in the appointment was 32 minutes* .  All of the patient's questions were answered with apparent satisfaction. The patient knows to call the clinic with any problems, questions or concerns.   Wyvonnia Lora MD MS AAHIVMS [facility] Hematology/Oncology Physician [facility]  .*Total  Encounter Time as defined by the Centers for Medicare and Medicaid Services includes, in addition to the face-to-face time of a patient visit (documented in the note above) non-face-to-face time: obtaining and reviewing outside history, ordering and reviewing medications, tests or procedures, care coordination (communications with other health care professionals or caregivers) and documentation in the medical record.    I,Param Shah,acting as a Neurosurgeon for Wyvonnia Lora, MD.,have documented all relevant documentation on the behalf of Wyvonnia Lora, MD,as directed by  Wyvonnia Lora, MD while in the presence of Wyvonnia Lora, MD. .I have reviewed the above documentation for accuracy and completeness, and I agree with the above. [name] MD

## 2023-06-30 NOTE — Progress Notes (Signed)
Patient seen by Dr. Addison Naegeli are within treatment parameters.  Labs reviewed: and are within treatment parameters.  Per physician team, patient is ready for treatment and there are NO modifications to the treatment plan. / Pt to get U/A today

## 2023-06-30 NOTE — Patient Instructions (Addendum)
 [facility] - A DEPT OF [facility]  Discharge Instructions: Thank you for choosing [facility] to provide your oncology and hematology care.   If you have a lab appointment with the Cancer Center, please go directly to the Cancer Center and check in at the registration area.   Wear comfortable clothing and clothing appropriate for easy access to any Portacath or PICC line.   We strive to give you quality time with your provider. You may need to reschedule your appointment if you arrive late (15 or more minutes).  Arriving late affects you and other patients whose appointments are after yours.  Also, if you miss three or more appointments without notifying the office, you may be dismissed from the clinic at the provider's discretion.      For prescription refill requests, have your pharmacy contact our office and allow 72 hours for refills to be completed.    Today you received the following chemotherapy and/or immunotherapy agents: Doxorubicin, Vinblastine, DTIC, Adcetris      To help prevent nausea and vomiting after your treatment, we encourage you to take your nausea medication as directed.  BELOW ARE SYMPTOMS THAT SHOULD BE REPORTED IMMEDIATELY: *FEVER GREATER THAN 100.4 F (38 C) OR HIGHER *CHILLS OR SWEATING *NAUSEA AND VOMITING THAT IS NOT CONTROLLED WITH YOUR NAUSEA MEDICATION *UNUSUAL SHORTNESS OF BREATH *UNUSUAL BRUISING OR BLEEDING *URINARY PROBLEMS (pain or burning when urinating, or frequent urination) *BOWEL PROBLEMS (unusual diarrhea, constipation, pain near the anus) TENDERNESS IN MOUTH AND THROAT WITH OR WITHOUT PRESENCE OF ULCERS (sore throat, sores in mouth, or a toothache) UNUSUAL RASH, SWELLING OR PAIN  UNUSUAL VAGINAL DISCHARGE OR ITCHING   Items with * indicate a potential emergency and should be followed up as soon as possible or go to the Emergency Department if any problems should occur.  Please show the  CHEMOTHERAPY ALERT CARD or IMMUNOTHERAPY ALERT CARD at check-in to the Emergency Department and triage nurse.  Should you have questions after your visit or need to cancel or reschedule your appointment, please contact [facility] - A DEPT OF [facility]  Dept: [phone]  and follow the prompts.  Office hours are 8:00 a.m. to 4:30 p.m. Monday - Friday. Please note that voicemails left after 4:00 p.m. may not be returned until the following business day.  We are closed weekends and major holidays. You have access to a nurse at all times for urgent questions. Please call the main number to the clinic Dept: [phone] and follow the prompts.   For any non-urgent questions, you may also contact your provider using MyChart. We now offer e-Visits for anyone 14 and older to request care online for non-urgent symptoms. For details visit [email or web address].   Also download the MyChart app! Go to the app store, search "MyChart", open the app, select [place], and log in with your MyChart username and password.

## 2023-07-01 ENCOUNTER — Telehealth: Payer: Self-pay | Admitting: Hematology

## 2023-07-01 LAB — URINE CULTURE: Culture: NO GROWTH

## 2023-07-01 NOTE — Telephone Encounter (Signed)
 Rescheduled appointments per incoming weather. Left the patient a voicemail with the rescheduled appointment details. Patient is active on MyChart.

## 2023-07-02 ENCOUNTER — Ambulatory Visit: Payer: 59

## 2023-07-02 ENCOUNTER — Inpatient Hospital Stay: Payer: 59

## 2023-07-02 VITALS — BP 111/79 | HR 94 | Temp 98.3°F

## 2023-07-02 DIAGNOSIS — Z5112 Encounter for antineoplastic immunotherapy: Secondary | ICD-10-CM | POA: Diagnosis not present

## 2023-07-02 DIAGNOSIS — Z79620 Long term (current) use of immunosuppressive biologic: Secondary | ICD-10-CM | POA: Diagnosis not present

## 2023-07-02 DIAGNOSIS — Z5111 Encounter for antineoplastic chemotherapy: Secondary | ICD-10-CM | POA: Diagnosis not present

## 2023-07-02 DIAGNOSIS — C8330 Diffuse large B-cell lymphoma, unspecified site: Secondary | ICD-10-CM | POA: Diagnosis not present

## 2023-07-02 DIAGNOSIS — Z79632 Long term (current) use of antitumor antibiotic: Secondary | ICD-10-CM | POA: Diagnosis not present

## 2023-07-02 DIAGNOSIS — D509 Iron deficiency anemia, unspecified: Secondary | ICD-10-CM | POA: Diagnosis not present

## 2023-07-02 DIAGNOSIS — C8178 Other classical Hodgkin lymphoma, lymph nodes of multiple sites: Secondary | ICD-10-CM

## 2023-07-02 DIAGNOSIS — Z79630 Long term (current) use of alkylating agent: Secondary | ICD-10-CM | POA: Diagnosis not present

## 2023-07-02 DIAGNOSIS — Z79633 Long term (current) use of mitotic inhibitor: Secondary | ICD-10-CM | POA: Diagnosis not present

## 2023-07-02 MED ORDER — PEGFILGRASTIM-JMDB 6 MG/0.6ML ~~LOC~~ SOSY
6.0000 mg | PREFILLED_SYRINGE | Freq: Once | SUBCUTANEOUS | Status: AC
  Administered 2023-07-02: 6 mg via SUBCUTANEOUS
  Filled 2023-07-02: qty 0.6

## 2023-07-02 NOTE — Patient Instructions (Signed)

## 2023-07-06 ENCOUNTER — Encounter: Payer: Self-pay | Admitting: Hematology

## 2023-07-06 NOTE — Addendum Note (Signed)
 Addended by: Wyvonnia Lora on: 07/06/2023 10:38 PM   Modules accepted: Orders

## 2023-07-11 MED FILL — Fosaprepitant Dimeglumine For IV Infusion 150 MG (Base Eq): INTRAVENOUS | Qty: 5 | Status: AC

## 2023-07-14 ENCOUNTER — Other Ambulatory Visit: Payer: Self-pay

## 2023-07-14 ENCOUNTER — Inpatient Hospital Stay: Payer: 59 | Attending: Hematology

## 2023-07-14 ENCOUNTER — Other Ambulatory Visit: Payer: Self-pay | Admitting: Hematology

## 2023-07-14 ENCOUNTER — Inpatient Hospital Stay: Payer: 59

## 2023-07-14 VITALS — BP 106/64 | HR 86 | Temp 98.3°F | Resp 18 | Wt 151.1 lb

## 2023-07-14 DIAGNOSIS — Z87891 Personal history of nicotine dependence: Secondary | ICD-10-CM | POA: Diagnosis not present

## 2023-07-14 DIAGNOSIS — Z79633 Long term (current) use of mitotic inhibitor: Secondary | ICD-10-CM | POA: Insufficient documentation

## 2023-07-14 DIAGNOSIS — C8178 Other classical Hodgkin lymphoma, lymph nodes of multiple sites: Secondary | ICD-10-CM

## 2023-07-14 DIAGNOSIS — I2510 Atherosclerotic heart disease of native coronary artery without angina pectoris: Secondary | ICD-10-CM | POA: Insufficient documentation

## 2023-07-14 DIAGNOSIS — Z5111 Encounter for antineoplastic chemotherapy: Secondary | ICD-10-CM | POA: Insufficient documentation

## 2023-07-14 DIAGNOSIS — M545 Low back pain, unspecified: Secondary | ICD-10-CM | POA: Diagnosis not present

## 2023-07-14 DIAGNOSIS — Z79620 Long term (current) use of immunosuppressive biologic: Secondary | ICD-10-CM | POA: Insufficient documentation

## 2023-07-14 DIAGNOSIS — R634 Abnormal weight loss: Secondary | ICD-10-CM | POA: Insufficient documentation

## 2023-07-14 DIAGNOSIS — Z79632 Long term (current) use of antitumor antibiotic: Secondary | ICD-10-CM | POA: Diagnosis not present

## 2023-07-14 DIAGNOSIS — E46 Unspecified protein-calorie malnutrition: Secondary | ICD-10-CM | POA: Diagnosis not present

## 2023-07-14 DIAGNOSIS — R109 Unspecified abdominal pain: Secondary | ICD-10-CM | POA: Diagnosis not present

## 2023-07-14 DIAGNOSIS — Z803 Family history of malignant neoplasm of breast: Secondary | ICD-10-CM | POA: Diagnosis not present

## 2023-07-14 DIAGNOSIS — Z79899 Other long term (current) drug therapy: Secondary | ICD-10-CM | POA: Insufficient documentation

## 2023-07-14 DIAGNOSIS — C8198 Hodgkin lymphoma, unspecified, lymph nodes of multiple sites: Secondary | ICD-10-CM

## 2023-07-14 DIAGNOSIS — K31811 Angiodysplasia of stomach and duodenum with bleeding: Secondary | ICD-10-CM | POA: Insufficient documentation

## 2023-07-14 DIAGNOSIS — D62 Acute posthemorrhagic anemia: Secondary | ICD-10-CM | POA: Insufficient documentation

## 2023-07-14 DIAGNOSIS — C8330 Diffuse large B-cell lymphoma, unspecified site: Secondary | ICD-10-CM | POA: Diagnosis not present

## 2023-07-14 DIAGNOSIS — D509 Iron deficiency anemia, unspecified: Secondary | ICD-10-CM | POA: Diagnosis not present

## 2023-07-14 DIAGNOSIS — Z79630 Long term (current) use of alkylating agent: Secondary | ICD-10-CM | POA: Diagnosis not present

## 2023-07-14 DIAGNOSIS — Z5112 Encounter for antineoplastic immunotherapy: Secondary | ICD-10-CM | POA: Diagnosis not present

## 2023-07-14 DIAGNOSIS — R918 Other nonspecific abnormal finding of lung field: Secondary | ICD-10-CM | POA: Diagnosis not present

## 2023-07-14 DIAGNOSIS — Z7952 Long term (current) use of systemic steroids: Secondary | ICD-10-CM | POA: Diagnosis not present

## 2023-07-14 DIAGNOSIS — Z95828 Presence of other vascular implants and grafts: Secondary | ICD-10-CM

## 2023-07-14 LAB — CBC WITH DIFFERENTIAL (CANCER CENTER ONLY)
Abs Immature Granulocytes: 0.11 10*3/uL — ABNORMAL HIGH (ref 0.00–0.07)
Basophils Absolute: 0 10*3/uL (ref 0.0–0.1)
Basophils Relative: 0 %
Eosinophils Absolute: 0.1 10*3/uL (ref 0.0–0.5)
Eosinophils Relative: 1 %
HCT: 24.5 % — ABNORMAL LOW (ref 39.0–52.0)
Hemoglobin: 7.8 g/dL — ABNORMAL LOW (ref 13.0–17.0)
Immature Granulocytes: 1 %
Lymphocytes Relative: 8 %
Lymphs Abs: 1.1 10*3/uL (ref 0.7–4.0)
MCH: 24.8 pg — ABNORMAL LOW (ref 26.0–34.0)
MCHC: 31.8 g/dL (ref 30.0–36.0)
MCV: 78 fL — ABNORMAL LOW (ref 80.0–100.0)
Monocytes Absolute: 0.9 10*3/uL (ref 0.1–1.0)
Monocytes Relative: 7 %
Neutro Abs: 11.2 10*3/uL — ABNORMAL HIGH (ref 1.7–7.7)
Neutrophils Relative %: 83 %
Platelet Count: 435 10*3/uL — ABNORMAL HIGH (ref 150–400)
RBC: 3.14 MIL/uL — ABNORMAL LOW (ref 4.22–5.81)
RDW: 26.2 % — ABNORMAL HIGH (ref 11.5–15.5)
WBC Count: 13.4 10*3/uL — ABNORMAL HIGH (ref 4.0–10.5)
nRBC: 0 % (ref 0.0–0.2)

## 2023-07-14 LAB — CMP (CANCER CENTER ONLY)
ALT: 47 U/L — ABNORMAL HIGH (ref 0–44)
AST: 26 U/L (ref 15–41)
Albumin: 3.5 g/dL (ref 3.5–5.0)
Alkaline Phosphatase: 85 U/L (ref 38–126)
Anion gap: 7 (ref 5–15)
BUN: 8 mg/dL (ref 8–23)
CO2: 26 mmol/L (ref 22–32)
Calcium: 9.1 mg/dL (ref 8.9–10.3)
Chloride: 97 mmol/L — ABNORMAL LOW (ref 98–111)
Creatinine: 0.81 mg/dL (ref 0.61–1.24)
GFR, Estimated: >60 mL/min (ref >60)
Glucose, Bld: 124 mg/dL — ABNORMAL HIGH (ref 70–99)
Potassium: 3.8 mmol/L (ref 3.5–5.1)
Sodium: 130 mmol/L — ABNORMAL LOW (ref 135–145)
Total Bilirubin: 0.3 mg/dL (ref 0.0–1.2)
Total Protein: 7.1 g/dL (ref 6.5–8.1)

## 2023-07-14 LAB — PREPARE RBC (CROSSMATCH)

## 2023-07-14 LAB — SAMPLE TO BLOOD BANK

## 2023-07-14 MED ORDER — SODIUM CHLORIDE 0.9% FLUSH
10.0000 mL | Freq: Once | INTRAVENOUS | Status: AC
  Administered 2023-07-14: 10 mL

## 2023-07-14 MED ORDER — VINBLASTINE SULFATE CHEMO INJECTION 1 MG/ML
6.0000 mg/m2 | Freq: Once | INTRAVENOUS | Status: AC
  Administered 2023-07-14: 10.9 mg via INTRAVENOUS
  Filled 2023-07-14: qty 10.9

## 2023-07-14 MED ORDER — ACETAMINOPHEN 325 MG PO TABS
650.0000 mg | ORAL_TABLET | Freq: Once | ORAL | Status: AC
  Administered 2023-07-14: 650 mg via ORAL
  Filled 2023-07-14: qty 2

## 2023-07-14 MED ORDER — HEPARIN SOD (PORK) LOCK FLUSH 100 UNIT/ML IV SOLN: 500.0000 [IU] | Freq: Once | INTRAVENOUS | Status: DC | PRN

## 2023-07-14 MED ORDER — DEXAMETHASONE SODIUM PHOSPHATE 10 MG/ML IJ SOLN
10.0000 mg | Freq: Once | INTRAMUSCULAR | Status: AC
  Administered 2023-07-14: 10 mg via INTRAVENOUS
  Filled 2023-07-14: qty 1

## 2023-07-14 MED ORDER — SODIUM CHLORIDE 0.9 % IV SOLN
1.2000 mg/kg | Freq: Once | INTRAVENOUS | Status: AC
  Administered 2023-07-14: 80 mg via INTRAVENOUS
  Filled 2023-07-14: qty 16

## 2023-07-14 MED ORDER — SODIUM CHLORIDE 0.9 % IV SOLN: INTRAVENOUS | Status: DC

## 2023-07-14 MED ORDER — SODIUM CHLORIDE 0.9 % IV SOLN
375.0000 mg/m2 | Freq: Once | INTRAVENOUS | Status: AC
  Administered 2023-07-14: 680 mg via INTRAVENOUS
  Filled 2023-07-14: qty 68

## 2023-07-14 MED ORDER — DOXORUBICIN HCL CHEMO IV INJECTION 2 MG/ML
25.0000 mg/m2 | Freq: Once | INTRAVENOUS | Status: AC
  Administered 2023-07-14: 46 mg via INTRAVENOUS
  Filled 2023-07-14: qty 23

## 2023-07-14 MED ORDER — PALONOSETRON HCL INJECTION 0.25 MG/5ML
0.2500 mg | Freq: Once | INTRAVENOUS | Status: AC
  Administered 2023-07-14: 0.25 mg via INTRAVENOUS
  Filled 2023-07-14: qty 5

## 2023-07-14 MED ORDER — DIPHENHYDRAMINE HCL 50 MG/ML IJ SOLN
50.0000 mg | Freq: Once | INTRAMUSCULAR | Status: AC
  Administered 2023-07-14: 50 mg via INTRAVENOUS
  Filled 2023-07-14: qty 1

## 2023-07-14 MED ORDER — SODIUM CHLORIDE 0.9 % IV SOLN
150.0000 mg | Freq: Once | INTRAVENOUS | Status: AC
  Administered 2023-07-14: 150 mg via INTRAVENOUS
  Filled 2023-07-14: qty 150

## 2023-07-14 MED ORDER — SODIUM CHLORIDE 0.9% FLUSH
10.0000 mL | INTRAVENOUS | Status: DC | PRN
  Administered 2023-07-14: 10 mL

## 2023-07-14 NOTE — Patient Instructions (Signed)
 [facility] - A DEPT OF [facility]  Discharge Instructions: Thank you for choosing [facility] to provide your oncology and hematology care.   If you have a lab appointment with the Cancer Center, please go directly to the Cancer Center and check in at the registration area.   Wear comfortable clothing and clothing appropriate for easy access to any Portacath or PICC line.   We strive to give you quality time with your provider. You may need to reschedule your appointment if you arrive late (15 or more minutes).  Arriving late affects you and other patients whose appointments are after yours.  Also, if you miss three or more appointments without notifying the office, you may be dismissed from the clinic at the provider's discretion.      For prescription refill requests, have your pharmacy contact our office and allow 72 hours for refills to be completed.    Today you received the following chemotherapy and/or immunotherapy agents: Doxorubicin, Vinblastine, DTIC, Adcetris      To help prevent nausea and vomiting after your treatment, we encourage you to take your nausea medication as directed.  BELOW ARE SYMPTOMS THAT SHOULD BE REPORTED IMMEDIATELY: *FEVER GREATER THAN 100.4 F (38 C) OR HIGHER *CHILLS OR SWEATING *NAUSEA AND VOMITING THAT IS NOT CONTROLLED WITH YOUR NAUSEA MEDICATION *UNUSUAL SHORTNESS OF BREATH *UNUSUAL BRUISING OR BLEEDING *URINARY PROBLEMS (pain or burning when urinating, or frequent urination) *BOWEL PROBLEMS (unusual diarrhea, constipation, pain near the anus) TENDERNESS IN MOUTH AND THROAT WITH OR WITHOUT PRESENCE OF ULCERS (sore throat, sores in mouth, or a toothache) UNUSUAL RASH, SWELLING OR PAIN  UNUSUAL VAGINAL DISCHARGE OR ITCHING   Items with * indicate a potential emergency and should be followed up as soon as possible or go to the Emergency Department if any problems should occur.  Please show the  CHEMOTHERAPY ALERT CARD or IMMUNOTHERAPY ALERT CARD at check-in to the Emergency Department and triage nurse.  Should you have questions after your visit or need to cancel or reschedule your appointment, please contact [facility] - A DEPT OF [facility]  Dept: [phone]  and follow the prompts.  Office hours are 8:00 a.m. to 4:30 p.m. Monday - Friday. Please note that voicemails left after 4:00 p.m. may not be returned until the following business day.  We are closed weekends and major holidays. You have access to a nurse at all times for urgent questions. Please call the main number to the clinic Dept: [phone] and follow the prompts.   For any non-urgent questions, you may also contact your provider using MyChart. We now offer e-Visits for anyone 14 and older to request care online for non-urgent symptoms. For details visit [email or web address].   Also download the MyChart app! Go to the app store, search "MyChart", open the app, select [place], and log in with your MyChart username and password.

## 2023-07-16 ENCOUNTER — Ambulatory Visit: Payer: 59

## 2023-07-16 ENCOUNTER — Inpatient Hospital Stay

## 2023-07-16 VITALS — BP 122/77 | HR 82 | Temp 98.2°F | Resp 16 | Ht 69.0 in | Wt 151.9 lb

## 2023-07-16 DIAGNOSIS — R918 Other nonspecific abnormal finding of lung field: Secondary | ICD-10-CM | POA: Diagnosis not present

## 2023-07-16 DIAGNOSIS — Z79632 Long term (current) use of antitumor antibiotic: Secondary | ICD-10-CM | POA: Diagnosis not present

## 2023-07-16 DIAGNOSIS — C8178 Other classical Hodgkin lymphoma, lymph nodes of multiple sites: Secondary | ICD-10-CM

## 2023-07-16 DIAGNOSIS — Z5112 Encounter for antineoplastic immunotherapy: Secondary | ICD-10-CM | POA: Diagnosis not present

## 2023-07-16 DIAGNOSIS — M545 Low back pain, unspecified: Secondary | ICD-10-CM | POA: Diagnosis not present

## 2023-07-16 DIAGNOSIS — K31811 Angiodysplasia of stomach and duodenum with bleeding: Secondary | ICD-10-CM | POA: Diagnosis not present

## 2023-07-16 DIAGNOSIS — Z79630 Long term (current) use of alkylating agent: Secondary | ICD-10-CM | POA: Diagnosis not present

## 2023-07-16 DIAGNOSIS — I2510 Atherosclerotic heart disease of native coronary artery without angina pectoris: Secondary | ICD-10-CM | POA: Diagnosis not present

## 2023-07-16 DIAGNOSIS — C8198 Hodgkin lymphoma, unspecified, lymph nodes of multiple sites: Secondary | ICD-10-CM

## 2023-07-16 DIAGNOSIS — D509 Iron deficiency anemia, unspecified: Secondary | ICD-10-CM | POA: Diagnosis not present

## 2023-07-16 DIAGNOSIS — Z7952 Long term (current) use of systemic steroids: Secondary | ICD-10-CM | POA: Diagnosis not present

## 2023-07-16 DIAGNOSIS — Z79633 Long term (current) use of mitotic inhibitor: Secondary | ICD-10-CM | POA: Diagnosis not present

## 2023-07-16 DIAGNOSIS — Z5111 Encounter for antineoplastic chemotherapy: Secondary | ICD-10-CM | POA: Diagnosis not present

## 2023-07-16 DIAGNOSIS — Z87891 Personal history of nicotine dependence: Secondary | ICD-10-CM | POA: Diagnosis not present

## 2023-07-16 DIAGNOSIS — R109 Unspecified abdominal pain: Secondary | ICD-10-CM | POA: Diagnosis not present

## 2023-07-16 DIAGNOSIS — Z79620 Long term (current) use of immunosuppressive biologic: Secondary | ICD-10-CM | POA: Diagnosis not present

## 2023-07-16 DIAGNOSIS — Z79899 Other long term (current) drug therapy: Secondary | ICD-10-CM | POA: Diagnosis not present

## 2023-07-16 DIAGNOSIS — C8330 Diffuse large B-cell lymphoma, unspecified site: Secondary | ICD-10-CM | POA: Diagnosis not present

## 2023-07-16 DIAGNOSIS — E46 Unspecified protein-calorie malnutrition: Secondary | ICD-10-CM | POA: Diagnosis not present

## 2023-07-16 DIAGNOSIS — Z803 Family history of malignant neoplasm of breast: Secondary | ICD-10-CM | POA: Diagnosis not present

## 2023-07-16 DIAGNOSIS — D62 Acute posthemorrhagic anemia: Secondary | ICD-10-CM | POA: Diagnosis not present

## 2023-07-16 DIAGNOSIS — R634 Abnormal weight loss: Secondary | ICD-10-CM | POA: Diagnosis not present

## 2023-07-16 MED ORDER — ACETAMINOPHEN 325 MG PO TABS
650.0000 mg | ORAL_TABLET | Freq: Once | ORAL | Status: AC
  Administered 2023-07-16: 650 mg via ORAL
  Filled 2023-07-16: qty 2

## 2023-07-16 MED ORDER — SODIUM CHLORIDE 0.9% FLUSH
10.0000 mL | Freq: Once | INTRAVENOUS | Status: AC
  Administered 2023-07-16: 10 mL via INTRAVENOUS

## 2023-07-16 MED ORDER — METHYLPREDNISOLONE SODIUM SUCC 40 MG IJ SOLR
40.0000 mg | Freq: Once | INTRAMUSCULAR | Status: AC
  Administered 2023-07-16: 40 mg via INTRAVENOUS
  Filled 2023-07-16: qty 1

## 2023-07-16 MED ORDER — HEPARIN SOD (PORK) LOCK FLUSH 100 UNIT/ML IV SOLN
500.0000 [IU] | Freq: Once | INTRAVENOUS | Status: AC
  Administered 2023-07-16: 500 [IU] via INTRAVENOUS

## 2023-07-16 MED ORDER — PEGFILGRASTIM-JMDB 6 MG/0.6ML ~~LOC~~ SOSY
6.0000 mg | PREFILLED_SYRINGE | Freq: Once | SUBCUTANEOUS | Status: AC
  Administered 2023-07-16: 6 mg via SUBCUTANEOUS
  Filled 2023-07-16: qty 0.6

## 2023-07-16 MED ORDER — SODIUM CHLORIDE 0.9% IV SOLUTION
250.0000 mL | INTRAVENOUS | Status: DC
  Administered 2023-07-16: 100 mL via INTRAVENOUS

## 2023-07-16 NOTE — Patient Instructions (Addendum)
 Blood Transfusion, Adult, Care After After a blood transfusion, it is common to have: Bruising and soreness at the IV site. A headache. Follow these instructions at home: Your doctor may give you more instructions. If you have problems, contact your doctor. Insertion site care     Follow instructions from your doctor about how to take care of your insertion site. This is where an IV tube was put into your vein. Make sure you: Wash your hands with soap and water for at least 20 seconds before and after you change your bandage. If you cannot use soap and water, use hand sanitizer. Change your bandage as told by your doctor. Check your insertion site every day for signs of infection. Check for: Redness, swelling, or pain. Bleeding from the site. Warmth. Pus or a bad smell. General instructions Take over-the-counter and prescription medicines only as told by your doctor. Rest as told by your doctor. Go back to your normal activities as told by your doctor. Keep all follow-up visits. You may need to have tests at certain times to check your blood. Contact a doctor if: You have itching or red, swollen areas of skin (hives). You have a fever or chills. You have pain in the head, back, or chest. You feel worried or nervous (anxious). You feel weak after doing your normal activities. You have any of these problems at the insertion site: Redness, swelling, warmth, or pain. Bleeding that does not stop with pressure. Pus or a bad smell. If you received your blood transfusion in an outpatient setting, you will be told whom to contact to report any reactions. Get help right away if: You have signs of a serious reaction. This may be coming from an allergy or the body's defense system (immune system). Signs include: Trouble breathing or shortness of breath. Swelling of the face or feeling warm (flushed). A widespread rash. Dark pee (urine) or blood in the pee. Fast heartbeat. These symptoms  may be an emergency. Get help right away. Call 911. Do not wait to see if the symptoms will go away. Do not drive yourself to the hospital. Summary Bruising and soreness at the IV site are common. Check your insertion site every day for signs of infection. Rest as told by your doctor. Go back to your normal activities as told by your doctor. Get help right away if you have signs of a serious reaction. This information is not intended to replace advice given to you by your health care provider. Make sure you discuss any questions you have with your health care provider. Pegfilgrastim Injection What is this medication? PEGFILGRASTIM (PEG fil gra stim) lowers the risk of infection in people who are receiving chemotherapy. It works by Systems analyst make more white blood cells, which protects your body from infection. It may also be used to help people who have been exposed to high doses of radiation. This medicine may be used for other purposes; ask your health care provider or pharmacist if you have questions. COMMON BRAND NAME(S): Cherly Hensen, Neulasta, Nyvepria, Stimufend, UDENYCA, UDENYCA ONBODY, Ziextenzo What should I tell my care team before I take this medication? They need to know if you have any of these conditions: Kidney disease Latex allergy Ongoing radiation therapy Sickle cell disease Skin reactions to acrylic adhesives (On-Body Injector only) An unusual or allergic reaction to pegfilgrastim, filgrastim, other medications, foods, dyes, or preservatives Pregnant or trying to get pregnant Breast-feeding How should I use this medication? This medication is  for injection under the skin. If you get this medication at home, you will be taught how to prepare and give the pre-filled syringe or how to use the On-body Injector. Refer to the patient Instructions for Use for detailed instructions. Use exactly as directed. Tell your care team immediately if you suspect that the  On-body Injector may not have performed as intended or if you suspect the use of the On-body Injector resulted in a missed or partial dose. It is important that you put your used needles and syringes in a special sharps container. Do not put them in a trash can. If you do not have a sharps container, call your pharmacist or care team to get one. Talk to your care team about the use of this medication in children. While this medication may be prescribed for selected conditions, precautions do apply. Overdosage: If you think you have taken too much of this medicine contact a poison control center or emergency room at once. NOTE: This medicine is only for you. Do not share this medicine with others. What if I miss a dose? It is important not to miss your dose. Call your care team if you miss your dose. If you miss a dose due to an On-body Injector failure or leakage, a new dose should be administered as soon as possible using a single prefilled syringe for manual use. What may interact with this medication? Interactions have not been studied. This list may not describe all possible interactions. Give your health care provider a list of all the medicines, herbs, non-prescription drugs, or dietary supplements you use. Also tell them if you smoke, drink alcohol, or use illegal drugs. Some items may interact with your medicine. What should I watch for while using this medication? Your condition will be monitored carefully while you are receiving this medication. You may need blood work done while you are taking this medication. Talk to your care team about your risk of cancer. You may be more at risk for certain types of cancer if you take this medication. If you are going to need a MRI, CT scan, or other procedure, tell your care team that you are using this medication (On-Body Injector only). What side effects may I notice from receiving this medication? Side effects that you should report to your care  team as soon as possible: Allergic reactions--skin rash, itching, hives, swelling of the face, lips, tongue, or throat Capillary leak syndrome--stomach or muscle pain, unusual weakness or fatigue, feeling faint or lightheaded, decrease in the amount of urine, swelling of the ankles, hands, or feet, trouble breathing High white blood cell level--fever, fatigue, trouble breathing, night sweats, change in vision, weight loss Inflammation of the aorta--fever, fatigue, back, chest, or stomach pain, severe headache Kidney injury (glomerulonephritis)--decrease in the amount of urine, red or dark brown urine, foamy or bubbly urine, swelling of the ankles, hands, or feet Shortness of breath or trouble breathing Spleen injury--pain in upper left stomach or shoulder Unusual bruising or bleeding Side effects that usually do not require medical attention (report to your care team if they continue or are bothersome): Bone pain Pain in the hands or feet This list may not describe all possible side effects. Call your doctor for medical advice about side effects. You may report side effects to FDA at 1-800-FDA-1088. Where should I keep my medication? Keep out of the reach of children. If you are using this medication at home, you will be instructed on how to store it.  Throw away any unused medication after the expiration date on the label. NOTE: This sheet is a summary. It may not cover all possible information. If you have questions about this medicine, talk to your doctor, pharmacist, or health care provider.  2024 Elsevier/Gold Standard (2021-03-30 00:00:00) Document Revised: 07/27/2021 Document Reviewed: 07/27/2021 Elsevier Patient Education  2024 [employer].

## 2023-07-17 LAB — TYPE AND SCREEN
ABO/RH(D): O POS
Antibody Screen: NEGATIVE
Unit division: 0

## 2023-07-17 LAB — BPAM RBC
Blood Product Expiration Date: 202503282359
ISSUE DATE / TIME: 202503051207
Unit Type and Rh: 202503282359
Unit Type and Rh: 5100

## 2023-07-21 ENCOUNTER — Ambulatory Visit (HOSPITAL_COMMUNITY)
Admission: RE | Admit: 2023-07-21 | Discharge: 2023-07-21 | Disposition: A | Discharge status: Home / Self Care | Payer: 59 | Source: Ambulatory Visit | Attending: Hematology | Admitting: Hematology

## 2023-07-21 DIAGNOSIS — C8190 Hodgkin lymphoma, unspecified, unspecified site: Secondary | ICD-10-CM | POA: Diagnosis not present

## 2023-07-21 DIAGNOSIS — C8178 Other classical Hodgkin lymphoma, lymph nodes of multiple sites: Secondary | ICD-10-CM | POA: Insufficient documentation

## 2023-07-21 LAB — GLUCOSE, CAPILLARY: Glucose-Capillary: 107 mg/dL — ABNORMAL HIGH (ref 70–99)

## 2023-07-21 MED ORDER — FLUDEOXYGLUCOSE F - 18 (FDG) INJECTION
7.4500 | Freq: Once | INTRAVENOUS | Status: AC | PRN
  Administered 2023-07-21: 7.45 via INTRAVENOUS

## 2023-07-25 MED FILL — Fosaprepitant Dimeglumine For IV Infusion 150 MG (Base Eq): INTRAVENOUS | Qty: 5 | Status: AC

## 2023-07-28 ENCOUNTER — Inpatient Hospital Stay (HOSPITAL_BASED_OUTPATIENT_CLINIC_OR_DEPARTMENT_OTHER): Payer: 59 | Admitting: Hematology

## 2023-07-28 ENCOUNTER — Other Ambulatory Visit: Payer: Self-pay

## 2023-07-28 ENCOUNTER — Inpatient Hospital Stay: Payer: 59

## 2023-07-28 ENCOUNTER — Inpatient Hospital Stay

## 2023-07-28 VITALS — BP 122/74 | HR 109 | Temp 98.1°F | Resp 15 | Wt 148.1 lb

## 2023-07-28 VITALS — HR 89

## 2023-07-28 DIAGNOSIS — Z79620 Long term (current) use of immunosuppressive biologic: Secondary | ICD-10-CM | POA: Diagnosis not present

## 2023-07-28 DIAGNOSIS — M545 Low back pain, unspecified: Secondary | ICD-10-CM | POA: Diagnosis not present

## 2023-07-28 DIAGNOSIS — C8330 Diffuse large B-cell lymphoma, unspecified site: Secondary | ICD-10-CM | POA: Diagnosis not present

## 2023-07-28 DIAGNOSIS — Z87891 Personal history of nicotine dependence: Secondary | ICD-10-CM | POA: Diagnosis not present

## 2023-07-28 DIAGNOSIS — Z5111 Encounter for antineoplastic chemotherapy: Secondary | ICD-10-CM | POA: Diagnosis not present

## 2023-07-28 DIAGNOSIS — R109 Unspecified abdominal pain: Secondary | ICD-10-CM | POA: Diagnosis not present

## 2023-07-28 DIAGNOSIS — Z95828 Presence of other vascular implants and grafts: Secondary | ICD-10-CM

## 2023-07-28 DIAGNOSIS — D509 Iron deficiency anemia, unspecified: Secondary | ICD-10-CM

## 2023-07-28 DIAGNOSIS — Z79632 Long term (current) use of antitumor antibiotic: Secondary | ICD-10-CM | POA: Diagnosis not present

## 2023-07-28 DIAGNOSIS — Z79630 Long term (current) use of alkylating agent: Secondary | ICD-10-CM | POA: Diagnosis not present

## 2023-07-28 DIAGNOSIS — C8178 Other classical Hodgkin lymphoma, lymph nodes of multiple sites: Secondary | ICD-10-CM

## 2023-07-28 DIAGNOSIS — R634 Abnormal weight loss: Secondary | ICD-10-CM | POA: Diagnosis not present

## 2023-07-28 DIAGNOSIS — I2510 Atherosclerotic heart disease of native coronary artery without angina pectoris: Secondary | ICD-10-CM | POA: Diagnosis not present

## 2023-07-28 DIAGNOSIS — Z803 Family history of malignant neoplasm of breast: Secondary | ICD-10-CM | POA: Diagnosis not present

## 2023-07-28 DIAGNOSIS — E46 Unspecified protein-calorie malnutrition: Secondary | ICD-10-CM | POA: Diagnosis not present

## 2023-07-28 DIAGNOSIS — Z79633 Long term (current) use of mitotic inhibitor: Secondary | ICD-10-CM | POA: Diagnosis not present

## 2023-07-28 DIAGNOSIS — Z7952 Long term (current) use of systemic steroids: Secondary | ICD-10-CM | POA: Diagnosis not present

## 2023-07-28 DIAGNOSIS — D62 Acute posthemorrhagic anemia: Secondary | ICD-10-CM | POA: Diagnosis not present

## 2023-07-28 DIAGNOSIS — R918 Other nonspecific abnormal finding of lung field: Secondary | ICD-10-CM | POA: Diagnosis not present

## 2023-07-28 DIAGNOSIS — K31811 Angiodysplasia of stomach and duodenum with bleeding: Secondary | ICD-10-CM | POA: Diagnosis not present

## 2023-07-28 DIAGNOSIS — Z5112 Encounter for antineoplastic immunotherapy: Secondary | ICD-10-CM | POA: Diagnosis not present

## 2023-07-28 DIAGNOSIS — Z79899 Other long term (current) drug therapy: Secondary | ICD-10-CM | POA: Diagnosis not present

## 2023-07-28 LAB — CMP (CANCER CENTER ONLY)
ALT: 64 U/L — ABNORMAL HIGH (ref 0–44)
AST: 32 U/L (ref 15–41)
Albumin: 3.5 g/dL (ref 3.5–5.0)
Alkaline Phosphatase: 111 U/L (ref 38–126)
Anion gap: 9 (ref 5–15)
BUN: 9 mg/dL (ref 8–23)
CO2: 26 mmol/L (ref 22–32)
Calcium: 8.8 mg/dL — ABNORMAL LOW (ref 8.9–10.3)
Chloride: 97 mmol/L — ABNORMAL LOW (ref 98–111)
Creatinine: 0.86 mg/dL (ref 0.61–1.24)
GFR, Estimated: >60 mL/min (ref >60)
Glucose, Bld: 125 mg/dL — ABNORMAL HIGH (ref 70–99)
Potassium: 3.5 mmol/L (ref 3.5–5.1)
Sodium: 132 mmol/L — ABNORMAL LOW (ref 135–145)
Total Bilirubin: 0.4 mg/dL (ref 0.0–1.2)
Total Protein: 7.3 g/dL (ref 6.5–8.1)

## 2023-07-28 LAB — CBC WITH DIFFERENTIAL (CANCER CENTER ONLY)
Abs Immature Granulocytes: 0.14 10*3/uL — ABNORMAL HIGH (ref 0.00–0.07)
Basophils Absolute: 0.1 10*3/uL (ref 0.0–0.1)
Basophils Relative: 1 %
Eosinophils Absolute: 0.2 10*3/uL (ref 0.0–0.5)
Eosinophils Relative: 2 %
HCT: 27.5 % — ABNORMAL LOW (ref 39.0–52.0)
Hemoglobin: 8.9 g/dL — ABNORMAL LOW (ref 13.0–17.0)
Immature Granulocytes: 1 %
Lymphocytes Relative: 11 %
Lymphs Abs: 1.5 10*3/uL (ref 0.7–4.0)
MCH: 25.6 pg — ABNORMAL LOW (ref 26.0–34.0)
MCHC: 32.4 g/dL (ref 30.0–36.0)
MCV: 79 fL — ABNORMAL LOW (ref 80.0–100.0)
Monocytes Absolute: 1 10*3/uL (ref 0.1–1.0)
Monocytes Relative: 7 %
Neutro Abs: 11.2 10*3/uL — ABNORMAL HIGH (ref 1.7–7.7)
Neutrophils Relative %: 78 %
Platelet Count: 485 10*3/uL — ABNORMAL HIGH (ref 150–400)
RBC: 3.48 MIL/uL — ABNORMAL LOW (ref 4.22–5.81)
RDW: 23.5 % — ABNORMAL HIGH (ref 11.5–15.5)
WBC Count: 14.1 10*3/uL — ABNORMAL HIGH (ref 4.0–10.5)
nRBC: 0 % (ref 0.0–0.2)

## 2023-07-28 LAB — URINALYSIS, COMPLETE (UACMP) WITH MICROSCOPIC
Bacteria, UA: NONE SEEN
Bilirubin Urine: NEGATIVE
Glucose, UA: NEGATIVE mg/dL
Ketones, ur: NEGATIVE mg/dL
Leukocytes,Ua: NEGATIVE
Nitrite: NEGATIVE
Protein, ur: 30 mg/dL — AB
Specific Gravity, Urine: 1.024 (ref 1.005–1.030)
pH: 5 (ref 5.0–8.0)

## 2023-07-28 MED ORDER — SODIUM CHLORIDE 0.9% FLUSH
10.0000 mL | Freq: Once | INTRAVENOUS | Status: AC
  Administered 2023-07-28: 10 mL

## 2023-07-28 MED ORDER — SODIUM CHLORIDE 0.9 % IV SOLN: INTRAVENOUS | Status: DC

## 2023-07-28 MED ORDER — SODIUM CHLORIDE 0.9 % IV SOLN
375.0000 mg/m2 | Freq: Once | INTRAVENOUS | Status: AC
  Administered 2023-07-28: 680 mg via INTRAVENOUS
  Filled 2023-07-28: qty 68

## 2023-07-28 MED ORDER — DEXAMETHASONE SODIUM PHOSPHATE 10 MG/ML IJ SOLN
10.0000 mg | Freq: Once | INTRAMUSCULAR | Status: AC
  Administered 2023-07-28: 10 mg via INTRAVENOUS
  Filled 2023-07-28: qty 1

## 2023-07-28 MED ORDER — DIPHENHYDRAMINE HCL 50 MG/ML IJ SOLN
50.0000 mg | Freq: Once | INTRAMUSCULAR | Status: AC
  Administered 2023-07-28: 50 mg via INTRAVENOUS
  Filled 2023-07-28: qty 1

## 2023-07-28 MED ORDER — SODIUM CHLORIDE 0.9 % IV SOLN
1.2000 mg/kg | Freq: Once | INTRAVENOUS | Status: AC
  Administered 2023-07-28: 80 mg via INTRAVENOUS
  Filled 2023-07-28: qty 16

## 2023-07-28 MED ORDER — PALONOSETRON HCL INJECTION 0.25 MG/5ML
0.2500 mg | Freq: Once | INTRAVENOUS | Status: AC
  Administered 2023-07-28: 0.25 mg via INTRAVENOUS
  Filled 2023-07-28: qty 5

## 2023-07-28 MED ORDER — SODIUM CHLORIDE 0.9 % IV SOLN
150.0000 mg | Freq: Once | INTRAVENOUS | Status: AC
  Administered 2023-07-28: 150 mg via INTRAVENOUS
  Filled 2023-07-28: qty 150

## 2023-07-28 MED ORDER — ACETAMINOPHEN 325 MG PO TABS
650.0000 mg | ORAL_TABLET | Freq: Once | ORAL | Status: AC
  Administered 2023-07-28: 650 mg via ORAL
  Filled 2023-07-28: qty 2

## 2023-07-28 MED ORDER — VINBLASTINE SULFATE CHEMO INJECTION 1 MG/ML
6.0000 mg/m2 | Freq: Once | INTRAVENOUS | Status: AC
  Administered 2023-07-28: 10.9 mg via INTRAVENOUS
  Filled 2023-07-28: qty 10.9

## 2023-07-28 MED ORDER — DOXORUBICIN HCL CHEMO IV INJECTION 2 MG/ML
25.0000 mg/m2 | Freq: Once | INTRAVENOUS | Status: AC
Start: 2023-07-28 — End: 2023-07-28
  Administered 2023-07-28: 46 mg via INTRAVENOUS
  Filled 2023-07-28: qty 23

## 2023-07-28 NOTE — Patient Instructions (Signed)
 [facility] - A DEPT OF [facility]  Discharge Instructions: Thank you for choosing [facility] to provide your oncology and hematology care.   If you have a lab appointment with the Cancer Center, please go directly to the Cancer Center and check in at the registration area.   Wear comfortable clothing and clothing appropriate for easy access to any Portacath or PICC line.   We strive to give you quality time with your provider. You may need to reschedule your appointment if you arrive late (15 or more minutes).  Arriving late affects you and other patients whose appointments are after yours.  Also, if you miss three or more appointments without notifying the office, you may be dismissed from the clinic at the provider's discretion.      For prescription refill requests, have your pharmacy contact our office and allow 72 hours for refills to be completed.    Today you received the following chemotherapy and/or immunotherapy agents: Doxorubicin, Vinblastine, DTIC, Adcetris      To help prevent nausea and vomiting after your treatment, we encourage you to take your nausea medication as directed.  BELOW ARE SYMPTOMS THAT SHOULD BE REPORTED IMMEDIATELY: *FEVER GREATER THAN 100.4 F (38 C) OR HIGHER *CHILLS OR SWEATING *NAUSEA AND VOMITING THAT IS NOT CONTROLLED WITH YOUR NAUSEA MEDICATION *UNUSUAL SHORTNESS OF BREATH *UNUSUAL BRUISING OR BLEEDING *URINARY PROBLEMS (pain or burning when urinating, or frequent urination) *BOWEL PROBLEMS (unusual diarrhea, constipation, pain near the anus) TENDERNESS IN MOUTH AND THROAT WITH OR WITHOUT PRESENCE OF ULCERS (sore throat, sores in mouth, or a toothache) UNUSUAL RASH, SWELLING OR PAIN  UNUSUAL VAGINAL DISCHARGE OR ITCHING   Items with * indicate a potential emergency and should be followed up as soon as possible or go to the Emergency Department if any problems should occur.  Please show the  CHEMOTHERAPY ALERT CARD or IMMUNOTHERAPY ALERT CARD at check-in to the Emergency Department and triage nurse.  Should you have questions after your visit or need to cancel or reschedule your appointment, please contact [facility] - A DEPT OF [facility]  Dept: [phone]  and follow the prompts.  Office hours are 8:00 a.m. to 4:30 p.m. Monday - Friday. Please note that voicemails left after 4:00 p.m. may not be returned until the following business day.  We are closed weekends and major holidays. You have access to a nurse at all times for urgent questions. Please call the main number to the clinic Dept: [phone] and follow the prompts.   For any non-urgent questions, you may also contact your provider using MyChart. We now offer e-Visits for anyone 14 and older to request care online for non-urgent symptoms. For details visit [email or web address].   Also download the MyChart app! Go to the app store, search "MyChart", open the app, select [place], and log in with your MyChart username and password.

## 2023-07-28 NOTE — Progress Notes (Signed)
 Nutrition Assessment   Reason for Assessment:  Patient identified on Malnutrition Screening report for weight loss   ASSESSMENT:  63 year old male with stage IV, hodgkin lymphoma (gray zone lymphoma).  Past medical history of GI bleed, iron deficiency anemia.  Patient receiving doxorubicin, brentuximab vedotin, darcarbazine, vinblastine  Met with patient during infusion. Reports that appetite is up and down.  Says that wife is encouraging him to eat and preparing meals/snacks.  Drinks equate shakes 2 a day.  Breakfast is usually 2 eggs, 2-3 strips bacon and grits.  Snacks during the day on fruit, shakes.  Usually has dinner around 5pm (meat and couple of sides).      Medications: compazine, remeron, protonix, zofran, vit b 12, dexamethasone   Labs: Na 132, glucose 125, calcium 8.8, Hgb 8.9   Anthropometrics:   Height: 69 inches Weight: 148 lb 1.6 oz today 160 lb on 1/29 BMI: 22  7% weight loss in the last 1 1/2 months   Estimated Energy Needs  Kcals: 1675-2000 Protein: 83-100 g Fluid: 1675-2000 ml   NUTRITION DIAGNOSIS: Inadequate oral intake related to cancer and related treatment side effects as evidenced by 7% weight loss in the last 1 1/2 months and decreased intake   INTERVENTION:  Discussed ways to add calories and protein in diet.   Recommend 350 calorie shake or higher 2-3 times a day.  Examples written for patient.  Coupons for ensure provided Encouraged small frequent mini meals/snacks Contact information provided   MONITORING, EVALUATION, GOAL: weight trends, intake   Next Visit: Monday, March 31 during infusion  Cordella Nyquist B. Elease Hashimoto, CSO, LDN Registered Dietitian [phone]

## 2023-07-28 NOTE — Progress Notes (Signed)
 Patient seen by Dr. Addison Naegeli are within treatment parameters.  Labs reviewed: and are within treatment parameters.  Per physician team, patient is ready for treatment and there are NO modifications to the treatment plan.

## 2023-07-28 NOTE — Progress Notes (Signed)
 HEMATOLOGY/ONCOLOGY CLINIC NOTE  Date of Service: 07/28/23    Patient Care Team: Patient, No Pcp Per as PCP - General (General Practice)  CHIEF COMPLAINTS/PURPOSE OF CONSULTATION:  Follow-up for continued evaluation and mx of recently diagnosed Hodgkins lymphoma  HISTORY OF PRESENTING ILLNESS:   Timothy Hunter is a wonderful 63 y.o. male who has been referred to [place] by Dr. 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   Patient presented to the ED on 03/09/2023 for GI bleed/acute blood loss anemia/microcytic anemia/low vitamin B12 levels and was seen by Dr. Pamelia Hoit.   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   Today, he is accompanied by his wife. He reports that he was recently seen in the hospital for anemia, with abdominal pain and black stools. His hgb was in 6s at the time. Patient received a CT scan of the abdomen and chest and was found to have several enlarged lymph glands in the center of his chest, including lung nodules and findings in the upper abdomen, bone, and thoracic spine. A biopsy of lymph node in retroperitoneum did show concerns for lymphoma.  Patient reports that he has otherwise been generally been pretty healthy otherwise. He notes taking medication for arthritis and reports a medical history of glaucoma. He denies any other chronic medical issues and does not take any medications on a regular basis otherwise. Patient denies having any surgeries in the past.   He reports that his lower back was evaluated earlier this year due to lower back pain, and there were not any concerning findings at that time. He denies any pain in the middle back. His back pain has  improved recently. His wife reports that his energy level has improved recently.   Patient does not smoke cigarettes at this time. He previously smoked 2 packs a day since he was 63 years old until about 2010. He reports that he rarely consumes alcohol and only consumes alcohol with social use. Patient does smoke marijuana.  There is a fhx of cancer. His wife reports that he has had family members that have passed away from cancer, but she is unsure of the type of cancer they had. His wife reports that patient's younger and older sister do have a history of cancer, and are in remission at this time. His older sister has breast cancer and breast cancer does run in the family. Patient denies any other medical issues in the family. He denies any blood disorders in the family.   Patient reports that he has not endorsed any black stools since being home from the hospital, and denies any other changes in bowel habits.   Patient's wife reports that patient did receive a blood transfusion while in the hospital, as well as potassium, but does not believe that patient received IV iron. His B12 level was also low at that time. He is not on any iron or B12 replacement at this time.   He denies having any other symptoms at this time. Patient denies any lightheadedness/dizziness, SOB, chest pain, abdominal pain, or new leg swelling.  His wife reports that he did take one Tramadol for back pain prevention. He did not have any back pain prior to taking it.   He reports that he has lost  12 pounds over the course of about 3 months. Patient reports that his eating habits are normal at this time. He does drink two Ensure protein drinks a day and has gained 5-6 pounds recently. He reports that he was not put on any steroids recently.   Patient denies new lumps/bumps. His wife reports that patient has a scar on right forehead, which has been present for a while. He denies any swelling in the groin or testicular  pain/swelling.   INTERVAL HISTORY  Timothy Hunter is a 63 y.o. male here for continued evaluation and management of Hodgkins lymphoma. He is here to receive cycle 4 day 1 of A-AVD chemotherapy.  Patient was last seen by me on 06/30/2023 and he complained of one episode of black stool, mild oliguria, frequent urination, mild back pain, and mild insomnia.   Patient is accompanied by his wife during this visit. Patient notes he has been doing well overall since our last visit. He complains of abdominal pain, nail discoloration, mild taste aversion, and mild occasional night sweats. He occasionally takes Tramadol that controls his abdominal pain.   He denies any new infection issues, fever, chills, unexpected weight loss, back pain, chest pain, black stool, abnormal bleeding, hematuria, oliguria, or leg swelling. He did lose 3-lbs since 07/16/2023.   Patient tolerated his third cycle of his treatment well without any new or severe toxicities.   Patient has starting taking oral iron supplement once daily.  MEDICAL HISTORY:  Past Medical History:  Diagnosis Date   Dyspnea    Glaucoma associated with anomalies of iris     SURGICAL HISTORY: Past Surgical History:  Procedure Laterality Date   BIOPSY  03/11/2023   Procedure: BIOPSY;  Surgeon: Kerin Salen, MD;  Location: [facility];  Service: Gastroenterology;;   ESOPHAGOGASTRODUODENOSCOPY (EGD) WITH PROPOFOL N/A 03/11/2023   Procedure: ESOPHAGOGASTRODUODENOSCOPY (EGD) WITH PROPOFOL;  Surgeon: Kerin Salen, MD;  Location: [facility];  Service: Gastroenterology;  Laterality: N/A;   HOT HEMOSTASIS N/A 03/11/2023   Procedure: HOT HEMOSTASIS (ARGON PLASMA COAGULATION/BICAP);  Surgeon: Kerin Salen, MD;  Location: [facility];  Service: Gastroenterology;  Laterality: N/A;   IR IMAGING GUIDED PORT INSERTION  03/26/2023    SOCIAL HISTORY: Social History   Socioeconomic History   Marital status: Married    Spouse name: Not on file    Number of children: Not on file   Years of education: Not on file   Highest education level: Not on file  Occupational History   Not on file  Tobacco Use   Smoking status: Former    Passive exposure: Never   Smokeless tobacco: Not on file  Vaping Use   Vaping status: Never Used  Substance and Sexual Activity   Alcohol use: Yes    Comment: occasionally   Drug use: Yes    Types: Marijuana   Sexual activity: Not on file  Other Topics Concern   Not on file  Social History Narrative   Not on file   Social Drivers of Health   Financial Resource Strain: Not on file  Food Insecurity: No Food Insecurity (03/09/2023)   Hunger Vital Sign    Worried About Running Out of Food in the Last Year: Never true    Ran Out of Food in the Last Year: Never true  Transportation Needs: No Transportation Needs (03/09/2023)   PRAPARE - Administrator, Civil Service (Medical): No    Lack of Transportation (Non-Medical): No  Physical Activity: Not on file  Stress: Not on file  Social Connections: Not on file  Intimate Partner Violence: Not At Risk (03/09/2023)   Humiliation, Afraid, Rape, and Kick questionnaire    Fear of Current or Ex-Partner: No    Emotionally Abused: No    Physically Abused: No    Sexually Abused: No    FAMILY HISTORY: No family history on file.  ALLERGIES:  has no known allergies.  MEDICATIONS:  Current Outpatient Medications  Medication Sig Dispense Refill   calcium-vitamin D (OSCAL WITH D) 500-5 MG-MCG tablet Take 1 tablet by mouth daily.     clotrimazole-betamethasone (LOTRISONE) cream Apply 1 Application topically 2 (two) times daily. To area of rash on shoulder and back. 30 g 1   Cyanocobalamin (VITAMIN B-12 PO) Take 1 tablet by mouth daily.     dexamethasone (DECADRON) 4 MG tablet Take 2 tablets by mouth once a day for 3 days after chemotherapy. Take with food. 30 tablet 1   feeding supplement (ENSURE ENLIVE / ENSURE PLUS) LIQD Take 237 mLs by mouth 2  (two) times daily between meals. 237 mL 12   HYDROcodone-acetaminophen (NORCO/VICODIN) 5-325 MG tablet Take 1 tablet by mouth every 6 (six) hours as needed for severe pain (pain score 7-10). (Patient not taking: Reported on 06/30/2023) 5 tablet 0   latanoprost (XALATAN) 0.005 % ophthalmic solution Place 1 drop into both eyes at bedtime.     lidocaine-prilocaine (EMLA) cream Apply to affected area once 30 g 3   mirtazapine (REMERON) 15 MG tablet Take 1 tablet (15 mg total) by mouth at bedtime. 30 tablet 1   ondansetron (ZOFRAN) 8 MG tablet Take 1 tablet (8 mg total) by mouth every 8 (eight) hours as needed for nausea or vomiting. Start on the third day after chemotherapy. 30 tablet 1   pantoprazole (PROTONIX) 40 MG tablet Take 1 tablet (40 mg total) by mouth 2 (two) times daily before a meal. 60 tablet 2   prochlorperazine (COMPAZINE) 10 MG tablet Take 1 tablet (10 mg total) by mouth every 6 (six) hours as needed for nausea or vomiting. 30 tablet 1   timolol (TIMOPTIC) 0.5 % ophthalmic solution Place 1 drop into both eyes in the morning.     traMADol (ULTRAM) 50 MG tablet Take 1 tablet (50 mg total) by mouth every 6 (six) hours as needed. 30 tablet 0   No current facility-administered medications for this visit.    REVIEW OF SYSTEMS:   .10 Point review of Systems was done is negative except as noted above.  PHYSICAL EXAMINATION: ECOG PERFORMANCE STATUS: 2 - Symptomatic, <50% confined to bed .BP 122/74 (BP Location: Left Arm, Patient Position: Sitting)   Pulse (!) 109   Temp 98.1 F (36.7 C) (Temporal)   Resp 15   Wt 148 lb 1.6 oz (67.2 kg)   SpO2 100%   BMI 21.87 kg/m   GENERAL:alert, in no acute distress and comfortable SKIN: no acute rashes, no significant lesions EYES: conjunctiva are pink and non-injected, sclera anicteric OROPHARYNX: MMM, no exudates, no oropharyngeal erythema or ulceration NECK: supple, no JVD LYMPH:  no palpable lymphadenopathy in the cervical, axillary or  inguinal regions LUNGS: clear to auscultation b/l with normal respiratory effort HEART: regular rate & rhythm ABDOMEN:  normoactive bowel sounds , non tender, not distended. Extremity: no pedal edema PSYCH: alert & oriented x 3 with fluent speech NEURO: no focal motor/sensory deficits  LABORATORY DATA:  I have reviewed the data as listed  .    Latest Ref  Rng & Units 07/28/2023   11:15 AM 07/14/2023    9:21 AM 06/30/2023    9:09 AM  CBC  WBC 4.0 - 10.5 K/uL 14.1  13.4  12.5   Hemoglobin 13.0 - 17.0 g/dL 8.9  7.8  9.0   Hematocrit 39.0 - 52.0 % 27.5  24.5  28.8   Platelets 150 - 400 K/uL 485  435  305     .    Latest Ref Rng & Units 07/28/2023   11:15 AM 07/14/2023    9:21 AM 06/30/2023    9:09 AM  CMP  Glucose 70 - 99 mg/dL 308  657  846   BUN 8 - 23 mg/dL 9  8  11    Creatinine 0.61 - 1.24 mg/dL 9.62  9.52  8.41   Sodium 135 - 145 mmol/L 132  130  135   Potassium 3.5 - 5.1 mmol/L 3.5  3.8  3.5   Chloride 98 - 111 mmol/L 97  97  101   CO2 22 - 32 mmol/L 26  26  24    Calcium 8.9 - 10.3 mg/dL 8.8  9.1  9.2   Total Protein 6.5 - 8.1 g/dL 7.3  7.1  7.3   Total Bilirubin 0.0 - 1.2 mg/dL 0.4  0.3  0.2   Alkaline Phos 38 - 126 U/L 111  85  76   AST 15 - 41 U/L 32  26  23   ALT 0 - 44 U/L 64  47  42    04/22/2023 right supraclavicular lymph node biopsy:         03/11/2023 biopsy of left retroperitoneal lymph node:     RADIOGRAPHIC STUDIES: I have personally reviewed the radiological images as listed and agreed with the findings in the report. No results found.    ASSESSMENT & PLAN:   63 y.o. 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 3. Hx of esophageal candidiasis 4.Back pain due to involvement by lymphoma -- pain now resolved. 5.  Protein calorie malnutrition-improving with lymphoma treatment has gained 7 pounds since his last visit . Wt Readings from Last 3 Encounters:   07/16/23 151 lb 14.4 oz (68.9 kg)  07/14/23 151 lb 0.8 oz (68.5 kg)  06/30/23 153 lb 1.6 oz (69.4 kg)    PLAN: -Discussed lab results from today, 07/28/2023, in detail with the patient. CBC shows elevated WBC of 14.1 K, slightly low Hgb of 8.9 g/dL with Hct of 32.4 %, and elevated platelets of 485 K.  Cmp  stable except ALT of 64 -PET scan results pending from 07/21/2023.  -He has tolerated his third cycle of A-AVD chemotherapy and G-CSF without any significant acute treatment related toxicities.   -Proceed with cycle 4 day 1 of A-AVD chemotherapy with the same supportive medications. -Answered all of patient's and his wife's questions.  -Discussed with the patient that night sweats are most likely related to his growth-factor shot.  -Discussed the option of IV Iron infusion and holding oral iron medication. Pt agrees.  -Schedule pt for IV Iron infusion.    FOLLOW-UP: Venofer 300mg  weekly x 3 doses PLz schedule remaining C4,C5 and C6 per integrated scheduling with portflush and labs  The total time spent in the appointment was 32 minutes* .  All of the patient's questions were answered with apparent satisfaction. The patient knows to call the clinic with any problems, questions or concerns.  Wyvonnia Lora MD MS AAHIVMS [facility] Hematology/Oncology Physician [facility]  .*Total Encounter Time as defined by the Centers for Medicare and Medicaid Services includes, in addition to the face-to-face time of a patient visit (documented in the note above) non-face-to-face time: obtaining and reviewing outside history, ordering and reviewing medications, tests or procedures, care coordination (communications with other health care professionals or caregivers) and documentation in the medical record.  I,Param Shah,acting as a Neurosurgeon for Wyvonnia Lora, MD.,have documented all relevant documentation on the behalf of Wyvonnia Lora, MD,as directed by  Wyvonnia Lora, MD while in the presence  of Wyvonnia Lora, MD.  .I have reviewed the above documentation for accuracy and completeness, and I agree with the above. [name] MD    ADDENDUM  PET/CT 07/21/2023 : IMPRESSION: 1. Complete response to therapy with no residual abnormal hypermetabolism in the neck, chest, abdomen or pelvis ([place] 1). 2. Advanced left anterior descending coronary artery calcification.     Electronically Signed   By: Leanna Battles M.D.   On: 07/28/2023 12:34

## 2023-07-30 ENCOUNTER — Inpatient Hospital Stay: Payer: 59

## 2023-07-30 VITALS — BP 112/71 | HR 89 | Temp 97.8°F | Resp 17

## 2023-07-30 DIAGNOSIS — E46 Unspecified protein-calorie malnutrition: Secondary | ICD-10-CM | POA: Diagnosis not present

## 2023-07-30 DIAGNOSIS — Z79632 Long term (current) use of antitumor antibiotic: Secondary | ICD-10-CM | POA: Diagnosis not present

## 2023-07-30 DIAGNOSIS — M545 Low back pain, unspecified: Secondary | ICD-10-CM | POA: Diagnosis not present

## 2023-07-30 DIAGNOSIS — R109 Unspecified abdominal pain: Secondary | ICD-10-CM | POA: Diagnosis not present

## 2023-07-30 DIAGNOSIS — Z803 Family history of malignant neoplasm of breast: Secondary | ICD-10-CM | POA: Diagnosis not present

## 2023-07-30 DIAGNOSIS — R918 Other nonspecific abnormal finding of lung field: Secondary | ICD-10-CM | POA: Diagnosis not present

## 2023-07-30 DIAGNOSIS — C8178 Other classical Hodgkin lymphoma, lymph nodes of multiple sites: Secondary | ICD-10-CM

## 2023-07-30 DIAGNOSIS — Z7952 Long term (current) use of systemic steroids: Secondary | ICD-10-CM | POA: Diagnosis not present

## 2023-07-30 DIAGNOSIS — I2510 Atherosclerotic heart disease of native coronary artery without angina pectoris: Secondary | ICD-10-CM | POA: Diagnosis not present

## 2023-07-30 DIAGNOSIS — Z79630 Long term (current) use of alkylating agent: Secondary | ICD-10-CM | POA: Diagnosis not present

## 2023-07-30 DIAGNOSIS — C8330 Diffuse large B-cell lymphoma, unspecified site: Secondary | ICD-10-CM | POA: Diagnosis not present

## 2023-07-30 DIAGNOSIS — D509 Iron deficiency anemia, unspecified: Secondary | ICD-10-CM | POA: Diagnosis not present

## 2023-07-30 DIAGNOSIS — Z79620 Long term (current) use of immunosuppressive biologic: Secondary | ICD-10-CM | POA: Diagnosis not present

## 2023-07-30 DIAGNOSIS — D62 Acute posthemorrhagic anemia: Secondary | ICD-10-CM | POA: Diagnosis not present

## 2023-07-30 DIAGNOSIS — Z5112 Encounter for antineoplastic immunotherapy: Secondary | ICD-10-CM | POA: Diagnosis not present

## 2023-07-30 DIAGNOSIS — R634 Abnormal weight loss: Secondary | ICD-10-CM | POA: Diagnosis not present

## 2023-07-30 DIAGNOSIS — Z79899 Other long term (current) drug therapy: Secondary | ICD-10-CM | POA: Diagnosis not present

## 2023-07-30 DIAGNOSIS — K31811 Angiodysplasia of stomach and duodenum with bleeding: Secondary | ICD-10-CM | POA: Diagnosis not present

## 2023-07-30 DIAGNOSIS — Z79633 Long term (current) use of mitotic inhibitor: Secondary | ICD-10-CM | POA: Diagnosis not present

## 2023-07-30 DIAGNOSIS — Z5111 Encounter for antineoplastic chemotherapy: Secondary | ICD-10-CM | POA: Diagnosis not present

## 2023-07-30 DIAGNOSIS — Z87891 Personal history of nicotine dependence: Secondary | ICD-10-CM | POA: Diagnosis not present

## 2023-07-30 MED ORDER — PEGFILGRASTIM-JMDB 6 MG/0.6ML ~~LOC~~ SOSY
6.0000 mg | PREFILLED_SYRINGE | Freq: Once | SUBCUTANEOUS | Status: AC
  Administered 2023-07-30: 6 mg via SUBCUTANEOUS
  Filled 2023-07-30: qty 0.6

## 2023-08-04 ENCOUNTER — Encounter: Payer: Self-pay | Admitting: Hematology

## 2023-08-08 MED FILL — Fosaprepitant Dimeglumine For IV Infusion 150 MG (Base Eq): INTRAVENOUS | Qty: 5 | Status: AC

## 2023-08-11 ENCOUNTER — Encounter: Payer: Self-pay | Admitting: Hematology

## 2023-08-11 ENCOUNTER — Inpatient Hospital Stay

## 2023-08-11 ENCOUNTER — Inpatient Hospital Stay: Payer: 59

## 2023-08-11 ENCOUNTER — Other Ambulatory Visit: Payer: Self-pay | Admitting: Hematology

## 2023-08-11 VITALS — BP 106/68 | HR 85 | Temp 97.9°F | Resp 18 | Wt 146.4 lb

## 2023-08-11 DIAGNOSIS — Z5111 Encounter for antineoplastic chemotherapy: Secondary | ICD-10-CM | POA: Diagnosis not present

## 2023-08-11 DIAGNOSIS — Z79630 Long term (current) use of alkylating agent: Secondary | ICD-10-CM | POA: Diagnosis not present

## 2023-08-11 DIAGNOSIS — Z5112 Encounter for antineoplastic immunotherapy: Secondary | ICD-10-CM | POA: Diagnosis not present

## 2023-08-11 DIAGNOSIS — D500 Iron deficiency anemia secondary to blood loss (chronic): Secondary | ICD-10-CM

## 2023-08-11 DIAGNOSIS — Z79633 Long term (current) use of mitotic inhibitor: Secondary | ICD-10-CM | POA: Diagnosis not present

## 2023-08-11 DIAGNOSIS — Z87891 Personal history of nicotine dependence: Secondary | ICD-10-CM | POA: Diagnosis not present

## 2023-08-11 DIAGNOSIS — R109 Unspecified abdominal pain: Secondary | ICD-10-CM | POA: Diagnosis not present

## 2023-08-11 DIAGNOSIS — K922 Gastrointestinal hemorrhage, unspecified: Secondary | ICD-10-CM

## 2023-08-11 DIAGNOSIS — Z95828 Presence of other vascular implants and grafts: Secondary | ICD-10-CM

## 2023-08-11 DIAGNOSIS — C8178 Other classical Hodgkin lymphoma, lymph nodes of multiple sites: Secondary | ICD-10-CM

## 2023-08-11 DIAGNOSIS — R634 Abnormal weight loss: Secondary | ICD-10-CM | POA: Diagnosis not present

## 2023-08-11 DIAGNOSIS — M545 Low back pain, unspecified: Secondary | ICD-10-CM | POA: Diagnosis not present

## 2023-08-11 DIAGNOSIS — Z79632 Long term (current) use of antitumor antibiotic: Secondary | ICD-10-CM | POA: Diagnosis not present

## 2023-08-11 DIAGNOSIS — Z79899 Other long term (current) drug therapy: Secondary | ICD-10-CM | POA: Diagnosis not present

## 2023-08-11 DIAGNOSIS — C8330 Diffuse large B-cell lymphoma, unspecified site: Secondary | ICD-10-CM | POA: Diagnosis not present

## 2023-08-11 DIAGNOSIS — Z79620 Long term (current) use of immunosuppressive biologic: Secondary | ICD-10-CM | POA: Diagnosis not present

## 2023-08-11 DIAGNOSIS — Z7952 Long term (current) use of systemic steroids: Secondary | ICD-10-CM | POA: Diagnosis not present

## 2023-08-11 DIAGNOSIS — K31811 Angiodysplasia of stomach and duodenum with bleeding: Secondary | ICD-10-CM | POA: Diagnosis not present

## 2023-08-11 DIAGNOSIS — E46 Unspecified protein-calorie malnutrition: Secondary | ICD-10-CM | POA: Diagnosis not present

## 2023-08-11 DIAGNOSIS — R918 Other nonspecific abnormal finding of lung field: Secondary | ICD-10-CM | POA: Diagnosis not present

## 2023-08-11 DIAGNOSIS — Z803 Family history of malignant neoplasm of breast: Secondary | ICD-10-CM | POA: Diagnosis not present

## 2023-08-11 DIAGNOSIS — D62 Acute posthemorrhagic anemia: Secondary | ICD-10-CM | POA: Diagnosis not present

## 2023-08-11 DIAGNOSIS — I2510 Atherosclerotic heart disease of native coronary artery without angina pectoris: Secondary | ICD-10-CM | POA: Diagnosis not present

## 2023-08-11 DIAGNOSIS — D509 Iron deficiency anemia, unspecified: Secondary | ICD-10-CM | POA: Diagnosis not present

## 2023-08-11 LAB — CBC WITH DIFFERENTIAL (CANCER CENTER ONLY)
Abs Immature Granulocytes: 0.1 10*3/uL — ABNORMAL HIGH (ref 0.00–0.07)
Basophils Absolute: 0.1 10*3/uL (ref 0.0–0.1)
Basophils Relative: 1 %
Eosinophils Absolute: 0.2 10*3/uL (ref 0.0–0.5)
Eosinophils Relative: 2 %
HCT: 26.7 % — ABNORMAL LOW (ref 39.0–52.0)
Hemoglobin: 8.4 g/dL — ABNORMAL LOW (ref 13.0–17.0)
Immature Granulocytes: 1 %
Lymphocytes Relative: 11 %
Lymphs Abs: 1.4 10*3/uL (ref 0.7–4.0)
MCH: 25.4 pg — ABNORMAL LOW (ref 26.0–34.0)
MCHC: 31.5 g/dL (ref 30.0–36.0)
MCV: 80.7 fL (ref 80.0–100.0)
Monocytes Absolute: 0.8 10*3/uL (ref 0.1–1.0)
Monocytes Relative: 6 %
Neutro Abs: 10.5 10*3/uL — ABNORMAL HIGH (ref 1.7–7.7)
Neutrophils Relative %: 79 %
Platelet Count: 438 10*3/uL — ABNORMAL HIGH (ref 150–400)
RBC: 3.31 MIL/uL — ABNORMAL LOW (ref 4.22–5.81)
RDW: 22.2 % — ABNORMAL HIGH (ref 11.5–15.5)
WBC Count: 13 10*3/uL — ABNORMAL HIGH (ref 4.0–10.5)
nRBC: 0 % (ref 0.0–0.2)

## 2023-08-11 LAB — CMP (CANCER CENTER ONLY)
ALT: 46 U/L — ABNORMAL HIGH (ref 0–44)
AST: 21 U/L (ref 15–41)
Albumin: 3.6 g/dL (ref 3.5–5.0)
Alkaline Phosphatase: 89 U/L (ref 38–126)
Anion gap: 8 (ref 5–15)
BUN: 9 mg/dL (ref 8–23)
CO2: 27 mmol/L (ref 22–32)
Calcium: 9.3 mg/dL (ref 8.9–10.3)
Chloride: 99 mmol/L (ref 98–111)
Creatinine: 0.81 mg/dL (ref 0.61–1.24)
GFR, Estimated: >60 mL/min (ref >60)
Glucose, Bld: 146 mg/dL — ABNORMAL HIGH (ref 70–99)
Potassium: 3.6 mmol/L (ref 3.5–5.1)
Sodium: 134 mmol/L — ABNORMAL LOW (ref 135–145)
Total Bilirubin: 0.3 mg/dL (ref 0.0–1.2)
Total Protein: 7.2 g/dL (ref 6.5–8.1)

## 2023-08-11 MED ORDER — ACETAMINOPHEN 325 MG PO TABS
650.0000 mg | ORAL_TABLET | Freq: Once | ORAL | Status: AC
  Administered 2023-08-11: 650 mg via ORAL
  Filled 2023-08-11: qty 2

## 2023-08-11 MED ORDER — EPINEPHRINE 0.3 MG/0.3ML IJ SOAJ: 0.3000 mg | Freq: Once | INTRAMUSCULAR | Status: DC | PRN

## 2023-08-11 MED ORDER — SODIUM CHLORIDE 0.9 % IV SOLN: INTRAVENOUS | Status: DC

## 2023-08-11 MED ORDER — VINBLASTINE SULFATE CHEMO INJECTION 1 MG/ML
6.0000 mg/m2 | Freq: Once | INTRAVENOUS | Status: AC
  Administered 2023-08-11: 10.9 mg via INTRAVENOUS
  Filled 2023-08-11: qty 10.9

## 2023-08-11 MED ORDER — PALONOSETRON HCL INJECTION 0.25 MG/5ML
0.2500 mg | Freq: Once | INTRAVENOUS | Status: AC
  Administered 2023-08-11: 0.25 mg via INTRAVENOUS
  Filled 2023-08-11: qty 5

## 2023-08-11 MED ORDER — LORATADINE 10 MG PO TABS
10.0000 mg | ORAL_TABLET | Freq: Once | ORAL | Status: AC
Start: 2023-08-11 — End: 2023-08-11
  Administered 2023-08-11: 10 mg via ORAL
  Filled 2023-08-11: qty 1

## 2023-08-11 MED ORDER — SODIUM CHLORIDE 0.9 % IV SOLN
300.0000 mg | Freq: Once | INTRAVENOUS | Status: AC
  Administered 2023-08-11: 300.0065 mg via INTRAVENOUS
  Filled 2023-08-11: qty 300

## 2023-08-11 MED ORDER — DOXORUBICIN HCL CHEMO IV INJECTION 2 MG/ML
25.0000 mg/m2 | Freq: Once | INTRAVENOUS | Status: AC
  Administered 2023-08-11: 46 mg via INTRAVENOUS
  Filled 2023-08-11: qty 23

## 2023-08-11 MED ORDER — SODIUM CHLORIDE 0.9% FLUSH
10.0000 mL | Freq: Once | INTRAVENOUS | Status: AC | PRN
  Administered 2023-08-11: 10 mL

## 2023-08-11 MED ORDER — ALBUTEROL SULFATE HFA 108 (90 BASE) MCG/ACT IN AERS: 2.0000 | INHALATION_SPRAY | Freq: Once | RESPIRATORY_TRACT | Status: DC | PRN

## 2023-08-11 MED ORDER — DEXAMETHASONE SODIUM PHOSPHATE 10 MG/ML IJ SOLN
10.0000 mg | Freq: Once | INTRAMUSCULAR | Status: AC
  Administered 2023-08-11: 10 mg via INTRAVENOUS
  Filled 2023-08-11: qty 1

## 2023-08-11 MED ORDER — HEPARIN SOD (PORK) LOCK FLUSH 100 UNIT/ML IV SOLN
500.0000 [IU] | Freq: Once | INTRAVENOUS | Status: AC | PRN
  Administered 2023-08-11: 500 [IU]

## 2023-08-11 MED ORDER — SODIUM CHLORIDE 0.9% FLUSH
10.0000 mL | INTRAVENOUS | Status: DC | PRN
  Administered 2023-08-11: 10 mL

## 2023-08-11 MED ORDER — FAMOTIDINE IN NACL 20-0.9 MG/50ML-% IV SOLN: 20.0000 mg | Freq: Once | INTRAVENOUS | Status: DC | PRN

## 2023-08-11 MED ORDER — DIPHENHYDRAMINE HCL 50 MG/ML IJ SOLN
50.0000 mg | Freq: Once | INTRAMUSCULAR | Status: AC
  Administered 2023-08-11: 50 mg via INTRAVENOUS
  Filled 2023-08-11: qty 1

## 2023-08-11 MED ORDER — SODIUM CHLORIDE 0.9 % IV SOLN
375.0000 mg/m2 | Freq: Once | INTRAVENOUS | Status: AC
  Administered 2023-08-11: 680 mg via INTRAVENOUS
  Filled 2023-08-11: qty 68

## 2023-08-11 MED ORDER — DIPHENHYDRAMINE HCL 50 MG/ML IJ SOLN: 50.0000 mg | Freq: Once | INTRAMUSCULAR | Status: DC | PRN

## 2023-08-11 MED ORDER — SODIUM CHLORIDE 0.9 % IV SOLN
150.0000 mg | Freq: Once | INTRAVENOUS | Status: AC
  Administered 2023-08-11: 150 mg via INTRAVENOUS
  Filled 2023-08-11: qty 150

## 2023-08-11 MED ORDER — METHYLPREDNISOLONE SODIUM SUCC 125 MG IJ SOLR: 125.0000 mg | Freq: Once | INTRAMUSCULAR | Status: DC | PRN

## 2023-08-11 NOTE — Progress Notes (Signed)
 Nutrition Follow-up:  Patient with stage IV , hodgkin lymphoma (gray zone lymphoma).  Patient receiving adrimaycin, dacarbacine, vinblastine.  Met with patient during infusion.  Reports that his appetite is up and down.  Sometimes does not have a desire to eat.  Does not like the way remeron makes him feel (too drowsy).  He is not currently taking remeron.  Wife is preparing meals and encouraging him to eat.  He feels like he is eating better overall.  Switched to the 350 calorie shakes BID.      Medications: reviewed  Labs: reviewed  Anthropometrics:   Weight 146 lb 6.4 oz today  148 lb 1.6 oz  160 lb on 1/29  NUTRITION DIAGNOSIS: Inadequate oral intake continues    INTERVENTION:  Consider trial of another appetite stimulant as currently not taking remeron. Message sent to MD Reviewed ways to add calories to foods Provided suggestions on how to make smoothies/shakes.  Written information provided     MONITORING, EVALUATION, GOAL: weight trends, intake   NEXT VISIT: Monday, April 28 during infusion  Retaj Hilbun B. Elease Hashimoto, CSO, LDN Registered Dietitian [phone]

## 2023-08-11 NOTE — Progress Notes (Signed)
 Per MD Candise Che, ok to proceed with TX today with elevated HR. MD aware of dizziness and SOB. EKG ordered and pt cleared for tx by MD.   Per MD, hold Adcetris today due to inc in neuropathy

## 2023-08-11 NOTE — Patient Instructions (Signed)
 [facility] - A DEPT OF [facility]  Discharge Instructions: Thank you for choosing [facility] to provide your oncology and hematology care.   If you have a lab appointment with the Cancer Center, please go directly to the Cancer Center and check in at the registration area.   Wear comfortable clothing and clothing appropriate for easy access to any Portacath or PICC line.   We strive to give you quality time with your provider. You may need to reschedule your appointment if you arrive late (15 or more minutes).  Arriving late affects you and other patients whose appointments are after yours.  Also, if you miss three or more appointments without notifying the office, you may be dismissed from the clinic at the provider's discretion.      For prescription refill requests, have your pharmacy contact our office and allow 72 hours for refills to be completed.    Today you received the following chemotherapy and/or immunotherapy agents: Doxorubicin, Vinblastine, Dacarbazine.       To help prevent nausea and vomiting after your treatment, we encourage you to take your nausea medication as directed.  BELOW ARE SYMPTOMS THAT SHOULD BE REPORTED IMMEDIATELY: *FEVER GREATER THAN 100.4 F (38 C) OR HIGHER *CHILLS OR SWEATING *NAUSEA AND VOMITING THAT IS NOT CONTROLLED WITH YOUR NAUSEA MEDICATION *UNUSUAL SHORTNESS OF BREATH *UNUSUAL BRUISING OR BLEEDING *URINARY PROBLEMS (pain or burning when urinating, or frequent urination) *BOWEL PROBLEMS (unusual diarrhea, constipation, pain near the anus) TENDERNESS IN MOUTH AND THROAT WITH OR WITHOUT PRESENCE OF ULCERS (sore throat, sores in mouth, or a toothache) UNUSUAL RASH, SWELLING OR PAIN  UNUSUAL VAGINAL DISCHARGE OR ITCHING   Items with * indicate a potential emergency and should be followed up as soon as possible or go to the Emergency Department if any problems should occur.  Please show the  CHEMOTHERAPY ALERT CARD or IMMUNOTHERAPY ALERT CARD at check-in to the Emergency Department and triage nurse.  Should you have questions after your visit or need to cancel or reschedule your appointment, please contact [facility] - A DEPT OF [facility]  Dept: [phone]  and follow the prompts.  Office hours are 8:00 a.m. to 4:30 p.m. Monday - Friday. Please note that voicemails left after 4:00 p.m. may not be returned until the following business day.  We are closed weekends and major holidays. You have access to a nurse at all times for urgent questions. Please call the main number to the clinic Dept: [phone] and follow the prompts.   For any non-urgent questions, you may also contact your provider using MyChart. We now offer e-Visits for anyone 76 and older to request care online for non-urgent symptoms. For details visit [email or web address].   Also download the MyChart app! Go to the app store, search "MyChart", open the app, select [place], and log in with your MyChart username and password.  Iron Sucrose Injection What is this medication? IRON SUCROSE (EYE ern SOO krose) treats low levels of iron (iron deficiency anemia) in people with kidney disease. Iron is a mineral that plays an important role in making red blood cells, which carry oxygen from your lungs to the rest of your body. This medicine may be used for other purposes; ask your health care provider or pharmacist if you have questions. COMMON BRAND NAME(S): Venofer What should I tell my care team before I take this medication? They need  to know if you have any of these conditions: Anemia not caused by low iron levels Heart disease High levels of iron in the blood Kidney disease Liver disease An unusual or allergic reaction to iron, other medications, foods, dyes, or preservatives Pregnant or trying to get pregnant Breastfeeding How should I use this medication? This medication  is infused into a vein. It is given by your care team in a hospital or clinic setting. Talk to your care team about the use of this medication in children. While it may be prescribed for children as young as 2 years for selected conditions, precautions do apply. Overdosage: If you think you have taken too much of this medicine contact a poison control center or emergency room at once. NOTE: This medicine is only for you. Do not share this medicine with others. What if I miss a dose? Keep appointments for follow-up doses. It is important not to miss your dose. Call your care team if you are unable to keep an appointment. What may interact with this medication? Do not take this medication with any of the following: Deferoxamine Dimercaprol Other iron products This medication may also interact with the following: Chloramphenicol Deferasirox This list may not describe all possible interactions. Give your health care provider a list of all the medicines, herbs, non-prescription drugs, or dietary supplements you use. Also tell them if you smoke, drink alcohol, or use illegal drugs. Some items may interact with your medicine. What should I watch for while using this medication? Visit your care team for regular checks on your progress. Tell your care team if your symptoms do not start to get better or if they get worse. You may need blood work done while you are taking this medication. You may need to eat more foods that contain iron. Talk to your care team. Foods that contain iron include whole grains or cereals, dried fruits, beans, peas, leafy green vegetables, and organ meats (liver, kidney). What side effects may I notice from receiving this medication? Side effects that you should report to your care team as soon as possible: Allergic reactions--skin rash, itching, hives, swelling of the face, lips, tongue, or throat Low blood pressure--dizziness, feeling faint or lightheaded, blurry  vision Shortness of breath Side effects that usually do not require medical attention (report to your care team if they continue or are bothersome): Flushing Headache Joint pain Muscle pain Nausea Pain, redness, or irritation at injection site This list may not describe all possible side effects. Call your doctor for medical advice about side effects. You may report side effects to FDA at 1-800-FDA-1088. Where should I keep my medication? This medication is given in a hospital or clinic. It will not be stored at home. NOTE: This sheet is a summary. It may not cover all possible information. If you have questions about this medicine, talk to your doctor, pharmacist, or health care provider.  2024 Elsevier/Gold Standard (2022-12-18 00:00:00)

## 2023-08-12 ENCOUNTER — Other Ambulatory Visit: Payer: Self-pay

## 2023-08-13 ENCOUNTER — Inpatient Hospital Stay: Payer: 59 | Attending: Hematology

## 2023-08-13 VITALS — BP 114/77 | HR 78 | Temp 98.2°F | Resp 16

## 2023-08-13 DIAGNOSIS — Z79632 Long term (current) use of antitumor antibiotic: Secondary | ICD-10-CM | POA: Insufficient documentation

## 2023-08-13 DIAGNOSIS — Z79620 Long term (current) use of immunosuppressive biologic: Secondary | ICD-10-CM | POA: Insufficient documentation

## 2023-08-13 DIAGNOSIS — E46 Unspecified protein-calorie malnutrition: Secondary | ICD-10-CM | POA: Diagnosis not present

## 2023-08-13 DIAGNOSIS — C8178 Other classical Hodgkin lymphoma, lymph nodes of multiple sites: Secondary | ICD-10-CM

## 2023-08-13 DIAGNOSIS — Z5112 Encounter for antineoplastic immunotherapy: Secondary | ICD-10-CM | POA: Insufficient documentation

## 2023-08-13 DIAGNOSIS — D62 Acute posthemorrhagic anemia: Secondary | ICD-10-CM | POA: Diagnosis not present

## 2023-08-13 DIAGNOSIS — K31811 Angiodysplasia of stomach and duodenum with bleeding: Secondary | ICD-10-CM | POA: Diagnosis not present

## 2023-08-13 DIAGNOSIS — Z5189 Encounter for other specified aftercare: Secondary | ICD-10-CM | POA: Insufficient documentation

## 2023-08-13 DIAGNOSIS — Z79630 Long term (current) use of alkylating agent: Secondary | ICD-10-CM | POA: Insufficient documentation

## 2023-08-13 DIAGNOSIS — Z79899 Other long term (current) drug therapy: Secondary | ICD-10-CM | POA: Insufficient documentation

## 2023-08-13 DIAGNOSIS — C8338 Diffuse large B-cell lymphoma, lymph nodes of multiple sites: Secondary | ICD-10-CM | POA: Diagnosis present

## 2023-08-13 DIAGNOSIS — Z87891 Personal history of nicotine dependence: Secondary | ICD-10-CM | POA: Insufficient documentation

## 2023-08-13 DIAGNOSIS — Z79633 Long term (current) use of mitotic inhibitor: Secondary | ICD-10-CM | POA: Insufficient documentation

## 2023-08-13 DIAGNOSIS — Z5111 Encounter for antineoplastic chemotherapy: Secondary | ICD-10-CM | POA: Insufficient documentation

## 2023-08-13 DIAGNOSIS — Z803 Family history of malignant neoplasm of breast: Secondary | ICD-10-CM | POA: Insufficient documentation

## 2023-08-13 DIAGNOSIS — G629 Polyneuropathy, unspecified: Secondary | ICD-10-CM | POA: Diagnosis not present

## 2023-08-13 MED ORDER — PEGFILGRASTIM-JMDB 6 MG/0.6ML ~~LOC~~ SOSY
6.0000 mg | PREFILLED_SYRINGE | Freq: Once | SUBCUTANEOUS | Status: AC
  Administered 2023-08-13: 6 mg via SUBCUTANEOUS
  Filled 2023-08-13: qty 0.6

## 2023-08-21 NOTE — Progress Notes (Signed)
 HEMATOLOGY/ONCOLOGY CLINIC NOTE  Date of Service: 08/22/2023   Patient Care Team: Patient, No Pcp Per as PCP - General (General Practice)  CHIEF COMPLAINTS/PURPOSE OF CONSULTATION:  Follow-up for continued evaluation and mx of recently diagnosed Hodgkins lymphoma  HISTORY OF PRESENTING ILLNESS:   Timothy Hunter is a wonderful 63 y.o. male who has been referred to us  by Dr. 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   Patient presented to the ED on 03/09/2023 for GI bleed/acute blood loss anemia/microcytic anemia/low vitamin B12 levels and was seen by Dr. Lee Public.   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   Today, he is accompanied by his wife. He reports that he was recently seen in the hospital for anemia, with abdominal pain and black stools. His hgb was in 6s at the time. Patient received a CT scan of the abdomen and chest and was found to have several enlarged lymph glands in the center of his chest, including lung nodules and findings in the upper abdomen, bone, and thoracic spine. A biopsy of lymph node in retroperitoneum did show concerns for lymphoma.  Patient reports that he has otherwise been generally been pretty healthy otherwise. He notes taking medication for arthritis and reports a medical history of glaucoma. He denies any other chronic medical issues and does not take any medications on a regular basis otherwise. Patient denies having any surgeries in the past.   He reports that his lower back was evaluated earlier this year due to lower back pain, and there were not any concerning findings at that time. He denies any pain in the middle back. His back pain has  improved recently. His wife reports that his energy level has improved recently.   Patient does not smoke cigarettes at this time. He previously smoked 2 packs a day since he was 63 years old until about 2010. He reports that he rarely consumes alcohol and only consumes alcohol with social use. Patient does smoke marijuana.  There is a fhx of cancer. His wife reports that he has had family members that have passed away from cancer, but she is unsure of the type of cancer they had. His wife reports that patient's younger and older sister do have a history of cancer, and are in remission at this time. His older sister has breast cancer and breast cancer does run in the family. Patient denies any other medical issues in the family. He denies any blood disorders in the family.   Patient reports that he has not endorsed any black stools since being home from the hospital, and denies any other changes in bowel habits.   Patient's wife reports that patient did receive a blood transfusion while in the hospital, as well as potassium, but does not believe that patient received IV iron. His B12 level was also low at that time. He is not on any iron or B12 replacement at this time.   He denies having any other symptoms at this time. Patient denies any lightheadedness/dizziness, SOB, chest pain, abdominal pain, or new leg swelling.  His wife reports that he did take one Tramadol for back pain prevention. He did not have any back pain prior to taking it.   He reports that he has lost 12  pounds over the course of about 3 months. Patient reports that his eating habits are normal at this time. He does drink two Ensure protein drinks a day and has gained 5-6 pounds recently. He reports that he was not put on any steroids recently.   Patient denies new lumps/bumps. His wife reports that patient has a scar on right forehead, which has been present for a while. He denies any swelling in the groin or testicular  pain/swelling.   INTERVAL HISTORY  Timothy Hunter is a 63 y.o. male here for continued evaluation and management of Hodgkins lymphoma. He is scheduled to receive cycle 5 day 1 of A-AVD chemotherapy on 08/25/2023.   Patient was last seen by me on 07/28/2023 and complained of abdominal pain, nail discoloration, mild taste aversion, and mild occasional night sweats, 3-lbs weight loss in 12 days.   Today, he reports that he has been doing well since his last clinical visit. He reports that he has been eating well and gained 8 pounds in 12 days. Patient notes that his highest weight in his lifetime was 166 pounds.   Patient denies fever or chills, but does complain of night sweats. He reports mild SOB.   He denies any uncontrolled pain, abdominal pain, back stools, blood in the stools, mouth sores, uncontrolled nausea, vomiting, diarrhea, or constipation. He reports occasional lightheadedness/dizziness. He did receive IV iron after his last treatment.   He does believe that he generally drinks 64 ounces of water daily.  Patient complains of mild noticeable tingling/numbness only in his hands. He sometimes experiences feet numbness when standing. Moving around does improve symptoms. He denies any pain with his tingling. His symptoms have been stable since last time and does not occur daily. His symptoms persist and does not improve between treatments.   Patient continues to take vitamin B complex gummies and liquid B12 regularly.   MEDICAL HISTORY:  Past Medical History:  Diagnosis Date   Dyspnea    Glaucoma associated with anomalies of iris     SURGICAL HISTORY: Past Surgical History:  Procedure Laterality Date   BIOPSY  03/11/2023   Procedure: BIOPSY;  Surgeon: Genell Ken, MD;  Location: [facility];  Service: Gastroenterology;;   ESOPHAGOGASTRODUODENOSCOPY (EGD) WITH PROPOFOL N/A 03/11/2023   Procedure: ESOPHAGOGASTRODUODENOSCOPY (EGD) WITH PROPOFOL;  Surgeon: Genell Ken, MD;   Location: [facility];  Service: Gastroenterology;  Laterality: N/A;   HOT HEMOSTASIS N/A 03/11/2023   Procedure: HOT HEMOSTASIS (ARGON PLASMA COAGULATION/BICAP);  Surgeon: Genell Ken, MD;  Location: [facility];  Service: Gastroenterology;  Laterality: N/A;   IR IMAGING GUIDED PORT INSERTION  03/26/2023    SOCIAL HISTORY: Social History   Socioeconomic History   Marital status: Married    Spouse name: Not on file   Number of children: Not on file   Years of education: Not on file   Highest education level: Not on file  Occupational History   Not on file  Tobacco Use   Smoking status: Former    Passive exposure: Never   Smokeless tobacco: Not on file  Vaping Use   Vaping status: Never Used  Substance and Sexual Activity   Alcohol use: Yes    Comment: occasionally   Drug use: Yes    Types: Marijuana   Sexual activity: Not on file  Other Topics Concern   Not on file  Social History Narrative   Not on file   Social Drivers of Health   Financial Resource Strain: Not on file  Food Insecurity: No Food Insecurity (03/09/2023)   Hunger Vital Sign    Worried About Running Out of Food in the Last Year: Never true    Ran Out of Food in the Last Year: Never true  Transportation Needs: No Transportation Needs (03/09/2023)   PRAPARE - Administrator, Civil Service (Medical): No    Lack of Transportation (Non-Medical): No  Physical Activity: Not on file  Stress: Not on file  Social Connections: Not on file  Intimate Partner Violence: Not At Risk (03/09/2023)   Humiliation, Afraid, Rape, and Kick questionnaire    Fear of Current or Ex-Partner: No    Emotionally Abused: No    Physically Abused: No    Sexually Abused: No    FAMILY HISTORY: No family history on file.  ALLERGIES:  has no known allergies.  MEDICATIONS:  Current Outpatient Medications  Medication Sig Dispense Refill   calcium-vitamin D (OSCAL WITH D) 500-5 MG-MCG tablet Take 1 tablet by mouth  daily.     clotrimazole-betamethasone (LOTRISONE) cream Apply 1 Application topically 2 (two) times daily. To area of rash on shoulder and back. 30 g 1   Cyanocobalamin (VITAMIN B-12 PO) Take 1 tablet by mouth daily.     dexamethasone (DECADRON) 4 MG tablet Take 2 tablets by mouth once a day for 3 days after chemotherapy. Take with food. 30 tablet 1   feeding supplement (ENSURE ENLIVE / ENSURE PLUS) LIQD Take 237 mLs by mouth 2 (two) times daily between meals. 237 mL 12   HYDROcodone-acetaminophen (NORCO/VICODIN) 5-325 MG tablet Take 1 tablet by mouth every 6 (six) hours as needed for severe pain (pain score 7-10). (Patient not taking: Reported on 06/30/2023) 5 tablet 0   latanoprost (XALATAN) 0.005 % ophthalmic solution Place 1 drop into both eyes at bedtime.     lidocaine-prilocaine (EMLA) cream Apply to affected area once 30 g 3   mirtazapine (REMERON) 15 MG tablet Take 1 tablet (15 mg total) by mouth at bedtime. 30 tablet 1   ondansetron (ZOFRAN) 8 MG tablet Take 1 tablet (8 mg total) by mouth every 8 (eight) hours as needed for nausea or vomiting. Start on the third day after chemotherapy. 30 tablet 1   pantoprazole (PROTONIX) 40 MG tablet Take 1 tablet (40 mg total) by mouth 2 (two) times daily before a meal. 60 tablet 2   prochlorperazine (COMPAZINE) 10 MG tablet Take 1 tablet (10 mg total) by mouth every 6 (six) hours as needed for nausea or vomiting. 30 tablet 1   timolol (TIMOPTIC) 0.5 % ophthalmic solution Place 1 drop into both eyes in the morning.     traMADol (ULTRAM) 50 MG tablet Take 1 tablet (50 mg total) by mouth every 6 (six) hours as needed. 30 tablet 0   No current facility-administered medications for this visit.    REVIEW OF SYSTEMS:    10 Point review of Systems was done is negative except as noted above.   PHYSICAL EXAMINATION: ECOG PERFORMANCE STATUS: 2 - Symptomatic, <50% confined to bed .BP 121/66 (BP Location: Left Arm, Patient Position: Sitting)   Pulse 100    Temp 98 F (36.7 C)   Resp 16   Ht 5\' 9"  (1.753 m)   Wt 154 lb 9.6 oz (70.1 kg)   SpO2 100%   BMI 22.83 kg/m    GENERAL:alert, in no acute distress and comfortable SKIN: no acute rashes, no significant lesions EYES: conjunctiva are pink and non-injected, sclera anicteric OROPHARYNX: MMM, no  exudates, no oropharyngeal erythema or ulceration NECK: supple, no JVD LYMPH:  no palpable lymphadenopathy in the cervical, axillary or inguinal regions LUNGS: clear to auscultation b/l with normal respiratory effort HEART: regular rate & rhythm ABDOMEN:  normoactive bowel sounds , non tender, not distended. Extremity: no pedal edema PSYCH: alert & oriented x 3 with fluent speech NEURO: no focal motor/sensory deficits   LABORATORY DATA:  I have reviewed the data as listed  .    Latest Ref Rng & Units 08/22/2023    9:36 AM 08/11/2023    9:25 AM 07/28/2023   11:15 AM  CBC  WBC 4.0 - 10.5 K/uL 12.2  13.0  14.1   Hemoglobin 13.0 - 17.0 g/dL 7.9  8.4  8.9   Hematocrit 39.0 - 52.0 % 26.2  26.7  27.5   Platelets 150 - 400 K/uL 320  438  485     .    Latest Ref Rng & Units 08/22/2023    9:36 AM 08/11/2023    9:25 AM 07/28/2023   11:15 AM  CMP  Glucose 70 - 99 mg/dL 119  147  829   BUN 8 - 23 mg/dL 8  9  9    Creatinine 0.61 - 1.24 mg/dL 5.62  1.30  8.65   Sodium 135 - 145 mmol/L 135  134  132   Potassium 3.5 - 5.1 mmol/L 3.5  3.6  3.5   Chloride 98 - 111 mmol/L 100  99  97   CO2 22 - 32 mmol/L 27  27  26    Calcium 8.9 - 10.3 mg/dL 9.2  9.3  8.8   Total Protein 6.5 - 8.1 g/dL 7.1  7.2  7.3   Total Bilirubin 0.0 - 1.2 mg/dL 0.3  0.3  0.4   Alkaline Phos 38 - 126 U/L 83  89  111   AST 15 - 41 U/L 13  21  32   ALT 0 - 44 U/L 20  46  64    04/22/2023 right supraclavicular lymph node biopsy:         03/11/2023 biopsy of left retroperitoneal lymph node:     RADIOGRAPHIC STUDIES: I have personally reviewed the radiological images as listed and agreed with the findings in the  report. No results found.    ASSESSMENT & PLAN:   63 y.o. 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 3. Hx of esophageal candidiasis 4.Back pain due to involvement by lymphoma -- pain now resolved. 5.  Protein calorie malnutrition-improving with lymphoma treatment has gained 7 pounds since his last visit . Wt Readings from Last 3 Encounters:  08/11/23 146 lb 6.4 oz (66.4 kg)  07/28/23 148 lb 1.6 oz (67.2 kg)  07/16/23 151 lb 14.4 oz (68.9 kg)    PLAN:  -Discussed lab results on 08/22/2023 in detail with patient. CBC showed WBC of 12.2K, hemoglobin of 7.9, and platelets of 320K. -Platelets normal -WBCs bounced back -educated patient that growth factor helps WBC bounce back quickly -he is still anemic -discussed that part of his anemia may be from chemotherapy, though other causes include ulcers and iron deficiency -discussed the need to aggressively correct his anemia to minimize the number of required blood transfusions -educated patient that there would be indication for blood transfusion with hgb<7. If hgb is between 7-8, then his symptoms would determine whether or not there is a role for blood transfusion -discussed  that patient does not meet absolute criteria for blood transfusion at this time with hgb 7.9.  -discussed option of blood transfusion to improve his dizziness or to receive IV iron with next treatment on Monday -proceed with IV venofer as scheduled  -advised patient to let us  know if he experiences any lightheadedness/dizziness in the meanwhile as we may need to set him up for blood transfusion sooner -patient complains of mild neuropathy with cycle 4 of A-AVD chemotherapy and G-CSF treatment.  -discussed that there is a role to monitor for neuropathy with Adcetris. If his neuropathy is more significant, Adcetris may be held for the last 1-2 cycles.  -Discussed that while the  goal is to reach the deepest response possible, we would not want concern for long-lasting neuropathy -discussed that mild neuropathy tends to reverse once treatment over, though we would need to ensure that there is no concern for severe neuropathy that persists -continue vitamin B complex and B12 for nerve protection -proceed with cycle 5 day 1 of A-AVD chemotherapy with the same supportive medications on 08/25/2023  FOLLOW-UP: Per integrated scheduling  The total time spent in the appointment was 30 minutes* .  All of the patient's questions were answered with apparent satisfaction. The patient knows to call the clinic with any problems, questions or concerns.   Jacquelyn Matt MD MS AAHIVMS [facility] Hematology/Oncology Physician [facility]  .*Total Encounter Time as defined by the Centers for Medicare and Medicaid Services includes, in addition to the face-to-face time of a patient visit (documented in the note above) non-face-to-face time: obtaining and reviewing outside history, ordering and reviewing medications, tests or procedures, care coordination (communications with other health care professionals or caregivers) and documentation in the medical record.    I,Mitra Faeizi,acting as a Neurosurgeon for Jacquelyn Matt, MD.,have documented all relevant documentation on the behalf of Jacquelyn Matt, MD,as directed by  Jacquelyn Matt, MD while in the presence of Jacquelyn Matt, MD.  .I have reviewed the above documentation for accuracy and completeness, and I agree with the above. .Abbygale Lapid Kishore Berk Pilot MD

## 2023-08-22 ENCOUNTER — Other Ambulatory Visit: Payer: Self-pay

## 2023-08-22 ENCOUNTER — Inpatient Hospital Stay (HOSPITAL_BASED_OUTPATIENT_CLINIC_OR_DEPARTMENT_OTHER): Admitting: Hematology

## 2023-08-22 ENCOUNTER — Inpatient Hospital Stay

## 2023-08-22 VITALS — BP 121/66 | HR 100 | Temp 98.0°F | Resp 16 | Ht 69.0 in | Wt 154.6 lb

## 2023-08-22 DIAGNOSIS — D500 Iron deficiency anemia secondary to blood loss (chronic): Secondary | ICD-10-CM

## 2023-08-22 DIAGNOSIS — Z95828 Presence of other vascular implants and grafts: Secondary | ICD-10-CM

## 2023-08-22 DIAGNOSIS — C8178 Other classical Hodgkin lymphoma, lymph nodes of multiple sites: Secondary | ICD-10-CM

## 2023-08-22 DIAGNOSIS — Z5112 Encounter for antineoplastic immunotherapy: Secondary | ICD-10-CM | POA: Diagnosis not present

## 2023-08-22 DIAGNOSIS — K922 Gastrointestinal hemorrhage, unspecified: Secondary | ICD-10-CM

## 2023-08-22 DIAGNOSIS — Z5111 Encounter for antineoplastic chemotherapy: Secondary | ICD-10-CM

## 2023-08-22 LAB — CMP (CANCER CENTER ONLY)
ALT: 20 U/L (ref 0–44)
AST: 13 U/L — ABNORMAL LOW (ref 15–41)
Albumin: 3.7 g/dL (ref 3.5–5.0)
Alkaline Phosphatase: 83 U/L (ref 38–126)
Anion gap: 8 (ref 5–15)
BUN: 8 mg/dL (ref 8–23)
CO2: 27 mmol/L (ref 22–32)
Calcium: 9.2 mg/dL (ref 8.9–10.3)
Chloride: 100 mmol/L (ref 98–111)
Creatinine: 0.75 mg/dL (ref 0.61–1.24)
GFR, Estimated: >60 mL/min (ref >60)
Glucose, Bld: 124 mg/dL — ABNORMAL HIGH (ref 70–99)
Potassium: 3.5 mmol/L (ref 3.5–5.1)
Sodium: 135 mmol/L (ref 135–145)
Total Bilirubin: 0.3 mg/dL (ref 0.0–1.2)
Total Protein: 7.1 g/dL (ref 6.5–8.1)

## 2023-08-22 LAB — CBC WITH DIFFERENTIAL (CANCER CENTER ONLY)
Abs Immature Granulocytes: 0.66 10*3/uL — ABNORMAL HIGH (ref 0.00–0.07)
Basophils Absolute: 0.1 10*3/uL (ref 0.0–0.1)
Basophils Relative: 0 %
Eosinophils Absolute: 0.2 10*3/uL (ref 0.0–0.5)
Eosinophils Relative: 2 %
HCT: 26.2 % — ABNORMAL LOW (ref 39.0–52.0)
Hemoglobin: 7.9 g/dL — ABNORMAL LOW (ref 13.0–17.0)
Immature Granulocytes: 5 %
Lymphocytes Relative: 13 %
Lymphs Abs: 1.6 10*3/uL (ref 0.7–4.0)
MCH: 25.2 pg — ABNORMAL LOW (ref 26.0–34.0)
MCHC: 30.2 g/dL (ref 30.0–36.0)
MCV: 83.7 fL (ref 80.0–100.0)
Monocytes Absolute: 0.9 10*3/uL (ref 0.1–1.0)
Monocytes Relative: 7 %
Neutro Abs: 8.8 10*3/uL — ABNORMAL HIGH (ref 1.7–7.7)
Neutrophils Relative %: 73 %
Platelet Count: 320 10*3/uL (ref 150–400)
RBC: 3.13 MIL/uL — ABNORMAL LOW (ref 4.22–5.81)
RDW: 23.5 % — ABNORMAL HIGH (ref 11.5–15.5)
WBC Count: 12.2 10*3/uL — ABNORMAL HIGH (ref 4.0–10.5)
nRBC: 0.4 % — ABNORMAL HIGH (ref 0.0–0.2)

## 2023-08-22 MED ORDER — HEPARIN SOD (PORK) LOCK FLUSH 100 UNIT/ML IV SOLN
250.0000 [IU] | Freq: Once | INTRAVENOUS | Status: AC | PRN
  Administered 2023-08-22: 250 [IU]

## 2023-08-22 MED ORDER — SODIUM CHLORIDE 0.9% FLUSH
3.0000 mL | Freq: Once | INTRAVENOUS | Status: AC | PRN
  Administered 2023-08-22: 3 mL

## 2023-08-22 MED FILL — Fosaprepitant Dimeglumine For IV Infusion 150 MG (Base Eq): INTRAVENOUS | Qty: 5 | Status: AC

## 2023-08-25 ENCOUNTER — Inpatient Hospital Stay

## 2023-08-25 VITALS — BP 106/69 | HR 83 | Temp 98.5°F | Resp 18 | Wt 155.0 lb

## 2023-08-25 DIAGNOSIS — C8178 Other classical Hodgkin lymphoma, lymph nodes of multiple sites: Secondary | ICD-10-CM

## 2023-08-25 DIAGNOSIS — Z95828 Presence of other vascular implants and grafts: Secondary | ICD-10-CM

## 2023-08-25 DIAGNOSIS — Z5112 Encounter for antineoplastic immunotherapy: Secondary | ICD-10-CM | POA: Diagnosis not present

## 2023-08-25 DIAGNOSIS — D500 Iron deficiency anemia secondary to blood loss (chronic): Secondary | ICD-10-CM

## 2023-08-25 DIAGNOSIS — K922 Gastrointestinal hemorrhage, unspecified: Secondary | ICD-10-CM

## 2023-08-25 MED ORDER — LORATADINE 10 MG PO TABS
10.0000 mg | ORAL_TABLET | Freq: Once | ORAL | Status: DC
Start: 2023-08-25 — End: 2023-08-25

## 2023-08-25 MED ORDER — DIPHENHYDRAMINE HCL 50 MG/ML IJ SOLN
50.0000 mg | Freq: Once | INTRAMUSCULAR | Status: AC
Start: 2023-08-25 — End: 2023-08-25
  Administered 2023-08-25: 50 mg via INTRAVENOUS
  Filled 2023-08-25: qty 1

## 2023-08-25 MED ORDER — SODIUM CHLORIDE 0.9 % IV SOLN
1.2000 mg/kg | Freq: Once | INTRAVENOUS | Status: AC
  Administered 2023-08-25: 80 mg via INTRAVENOUS
  Filled 2023-08-25: qty 16

## 2023-08-25 MED ORDER — VINBLASTINE SULFATE CHEMO INJECTION 1 MG/ML
6.0000 mg/m2 | Freq: Once | INTRAVENOUS | Status: AC
  Administered 2023-08-25: 10.9 mg via INTRAVENOUS
  Filled 2023-08-25: qty 10.9

## 2023-08-25 MED ORDER — FOSAPREPITANT DIMEGLUMINE INJECTION 150 MG
150.0000 mg | Freq: Once | INTRAVENOUS | Status: AC
  Administered 2023-08-25: 150 mg via INTRAVENOUS
  Filled 2023-08-25: qty 150

## 2023-08-25 MED ORDER — SODIUM CHLORIDE 0.9 % IV SOLN
375.0000 mg/m2 | Freq: Once | INTRAVENOUS | Status: AC
  Administered 2023-08-25: 680 mg via INTRAVENOUS
  Filled 2023-08-25: qty 68

## 2023-08-25 MED ORDER — DEXAMETHASONE SODIUM PHOSPHATE 10 MG/ML IJ SOLN
10.0000 mg | Freq: Once | INTRAMUSCULAR | Status: AC
  Administered 2023-08-25: 10 mg via INTRAVENOUS
  Filled 2023-08-25: qty 1

## 2023-08-25 MED ORDER — DOXORUBICIN HCL CHEMO IV INJECTION 2 MG/ML
25.0000 mg/m2 | Freq: Once | INTRAVENOUS | Status: AC
Start: 2023-08-25 — End: 2023-08-25
  Administered 2023-08-25: 46 mg via INTRAVENOUS
  Filled 2023-08-25: qty 23

## 2023-08-25 MED ORDER — PALONOSETRON HCL INJECTION 0.25 MG/5ML
0.2500 mg | Freq: Once | INTRAVENOUS | Status: AC
  Administered 2023-08-25: 0.25 mg via INTRAVENOUS
  Filled 2023-08-25: qty 5

## 2023-08-25 MED ORDER — ACETAMINOPHEN 325 MG PO TABS
650.0000 mg | ORAL_TABLET | Freq: Once | ORAL | Status: AC
  Administered 2023-08-25: 650 mg via ORAL
  Filled 2023-08-25: qty 2

## 2023-08-25 MED ORDER — SODIUM CHLORIDE 0.9% FLUSH
10.0000 mL | INTRAVENOUS | Status: DC | PRN
  Administered 2023-08-25: 10 mL

## 2023-08-25 MED ORDER — SODIUM CHLORIDE 0.9 % IV SOLN: INTRAVENOUS | Status: DC

## 2023-08-25 MED ORDER — HEPARIN SOD (PORK) LOCK FLUSH 100 UNIT/ML IV SOLN
500.0000 [IU] | Freq: Once | INTRAVENOUS | Status: AC | PRN
  Administered 2023-08-25: 500 [IU]

## 2023-08-25 MED ORDER — SODIUM CHLORIDE 0.9 % IV SOLN
300.0000 mg | Freq: Once | INTRAVENOUS | Status: AC
  Administered 2023-08-25: 300 mg via INTRAVENOUS
  Filled 2023-08-25: qty 300

## 2023-08-25 NOTE — Patient Instructions (Addendum)
 [facility] - A DEPT OF [facility].  HOSPITAL  Discharge Instructions: Thank you for choosing [facility] to provide your oncology and hematology care.   If you have a lab appointment with the Cancer Center, please go directly to the Cancer Center and check in at the registration area.   Wear comfortable clothing and clothing appropriate for easy access to any Portacath or PICC line.   We strive to give you quality time with your provider. You may need to reschedule your appointment if you arrive late (15 or more minutes).  Arriving late affects you and other patients whose appointments are after yours.  Also, if you miss three or more appointments without notifying the office, you may be dismissed from the clinic at the provider's discretion.      For prescription refill requests, have your pharmacy contact our office and allow 72 hours for refills to be completed.    Today you received the following chemotherapy and/or immunotherapy agents: Doxorubicin, Vinblastine, Dacarbazine, Brentuximab.       To help prevent nausea and vomiting after your treatment, we encourage you to take your nausea medication as directed.  BELOW ARE SYMPTOMS THAT SHOULD BE REPORTED IMMEDIATELY: *FEVER GREATER THAN 100.4 F (38 C) OR HIGHER *CHILLS OR SWEATING *NAUSEA AND VOMITING THAT IS NOT CONTROLLED WITH YOUR NAUSEA MEDICATION *UNUSUAL SHORTNESS OF BREATH *UNUSUAL BRUISING OR BLEEDING *URINARY PROBLEMS (pain or burning when urinating, or frequent urination) *BOWEL PROBLEMS (unusual diarrhea, constipation, pain near the anus) TENDERNESS IN MOUTH AND THROAT WITH OR WITHOUT PRESENCE OF ULCERS (sore throat, sores in mouth, or a toothache) UNUSUAL RASH, SWELLING OR PAIN  UNUSUAL VAGINAL DISCHARGE OR ITCHING   Items with * indicate a potential emergency and should be followed up as soon as possible or go to the Emergency Department if any problems should occur.  Please show  the CHEMOTHERAPY ALERT CARD or IMMUNOTHERAPY ALERT CARD at check-in to the Emergency Department and triage nurse.  Should you have questions after your visit or need to cancel or reschedule your appointment, please contact [facility] - A DEPT OF [facility]  Dept: [phone]  and follow the prompts.  Office hours are 8:00 a.m. to 4:30 p.m. Monday - Friday. Please note that voicemails left after 4:00 p.m. may not be returned until the following business day.  We are closed weekends and major holidays. You have access to a nurse at all times for urgent questions. Please call the main number to the clinic Dept: [phone] and follow the prompts.   For any non-urgent questions, you may also contact your provider using MyChart. We now offer e-Visits for anyone 79 and older to request care online for non-urgent symptoms. For details visit [email or web address].   Also download the MyChart app! Go to the app store, search "MyChart", open the app, select [place], and log in with your MyChart username and password.  Iron Sucrose Injection What is this medication? IRON SUCROSE (EYE ern SOO krose) treats low levels of iron (iron deficiency anemia) in people with kidney disease. Iron is a mineral that plays an important role in making red blood cells, which carry oxygen from your lungs to the rest of your body. This medicine may be used for other purposes; ask your health care provider or pharmacist if you have questions. COMMON BRAND NAME(S): Venofer What should I tell my care team before I take this medication? They  need to know if you have any of these conditions: Anemia not caused by low iron levels Heart disease High levels of iron in the blood Kidney disease Liver disease An unusual or allergic reaction to iron, other medications, foods, dyes, or preservatives Pregnant or trying to get pregnant Breastfeeding How should I use this medication? This  medication is infused into a vein. It is given by your care team in a hospital or clinic setting. Talk to your care team about the use of this medication in children. While it may be prescribed for children as young as 2 years for selected conditions, precautions do apply. Overdosage: If you think you have taken too much of this medicine contact a poison control center or emergency room at once. NOTE: This medicine is only for you. Do not share this medicine with others. What if I miss a dose? Keep appointments for follow-up doses. It is important not to miss your dose. Call your care team if you are unable to keep an appointment. What may interact with this medication? Do not take this medication with any of the following: Deferoxamine Dimercaprol Other iron products This medication may also interact with the following: Chloramphenicol Deferasirox This list may not describe all possible interactions. Give your health care provider a list of all the medicines, herbs, non-prescription drugs, or dietary supplements you use. Also tell them if you smoke, drink alcohol, or use illegal drugs. Some items may interact with your medicine. What should I watch for while using this medication? Visit your care team for regular checks on your progress. Tell your care team if your symptoms do not start to get better or if they get worse. You may need blood work done while you are taking this medication. You may need to eat more foods that contain iron. Talk to your care team. Foods that contain iron include whole grains or cereals, dried fruits, beans, peas, leafy green vegetables, and organ meats (liver, kidney). What side effects may I notice from receiving this medication? Side effects that you should report to your care team as soon as possible: Allergic reactions--skin rash, itching, hives, swelling of the face, lips, tongue, or throat Low blood pressure--dizziness, feeling faint or lightheaded, blurry  vision Shortness of breath Side effects that usually do not require medical attention (report to your care team if they continue or are bothersome): Flushing Headache Joint pain Muscle pain Nausea Pain, redness, or irritation at injection site This list may not describe all possible side effects. Call your doctor for medical advice about side effects. You may report side effects to FDA at 1-800-FDA-1088. Where should I keep my medication? This medication is given in a hospital or clinic. It will not be stored at home. NOTE: This sheet is a summary. It may not cover all possible information. If you have questions about this medicine, talk to your doctor, pharmacist, or health care provider.  2024 Elsevier/Gold Standard (2022-12-18 00:00:00)

## 2023-08-25 NOTE — Progress Notes (Signed)
 Pt declined 30 minute observation period post Venofer infusion. Tolerated well. VSS at time of discharge.

## 2023-08-25 NOTE — Progress Notes (Signed)
 Echo will not be repeated at this time. Pt ok to continue tx per Dr Salomon Cree.

## 2023-08-27 ENCOUNTER — Inpatient Hospital Stay

## 2023-08-27 VITALS — BP 121/70 | HR 80 | Temp 98.5°F | Resp 18

## 2023-08-27 DIAGNOSIS — Z5112 Encounter for antineoplastic immunotherapy: Secondary | ICD-10-CM | POA: Diagnosis not present

## 2023-08-27 DIAGNOSIS — C8178 Other classical Hodgkin lymphoma, lymph nodes of multiple sites: Secondary | ICD-10-CM

## 2023-08-27 MED ORDER — PEGFILGRASTIM-JMDB 6 MG/0.6ML ~~LOC~~ SOSY
6.0000 mg | PREFILLED_SYRINGE | Freq: Once | SUBCUTANEOUS | Status: AC
  Administered 2023-08-27: 6 mg via SUBCUTANEOUS
  Filled 2023-08-27: qty 0.6

## 2023-08-28 ENCOUNTER — Encounter: Payer: Self-pay | Admitting: Hematology

## 2023-08-29 ENCOUNTER — Other Ambulatory Visit: Payer: Self-pay

## 2023-08-29 DIAGNOSIS — C8198 Hodgkin lymphoma, unspecified, lymph nodes of multiple sites: Secondary | ICD-10-CM

## 2023-09-01 ENCOUNTER — Inpatient Hospital Stay

## 2023-09-01 VITALS — BP 121/75 | HR 90 | Temp 98.6°F | Resp 17

## 2023-09-01 DIAGNOSIS — K922 Gastrointestinal hemorrhage, unspecified: Secondary | ICD-10-CM

## 2023-09-01 DIAGNOSIS — D500 Iron deficiency anemia secondary to blood loss (chronic): Secondary | ICD-10-CM

## 2023-09-01 DIAGNOSIS — C8198 Hodgkin lymphoma, unspecified, lymph nodes of multiple sites: Secondary | ICD-10-CM

## 2023-09-01 DIAGNOSIS — Z95828 Presence of other vascular implants and grafts: Secondary | ICD-10-CM

## 2023-09-01 DIAGNOSIS — Z5112 Encounter for antineoplastic immunotherapy: Secondary | ICD-10-CM | POA: Diagnosis not present

## 2023-09-01 LAB — CBC WITH DIFFERENTIAL (CANCER CENTER ONLY)
Abs Immature Granulocytes: 0.09 10*3/uL — ABNORMAL HIGH (ref 0.00–0.07)
Basophils Absolute: 0 10*3/uL (ref 0.0–0.1)
Basophils Relative: 1 %
Eosinophils Absolute: 0.2 10*3/uL (ref 0.0–0.5)
Eosinophils Relative: 2 %
HCT: 24.6 % — ABNORMAL LOW (ref 39.0–52.0)
Hemoglobin: 7.6 g/dL — ABNORMAL LOW (ref 13.0–17.0)
Immature Granulocytes: 1 %
Lymphocytes Relative: 18 %
Lymphs Abs: 1.3 10*3/uL (ref 0.7–4.0)
MCH: 25.7 pg — ABNORMAL LOW (ref 26.0–34.0)
MCHC: 30.9 g/dL (ref 30.0–36.0)
MCV: 83.1 fL (ref 80.0–100.0)
Monocytes Absolute: 0.4 10*3/uL (ref 0.1–1.0)
Monocytes Relative: 5 %
Neutro Abs: 5.4 10*3/uL (ref 1.7–7.7)
Neutrophils Relative %: 73 %
Platelet Count: 382 10*3/uL (ref 150–400)
RBC: 2.96 MIL/uL — ABNORMAL LOW (ref 4.22–5.81)
RDW: 22.7 % — ABNORMAL HIGH (ref 11.5–15.5)
Smear Review: NORMAL
WBC Count: 7.4 10*3/uL (ref 4.0–10.5)
nRBC: 0.3 % — ABNORMAL HIGH (ref 0.0–0.2)

## 2023-09-01 LAB — CMP (CANCER CENTER ONLY)
ALT: 14 U/L (ref 0–44)
AST: 9 U/L — ABNORMAL LOW (ref 15–41)
Albumin: 3.8 g/dL (ref 3.5–5.0)
Alkaline Phosphatase: 76 U/L (ref 38–126)
Anion gap: 8 (ref 5–15)
BUN: 9 mg/dL (ref 8–23)
CO2: 28 mmol/L (ref 22–32)
Calcium: 9.3 mg/dL (ref 8.9–10.3)
Chloride: 101 mmol/L (ref 98–111)
Creatinine: 0.69 mg/dL (ref 0.61–1.24)
GFR, Estimated: >60 mL/min (ref >60)
Glucose, Bld: 161 mg/dL — ABNORMAL HIGH (ref 70–99)
Potassium: 3.2 mmol/L — ABNORMAL LOW (ref 3.5–5.1)
Sodium: 137 mmol/L (ref 135–145)
Total Bilirubin: 0.3 mg/dL (ref 0.0–1.2)
Total Protein: 6.8 g/dL (ref 6.5–8.1)

## 2023-09-01 MED ORDER — LORATADINE 10 MG PO TABS
10.0000 mg | ORAL_TABLET | Freq: Once | ORAL | Status: AC
  Administered 2023-09-01: 10 mg via ORAL
  Filled 2023-09-01: qty 1

## 2023-09-01 MED ORDER — HEPARIN SOD (PORK) LOCK FLUSH 100 UNIT/ML IV SOLN
500.0000 [IU] | Freq: Once | INTRAVENOUS | Status: AC | PRN
  Administered 2023-09-01: 500 [IU]

## 2023-09-01 MED ORDER — SODIUM CHLORIDE 0.9 % IV SOLN
300.0000 mg | Freq: Once | INTRAVENOUS | Status: AC
  Administered 2023-09-01: 300 mg via INTRAVENOUS
  Filled 2023-09-01: qty 300

## 2023-09-01 MED ORDER — SODIUM CHLORIDE 0.9% FLUSH
10.0000 mL | Freq: Once | INTRAVENOUS | Status: AC | PRN
  Administered 2023-09-01: 10 mL

## 2023-09-01 MED ORDER — SODIUM CHLORIDE 0.9 % IV SOLN: INTRAVENOUS | Status: DC

## 2023-09-01 MED ORDER — ACETAMINOPHEN 325 MG PO TABS
650.0000 mg | ORAL_TABLET | Freq: Once | ORAL | Status: AC
  Administered 2023-09-01: 650 mg via ORAL
  Filled 2023-09-01: qty 2

## 2023-09-01 NOTE — Progress Notes (Signed)
Patient declined post iron infusion observation.  Tolerated treatment well without incident.  VSS at discharge.  Ambulated to lobby.

## 2023-09-02 ENCOUNTER — Other Ambulatory Visit: Payer: Self-pay

## 2023-09-02 DIAGNOSIS — C8198 Hodgkin lymphoma, unspecified, lymph nodes of multiple sites: Secondary | ICD-10-CM

## 2023-09-04 ENCOUNTER — Inpatient Hospital Stay

## 2023-09-04 ENCOUNTER — Encounter: Payer: Self-pay | Admitting: Hematology

## 2023-09-04 DIAGNOSIS — C8198 Hodgkin lymphoma, unspecified, lymph nodes of multiple sites: Secondary | ICD-10-CM

## 2023-09-04 DIAGNOSIS — Z5112 Encounter for antineoplastic immunotherapy: Secondary | ICD-10-CM | POA: Diagnosis not present

## 2023-09-04 LAB — CBC WITH DIFFERENTIAL (CANCER CENTER ONLY)
Abs Immature Granulocytes: 0.58 10*3/uL — ABNORMAL HIGH (ref 0.00–0.07)
Basophils Absolute: 0.1 10*3/uL (ref 0.0–0.1)
Basophils Relative: 1 %
Eosinophils Absolute: 0.2 10*3/uL (ref 0.0–0.5)
Eosinophils Relative: 1 %
HCT: 29.1 % — ABNORMAL LOW (ref 39.0–52.0)
Hemoglobin: 8.7 g/dL — ABNORMAL LOW (ref 13.0–17.0)
Immature Granulocytes: 5 %
Lymphocytes Relative: 16 %
Lymphs Abs: 1.9 10*3/uL (ref 0.7–4.0)
MCH: 26 pg (ref 26.0–34.0)
MCHC: 29.9 g/dL — ABNORMAL LOW (ref 30.0–36.0)
MCV: 86.9 fL (ref 80.0–100.0)
Monocytes Absolute: 1 10*3/uL (ref 0.1–1.0)
Monocytes Relative: 8 %
Neutro Abs: 8.2 10*3/uL — ABNORMAL HIGH (ref 1.7–7.7)
Neutrophils Relative %: 69 %
Platelet Count: 335 10*3/uL (ref 150–400)
RBC: 3.35 MIL/uL — ABNORMAL LOW (ref 4.22–5.81)
RDW: 25.3 % — ABNORMAL HIGH (ref 11.5–15.5)
WBC Count: 11.8 10*3/uL — ABNORMAL HIGH (ref 4.0–10.5)
nRBC: 1 % — ABNORMAL HIGH (ref 0.0–0.2)

## 2023-09-04 LAB — SAMPLE TO BLOOD BANK

## 2023-09-05 MED FILL — Fosaprepitant Dimeglumine For IV Infusion 150 MG (Base Eq): INTRAVENOUS | Qty: 5 | Status: AC

## 2023-09-06 ENCOUNTER — Encounter

## 2023-09-08 ENCOUNTER — Inpatient Hospital Stay

## 2023-09-08 ENCOUNTER — Inpatient Hospital Stay (HOSPITAL_BASED_OUTPATIENT_CLINIC_OR_DEPARTMENT_OTHER): Admitting: Hematology

## 2023-09-08 VITALS — HR 96

## 2023-09-08 VITALS — BP 126/77 | HR 102 | Temp 97.7°F | Resp 18 | Ht 69.0 in | Wt 152.4 lb

## 2023-09-08 DIAGNOSIS — Z5111 Encounter for antineoplastic chemotherapy: Secondary | ICD-10-CM

## 2023-09-08 DIAGNOSIS — C8178 Other classical Hodgkin lymphoma, lymph nodes of multiple sites: Secondary | ICD-10-CM

## 2023-09-08 DIAGNOSIS — D500 Iron deficiency anemia secondary to blood loss (chronic): Secondary | ICD-10-CM

## 2023-09-08 DIAGNOSIS — Z95828 Presence of other vascular implants and grafts: Secondary | ICD-10-CM

## 2023-09-08 DIAGNOSIS — Z5112 Encounter for antineoplastic immunotherapy: Secondary | ICD-10-CM | POA: Diagnosis not present

## 2023-09-08 DIAGNOSIS — K922 Gastrointestinal hemorrhage, unspecified: Secondary | ICD-10-CM

## 2023-09-08 LAB — CBC WITH DIFFERENTIAL (CANCER CENTER ONLY)
Abs Immature Granulocytes: 0.07 10*3/uL (ref 0.00–0.07)
Basophils Absolute: 0.1 10*3/uL (ref 0.0–0.1)
Basophils Relative: 1 %
Eosinophils Absolute: 0.3 10*3/uL (ref 0.0–0.5)
Eosinophils Relative: 2 %
HCT: 27.4 % — ABNORMAL LOW (ref 39.0–52.0)
Hemoglobin: 8.4 g/dL — ABNORMAL LOW (ref 13.0–17.0)
Immature Granulocytes: 1 %
Lymphocytes Relative: 11 %
Lymphs Abs: 1.2 10*3/uL (ref 0.7–4.0)
MCH: 26.1 pg (ref 26.0–34.0)
MCHC: 30.7 g/dL (ref 30.0–36.0)
MCV: 85.1 fL (ref 80.0–100.0)
Monocytes Absolute: 0.6 10*3/uL (ref 0.1–1.0)
Monocytes Relative: 6 %
Neutro Abs: 8.7 10*3/uL — ABNORMAL HIGH (ref 1.7–7.7)
Neutrophils Relative %: 79 %
Platelet Count: 343 10*3/uL (ref 150–400)
RBC: 3.22 MIL/uL — ABNORMAL LOW (ref 4.22–5.81)
RDW: 24.6 % — ABNORMAL HIGH (ref 11.5–15.5)
WBC Count: 10.9 10*3/uL — ABNORMAL HIGH (ref 4.0–10.5)
nRBC: 0 % (ref 0.0–0.2)

## 2023-09-08 LAB — CMP (CANCER CENTER ONLY)
ALT: 18 U/L (ref 0–44)
AST: 15 U/L (ref 15–41)
Albumin: 4 g/dL (ref 3.5–5.0)
Alkaline Phosphatase: 77 U/L (ref 38–126)
Anion gap: 8 (ref 5–15)
BUN: 9 mg/dL (ref 8–23)
CO2: 27 mmol/L (ref 22–32)
Calcium: 9.4 mg/dL (ref 8.9–10.3)
Chloride: 102 mmol/L (ref 98–111)
Creatinine: 0.81 mg/dL (ref 0.61–1.24)
GFR, Estimated: >60 mL/min (ref >60)
Glucose, Bld: 131 mg/dL — ABNORMAL HIGH (ref 70–99)
Potassium: 3.3 mmol/L — ABNORMAL LOW (ref 3.5–5.1)
Sodium: 137 mmol/L (ref 135–145)
Total Bilirubin: 0.3 mg/dL (ref 0.0–1.2)
Total Protein: 7.3 g/dL (ref 6.5–8.1)

## 2023-09-08 MED ORDER — DOXORUBICIN HCL CHEMO IV INJECTION 2 MG/ML
25.0000 mg/m2 | Freq: Once | INTRAVENOUS | Status: AC
  Administered 2023-09-08: 46 mg via INTRAVENOUS
  Filled 2023-09-08: qty 23

## 2023-09-08 MED ORDER — DEXAMETHASONE SODIUM PHOSPHATE 10 MG/ML IJ SOLN
10.0000 mg | Freq: Once | INTRAMUSCULAR | Status: AC
  Administered 2023-09-08: 10 mg via INTRAVENOUS
  Filled 2023-09-08: qty 1

## 2023-09-08 MED ORDER — SODIUM CHLORIDE 0.9% FLUSH
10.0000 mL | Freq: Once | INTRAVENOUS | Status: AC | PRN
  Administered 2023-09-08: 10 mL

## 2023-09-08 MED ORDER — DIPHENHYDRAMINE HCL 50 MG/ML IJ SOLN
50.0000 mg | Freq: Once | INTRAMUSCULAR | Status: AC
  Administered 2023-09-08: 50 mg via INTRAVENOUS
  Filled 2023-09-08: qty 1

## 2023-09-08 MED ORDER — SODIUM CHLORIDE 0.9 % IV SOLN
150.0000 mg | Freq: Once | INTRAVENOUS | Status: AC
  Administered 2023-09-08: 150 mg via INTRAVENOUS
  Filled 2023-09-08: qty 150

## 2023-09-08 MED ORDER — HEPARIN SOD (PORK) LOCK FLUSH 100 UNIT/ML IV SOLN
500.0000 [IU] | Freq: Once | INTRAVENOUS | Status: AC | PRN
  Administered 2023-09-08: 500 [IU]

## 2023-09-08 MED ORDER — SODIUM CHLORIDE 0.9 % IV SOLN
375.0000 mg/m2 | Freq: Once | INTRAVENOUS | Status: AC
  Administered 2023-09-08: 680 mg via INTRAVENOUS
  Filled 2023-09-08: qty 68

## 2023-09-08 MED ORDER — SODIUM CHLORIDE 0.9% FLUSH
10.0000 mL | INTRAVENOUS | Status: DC | PRN
  Administered 2023-09-08: 10 mL

## 2023-09-08 MED ORDER — ACETAMINOPHEN 325 MG PO TABS
650.0000 mg | ORAL_TABLET | Freq: Once | ORAL | Status: AC
  Administered 2023-09-08: 650 mg via ORAL
  Filled 2023-09-08: qty 2

## 2023-09-08 MED ORDER — SODIUM CHLORIDE 0.9 % IV SOLN
1.0000 mg/kg | Freq: Once | INTRAVENOUS | Status: AC
  Administered 2023-09-08: 70 mg via INTRAVENOUS
  Filled 2023-09-08: qty 14

## 2023-09-08 MED ORDER — SODIUM CHLORIDE 0.9 % IV SOLN: INTRAVENOUS | Status: DC

## 2023-09-08 MED ORDER — VINBLASTINE SULFATE CHEMO INJECTION 1 MG/ML
6.0000 mg/m2 | Freq: Once | INTRAVENOUS | Status: AC
  Administered 2023-09-08: 10.9 mg via INTRAVENOUS
  Filled 2023-09-08: qty 10.9

## 2023-09-08 MED ORDER — PALONOSETRON HCL INJECTION 0.25 MG/5ML
0.2500 mg | Freq: Once | INTRAVENOUS | Status: AC
  Administered 2023-09-08: 0.25 mg via INTRAVENOUS
  Filled 2023-09-08: qty 5

## 2023-09-08 NOTE — Patient Instructions (Signed)
 [facility] - A DEPT OF [facility]  Discharge Instructions: Thank you for choosing [facility] to provide your oncology and hematology care.   If you have a lab appointment with the Cancer Center, please go directly to the Cancer Center and check in at the registration area.   Wear comfortable clothing and clothing appropriate for easy access to any Portacath or PICC line.   We strive to give you quality time with your provider. You may need to reschedule your appointment if you arrive late (15 or more minutes).  Arriving late affects you and other patients whose appointments are after yours.  Also, if you miss three or more appointments without notifying the office, you may be dismissed from the clinic at the provider's discretion.      For prescription refill requests, have your pharmacy contact our office and allow 72 hours for refills to be completed.    Today you received the following chemotherapy and/or immunotherapy agents: Doxorubicin , Vinblastine , Dacarbazine , Brentuximab      To help prevent nausea and vomiting after your treatment, we encourage you to take your nausea medication as directed.  BELOW ARE SYMPTOMS THAT SHOULD BE REPORTED IMMEDIATELY: *FEVER GREATER THAN 100.4 F (38 C) OR HIGHER *CHILLS OR SWEATING *NAUSEA AND VOMITING THAT IS NOT CONTROLLED WITH YOUR NAUSEA MEDICATION *UNUSUAL SHORTNESS OF BREATH *UNUSUAL BRUISING OR BLEEDING *URINARY PROBLEMS (pain or burning when urinating, or frequent urination) *BOWEL PROBLEMS (unusual diarrhea, constipation, pain near the anus) TENDERNESS IN MOUTH AND THROAT WITH OR WITHOUT PRESENCE OF ULCERS (sore throat, sores in mouth, or a toothache) UNUSUAL RASH, SWELLING OR PAIN  UNUSUAL VAGINAL DISCHARGE OR ITCHING   Items with * indicate a potential emergency and should be followed up as soon as possible or go to the Emergency Department if any problems should occur.  Please show the  CHEMOTHERAPY ALERT CARD or IMMUNOTHERAPY ALERT CARD at check-in to the Emergency Department and triage nurse.  Should you have questions after your visit or need to cancel or reschedule your appointment, please contact [facility] - A DEPT [facility]  Dept: [phone]  and follow the prompts.  Office hours are 8:00 a.m. to 4:30 p.m. Monday - Friday. Please note that voicemails left after 4:00 p.m. may not be returned until the following business day.  We are closed weekends and major holidays. You have access to a nurse at all times for urgent questions. Please call the main number to the clinic Dept: [phone] and follow the prompts.   For any non-urgent questions, you may also contact your provider using MyChart. We now offer e-Visits for anyone 78 and older to request care online for non-urgent symptoms. For details visit [email or web address].   Also download the MyChart app! Go to the app store, search "MyChart", open the app, select [place], and log in with your MyChart username and password.

## 2023-09-08 NOTE — Progress Notes (Signed)
 Patient seen by Dr. Minnie Amber are within treatment parameters.  Labs reviewed: and are within treatment parameters. CMP precessing  Per physician team, patient is ready for treatment. Please note that modifications are being made to the treatment plan including    Dr Salomon Cree has made dose modifications to Adcentris(lower dose)

## 2023-09-08 NOTE — Progress Notes (Signed)
 Nutrition Follow-up:  Patient with stage IV, hodgkin lymphoma (gray zone lymphoma).  Patient receiving adrimaycin, dacarbacine, vinblastine   Met with patient during infusion.  Reports that his appetite is up and down.  Continue to drink 350 calorie shake 2-3 times a day.  Wife made him smoothie recently (chocolate/peanut butter shake).  Breakfast is usually breakfast meats, eggs, grits, toast.  Snacks during the day on trail mix, bars, banana.  Dinner is whatever wife prepares (chicken, vegetables)    Medications: not taking remeron  due to making him sleepy during the day  Labs: reviewed  Anthropometrics:   Weight 152 lb 6.4 oz on 4/28 146 lb 6.4 oz on 3/31 160 lb on 1/29   NUTRITION DIAGNOSIS: Inadequate oral intake stable   INTERVENTION:  Continue strategies to increase calories and protein Continue high calorie shakes    MONITORING, EVALUATION, GOAL: Weight trends, intake   NEXT VISIT: Tuesday, May 27 during infusion  Alexandar Weisenberger B. Zollie Hipp, CSO, LDN Registered Dietitian [phone]

## 2023-09-08 NOTE — Progress Notes (Signed)
 HEMATOLOGY/ONCOLOGY CLINIC NOTE  Date of Service: 09/08/23    Patient Care Team: Patient, No Pcp Per as PCP - General (General Practice)  CHIEF COMPLAINTS/PURPOSE OF CONSULTATION:  Follow-up for continued evaluation and mx of recently diagnosed Hodgkins lymphoma  HISTORY OF PRESENTING ILLNESS:   Timothy Hunter is a wonderful 63 y.o. male who has been referred to us  by Dr. 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   Patient presented to the ED on 03/09/2023 for GI bleed/acute blood loss anemia/microcytic anemia/low vitamin B12 levels and was seen by Dr. Lee Public.   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   Today, he is accompanied by his wife. He reports that he was recently seen in the hospital for anemia, with abdominal pain and black stools. His hgb was in 6s at the time. Patient received a CT scan of the abdomen and chest and was found to have several enlarged lymph glands in the center of his chest, including lung nodules and findings in the upper abdomen, bone, and thoracic spine. A biopsy of lymph node in retroperitoneum did show concerns for lymphoma.  Patient reports that he has otherwise been generally been pretty healthy otherwise. He notes taking medication for arthritis and reports a medical history of glaucoma. He denies any other chronic medical issues and does not take any medications on a regular basis otherwise. Patient denies having any surgeries in the past.   He reports that his lower back was evaluated earlier this year due to lower back pain, and there were not any concerning findings at that time. He denies any pain in the middle back. His back pain has  improved recently. His wife reports that his energy level has improved recently.   Patient does not smoke cigarettes at this time. He previously smoked 2 packs a day since he was 63 years old until about 2010. He reports that he rarely consumes alcohol and only consumes alcohol with social use. Patient does smoke marijuana.  There is a fhx of cancer. His wife reports that he has had family members that have passed away from cancer, but she is unsure of the type of cancer they had. His wife reports that patient's younger and older sister do have a history of cancer, and are in remission at this time. His older sister has breast cancer and breast cancer does run in the family. Patient denies any other medical issues in the family. He denies any blood disorders in the family.   Patient reports that he has not endorsed any black stools since being home from the hospital, and denies any other changes in bowel habits.   Patient's wife reports that patient did receive a blood transfusion while in the hospital, as well as potassium, but does not believe that patient received IV iron . His B12 level was also low at that time. He is not on any iron  or B12 replacement at this time.   He denies having any other symptoms at this time. Patient denies any lightheadedness/dizziness, SOB, chest pain, abdominal pain, or new leg swelling.  His wife reports that he did take one Tramadol  for back pain prevention. He did not have any back pain prior to taking it.   He reports that he has lost  12 pounds over the course of about 3 months. Patient reports that his eating habits are normal at this time. He does drink two Ensure protein drinks a day and has gained 5-6 pounds recently. He reports that he was not put on any steroids recently.   Patient denies new lumps/bumps. His wife reports that patient has a scar on right forehead, which has been present for a while. He denies any swelling in the groin or testicular  pain/swelling.   INTERVAL HISTORY  Timothy Hunter is a 63 y.o. male here for continued evaluation and management of Hodgkins lymphoma. He is here to receive cycle 5 day 15 of A-AVD chemotherapy.  Patient was last seen by me on 08/22/2023 and he complained of mild SOB, occasional lightheadedness/dizziness, and mild tingling/numbness bilateral hands.  Patient notes he has been doing well overall since our last visit. He has been tolerating his current treatment with mild toxicities of persistent tingling sensation on top of his fingers and intermittent bilateral leg cramps.  He also complains of mild fatigue and occasional intermittent back pain, which is controlled with tramadol . He denies any new infection issues, fever, chills, night sweats, unexpected weight loss, chest pain, abdominal pain, SOB, abnormal bowel movement, or leg swelling.   Patient tolerated IV Iron  infusion well without any new or severe toxicities.   MEDICAL HISTORY:  Past Medical History:  Diagnosis Date   Dyspnea    Glaucoma associated with anomalies of iris     SURGICAL HISTORY: Past Surgical History:  Procedure Laterality Date   BIOPSY  03/11/2023   Procedure: BIOPSY;  Surgeon: Genell Ken, MD;  Location: [facility];  Service: Gastroenterology;;   ESOPHAGOGASTRODUODENOSCOPY (EGD) WITH PROPOFOL  N/A 03/11/2023   Procedure: ESOPHAGOGASTRODUODENOSCOPY (EGD) WITH PROPOFOL ;  Surgeon: Genell Ken, MD;  Location: [facility];  Service: Gastroenterology;  Laterality: N/A;   HOT HEMOSTASIS N/A 03/11/2023   Procedure: HOT HEMOSTASIS (ARGON PLASMA COAGULATION/BICAP);  Surgeon: Genell Ken, MD;  Location: [facility];  Service: Gastroenterology;  Laterality: N/A;   IR IMAGING GUIDED PORT INSERTION  03/26/2023    SOCIAL HISTORY: Social History   Socioeconomic History   Marital status: Married    Spouse name: Not on file   Number of children: Not on file   Years of education: Not on file   Highest education  level: Not on file  Occupational History   Not on file  Tobacco Use   Smoking status: Former    Passive exposure: Never   Smokeless tobacco: Not on file  Vaping Use   Vaping status: Never Used  Substance and Sexual Activity   Alcohol use: Yes    Comment: occasionally   Drug use: Yes    Types: Marijuana   Sexual activity: Not on file  Other Topics Concern   Not on file  Social History Narrative   Not on file   Social Drivers of Health   Financial Resource Strain: Not on file  Food Insecurity: No Food Insecurity (03/09/2023)   Hunger Vital Sign    Worried About Running Out of Food in the Last Year: Never true    Ran Out of Food in the Last Year: Never true  Transportation Needs: No Transportation Needs (03/09/2023)   PRAPARE - Administrator, Civil Service (Medical): No    Lack of Transportation (Non-Medical): No  Physical Activity: Not on file  Stress: Not on file  Social Connections: Not on file  Intimate Partner Violence: Not At Risk (03/09/2023)  Humiliation, Afraid, Rape, and Kick questionnaire    Fear of Current or Ex-Partner: No    Emotionally Abused: No    Physically Abused: No    Sexually Abused: No    FAMILY HISTORY: No family history on file.  ALLERGIES:  has no known allergies.  MEDICATIONS:  Current Outpatient Medications  Medication Sig Dispense Refill   calcium-vitamin D (OSCAL WITH D) 500-5 MG-MCG tablet Take 1 tablet by mouth daily.     clotrimazole -betamethasone  (LOTRISONE ) cream Apply 1 Application topically 2 (two) times daily. To area of rash on shoulder and back. 30 g 1   Cyanocobalamin  (VITAMIN B-12 PO) Take 1 tablet by mouth daily.     dexamethasone  (DECADRON ) 4 MG tablet Take 2 tablets by mouth once a day for 3 days after chemotherapy. Take with food. 30 tablet 1   feeding supplement (ENSURE ENLIVE / ENSURE PLUS) LIQD Take 237 mLs by mouth 2 (two) times daily between meals. 237 mL 12   HYDROcodone -acetaminophen  (NORCO/VICODIN)  5-325 MG tablet Take 1 tablet by mouth every 6 (six) hours as needed for severe pain (pain score 7-10). (Patient not taking: Reported on 06/30/2023) 5 tablet 0   latanoprost  (XALATAN ) 0.005 % ophthalmic solution Place 1 drop into both eyes at bedtime.     lidocaine -prilocaine  (EMLA ) cream Apply to affected area once 30 g 3   mirtazapine  (REMERON ) 15 MG tablet Take 1 tablet (15 mg total) by mouth at bedtime. 30 tablet 1   ondansetron  (ZOFRAN ) 8 MG tablet Take 1 tablet (8 mg total) by mouth every 8 (eight) hours as needed for nausea or vomiting. Start on the third day after chemotherapy. 30 tablet 1   pantoprazole  (PROTONIX ) 40 MG tablet Take 1 tablet (40 mg total) by mouth 2 (two) times daily before a meal. 60 tablet 2   prochlorperazine  (COMPAZINE ) 10 MG tablet Take 1 tablet (10 mg total) by mouth every 6 (six) hours as needed for nausea or vomiting. 30 tablet 1   timolol  (TIMOPTIC ) 0.5 % ophthalmic solution Place 1 drop into both eyes in the morning.     traMADol  (ULTRAM ) 50 MG tablet Take 1 tablet (50 mg total) by mouth every 6 (six) hours as needed. 30 tablet 0   No current facility-administered medications for this visit.    REVIEW OF SYSTEMS:    10 Point review of Systems was done is negative except as noted above.   PHYSICAL EXAMINATION: ECOG PERFORMANCE STATUS: 2 - Symptomatic, <50% confined to bed .BP 126/77 (BP Location: Left Arm, Patient Position: Sitting)   Pulse (!) 102   Temp 97.7 F (36.5 C)   Resp 18   Ht 5\' 9"  (1.753 m)   Wt 152 lb 6.4 oz (69.1 kg)   BMI 22.51 kg/m   GENERAL:alert, in no acute distress and comfortable SKIN: no acute rashes, no significant lesions EYES: conjunctiva are pink and non-injected, sclera anicteric OROPHARYNX: MMM, no exudates, no oropharyngeal erythema or ulceration NECK: supple, no JVD LYMPH:  no palpable lymphadenopathy in the cervical, axillary or inguinal regions LUNGS: clear to auscultation b/l with normal respiratory effort HEART:  regular rate & rhythm ABDOMEN:  normoactive bowel sounds , non tender, not distended. Extremity: no pedal edema PSYCH: alert & oriented x 3 with fluent speech NEURO: no focal motor/sensory deficits   LABORATORY DATA:  I have reviewed the data as listed  .    Latest Ref Rng & Units 09/04/2023    9:44 AM 09/01/2023   12:48 PM 08/22/2023  9:36 AM  CBC  WBC 4.0 - 10.5 K/uL 11.8  7.4  12.2   Hemoglobin 13.0 - 17.0 g/dL 8.7  7.6  7.9   Hematocrit 39.0 - 52.0 % 29.1  24.6  26.2   Platelets 150 - 400 K/uL 335  382  320     .    Latest Ref Rng & Units 09/08/2023   11:47 AM 09/01/2023   12:48 PM 08/22/2023    9:36 AM  CMP  Glucose 70 - 99 mg/dL 161  096  045   BUN 8 - 23 mg/dL 9  9  8    Creatinine 0.61 - 1.24 mg/dL 4.09  8.11  9.14   Sodium 135 - 145 mmol/L 137  137  135   Potassium 3.5 - 5.1 mmol/L 3.3  3.2  3.5   Chloride 98 - 111 mmol/L 102  101  100   CO2 22 - 32 mmol/L 27  28  27    Calcium 8.9 - 10.3 mg/dL 9.4  9.3  9.2   Total Protein 6.5 - 8.1 g/dL 7.3  6.8  7.1   Total Bilirubin 0.0 - 1.2 mg/dL 0.3  0.3  0.3   Alkaline Phos 38 - 126 U/L 77  76  83   AST 15 - 41 U/L 15  9  13    ALT 0 - 44 U/L 18  14  20     04/22/2023 right supraclavicular lymph node biopsy:         03/11/2023 biopsy of left retroperitoneal lymph node:     RADIOGRAPHIC STUDIES: I have personally reviewed the radiological images as listed and agreed with the findings in the report. No results found.    ASSESSMENT & PLAN:   63 y.o. 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 3. Hx of esophageal candidiasis 4.Back pain due to involvement by lymphoma -- pain now resolved. 5.  Protein calorie malnutrition-improving with lymphoma treatment has gained 7 pounds since his last visit . Wt Readings from Last 3 Encounters:  08/25/23 155 lb (70.3 kg)  08/22/23 154 lb 9.6 oz (70.1 kg)  08/11/23 146 lb 6.4 oz  (66.4 kg)    PLAN: -Discussed lab results from today, 09/08/2023, in detail with the patient. CBC shows elevated WBC of 10/9 K, low Hgb of 8.4 g/dL with low Hct of 78.2%. 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 Pt agrees.  -patient has been tolerating his current treatment well with mild toxicities of grade 1 neuropathy. -patient can proceed with cycle 5 day 15 his A-AVD treatment today with modified dosage. Cutting down ADCETRIS  dosage.  -Answered all of patient's questions. -continue vitamin B complex and B12 for nerve protection.   FOLLOW-UP: Per integrated scheduling  The total time spent in the appointment was 30 minutes* .  All of the patient's questions were answered with apparent satisfaction. The patient knows to call the clinic with any problems, questions or concerns.   Jacquelyn Matt MD MS AAHIVMS [facility] Hematology/Oncology Physician [facility]  .*Total Encounter Time as defined by the Centers for Medicare and Medicaid Services includes, in addition to the face-to-face time of a patient visit (documented in the note above) non-face-to-face time: obtaining and reviewing outside history, ordering and reviewing medications, tests or procedures, care coordination (communications with other health care professionals  or caregivers) and documentation in the medical record.   I,Param Shah,acting as a Neurosurgeon for Jacquelyn Matt, MD.,have documented all relevant documentation on the behalf of Jacquelyn Matt, MD,as directed by  Jacquelyn Matt, MD while in the presence of Jacquelyn Matt, MD.  .I have reviewed the above documentation for accuracy and completeness, and I agree with the above. .Meridith Romick Kishore Gurshan Settlemire MD

## 2023-09-10 ENCOUNTER — Inpatient Hospital Stay

## 2023-09-10 VITALS — BP 125/70 | HR 88 | Temp 98.6°F | Resp 16

## 2023-09-10 DIAGNOSIS — Z5112 Encounter for antineoplastic immunotherapy: Secondary | ICD-10-CM | POA: Diagnosis not present

## 2023-09-10 DIAGNOSIS — C8178 Other classical Hodgkin lymphoma, lymph nodes of multiple sites: Secondary | ICD-10-CM

## 2023-09-10 MED ORDER — PEGFILGRASTIM-JMDB 6 MG/0.6ML ~~LOC~~ SOSY
6.0000 mg | PREFILLED_SYRINGE | Freq: Once | SUBCUTANEOUS | Status: AC
  Administered 2023-09-10: 6 mg via SUBCUTANEOUS
  Filled 2023-09-10: qty 0.6

## 2023-09-15 ENCOUNTER — Encounter: Payer: Self-pay | Admitting: Hematology

## 2023-09-18 ENCOUNTER — Other Ambulatory Visit: Payer: Self-pay

## 2023-09-18 DIAGNOSIS — C8178 Other classical Hodgkin lymphoma, lymph nodes of multiple sites: Secondary | ICD-10-CM

## 2023-09-19 ENCOUNTER — Encounter: Payer: Self-pay | Admitting: Hematology

## 2023-09-19 MED ORDER — TRAMADOL HCL 50 MG PO TABS: 50.0000 mg | ORAL_TABLET | Freq: Four times a day (QID) | ORAL | 0 refills | Status: DC | PRN

## 2023-09-19 MED ORDER — DEXAMETHASONE 4 MG PO TABS: ORAL_TABLET | ORAL | 1 refills | Status: DC

## 2023-09-19 MED FILL — Fosaprepitant Dimeglumine For IV Infusion 150 MG (Base Eq): INTRAVENOUS | Qty: 5 | Status: AC

## 2023-09-21 NOTE — Progress Notes (Signed)
 HEMATOLOGY/ONCOLOGY CLINIC NOTE  Date of Service: 09/22/2023   Patient Care Team: Patient, No Pcp Per as PCP - General (General Practice)  CHIEF COMPLAINTS/PURPOSE OF CONSULTATION:  Follow-up for continued evaluation and mx of recently diagnosed Hodgkins lymphoma  HISTORY OF PRESENTING ILLNESS:   Timothy Hunter is a wonderful 63 y.o. male who has been referred to us  by Dr. 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   Patient presented to the ED on 03/09/2023 for GI bleed/acute blood loss anemia/microcytic anemia/low vitamin B12 levels and was seen by Dr. Lee Public.   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   Today, he is accompanied by his wife. He reports that he was recently seen in the hospital for anemia, with abdominal pain and black stools. His hgb was in 6s at the time. Patient received a CT scan of the abdomen and chest and was found to have several enlarged lymph glands in the center of his chest, including lung nodules and findings in the upper abdomen, bone, and thoracic spine. A biopsy of lymph node in retroperitoneum did show concerns for lymphoma.  Patient reports that he has otherwise been generally been pretty healthy otherwise. He notes taking medication for arthritis and reports a medical history of glaucoma. He denies any other chronic medical issues and does not take any medications on a regular basis otherwise. Patient denies having any surgeries in the past.   He reports that his lower back was evaluated earlier this year due to lower back pain, and there were not any concerning findings at that time. He denies any pain in the middle back. His back pain has  improved recently. His wife reports that his energy level has improved recently.   Patient does not smoke cigarettes at this time. He previously smoked 2 packs a day since he was 63 years old until about 2010. He reports that he rarely consumes alcohol and only consumes alcohol with social use. Patient does smoke marijuana.  There is a fhx of cancer. His wife reports that he has had family members that have passed away from cancer, but she is unsure of the type of cancer they had. His wife reports that patient's younger and older sister do have a history of cancer, and are in remission at this time. His older sister has breast cancer and breast cancer does run in the family. Patient denies any other medical issues in the family. He denies any blood disorders in the family.   Patient reports that he has not endorsed any black stools since being home from the hospital, and denies any other changes in bowel habits.   Patient's wife reports that patient did receive a blood transfusion while in the hospital, as well as potassium, but does not believe that patient received IV iron . His B12 level was also low at that time. He is not on any iron  or B12 replacement at this time.   He denies having any other symptoms at this time. Patient denies any lightheadedness/dizziness, SOB, chest pain, abdominal pain, or new leg swelling.  His wife reports that he did take one Tramadol  for back pain prevention. He did not have any back pain prior to taking it.   He reports that he has lost 12  pounds over the course of about 3 months. Patient reports that his eating habits are normal at this time. He does drink two Ensure protein drinks a day and has gained 5-6 pounds recently. He reports that he was not put on any steroids recently.   Patient denies new lumps/bumps. His wife reports that patient has a scar on right forehead, which has been present for a while. He denies any swelling in the groin or testicular  pain/swelling.   INTERVAL HISTORY  Timothy Hunter is a 63 y.o. male here for continued evaluation and management of Hodgkins lymphoma. He is here to receive cycle 6 day 1 of A-AVD chemotherapy.  Patient was last seen by me on 09/08/2023 and reported persistent tingling sensation on top of his fingers, intermittent bilateral leg cramps, mild fatigue, and occasional intermittent back pain.  Patient today notes that he continues to have neuropathic symptoms that are persistent and more bothersome especially in his feet and fingers.  I discussed risks and benefits.  Since his symptoms continue to be persistent despite reducing the dose of Adcetris  we shall hold this for his last on 6 cycle of treatment.  Patient is agreeable with this. No fevers no chills no night sweats. Notes some fatigue and continues to have some treatment related anemia.  Notes no overt evidence of GI bleeding.  No black stools, bright red blood in the stools.  Good p.o. intake and p.o. fluid intake.   MEDICAL HISTORY:  Past Medical History:  Diagnosis Date   Dyspnea    Glaucoma associated with anomalies of iris     SURGICAL HISTORY: Past Surgical History:  Procedure Laterality Date   BIOPSY  03/11/2023   Procedure: BIOPSY;  Surgeon: Genell Ken, MD;  Location: [facility];  Service: Gastroenterology;;   ESOPHAGOGASTRODUODENOSCOPY (EGD) WITH PROPOFOL  N/A 03/11/2023   Procedure: ESOPHAGOGASTRODUODENOSCOPY (EGD) WITH PROPOFOL ;  Surgeon: Genell Ken, MD;  Location: [facility];  Service: Gastroenterology;  Laterality: N/A;   HOT HEMOSTASIS N/A 03/11/2023   Procedure: HOT HEMOSTASIS (ARGON PLASMA COAGULATION/BICAP);  Surgeon: Genell Ken, MD;  Location: [facility];  Service: Gastroenterology;  Laterality: N/A;   IR IMAGING GUIDED PORT INSERTION  03/26/2023    SOCIAL HISTORY: Social History   Socioeconomic History   Marital status: Married    Spouse name: Not on file   Number of children: Not on file    Years of education: Not on file   Highest education level: Not on file  Occupational History   Not on file  Tobacco Use   Smoking status: Former    Passive exposure: Never   Smokeless tobacco: Not on file  Vaping Use   Vaping status: Never Used  Substance and Sexual Activity   Alcohol use: Yes    Comment: occasionally   Drug use: Yes    Types: Marijuana   Sexual activity: Not on file  Other Topics Concern   Not on file  Social History Narrative   Not on file   Social Drivers of Health   Financial Resource Strain: Not on file  Food Insecurity: No Food Insecurity (03/09/2023)   Hunger Vital Sign    Worried About Running Out of Food in the Last Year: Never true    Ran Out of Food in the Last Year: Never true  Transportation Needs: No Transportation Needs (03/09/2023)   PRAPARE - Administrator, Civil Service (Medical): No    Lack of Transportation (Non-Medical): No  Physical Activity: Not on file  Stress: Not on file  Social Connections: Not on file  Intimate Partner Violence: Not At Risk (03/09/2023)   Humiliation, Afraid, Rape, and Kick questionnaire    Fear of Current or Ex-Partner: No    Emotionally Abused: No    Physically Abused: No    Sexually Abused: No    FAMILY HISTORY: No family history on file.  ALLERGIES:  has no known allergies.  MEDICATIONS:  Current Outpatient Medications  Medication Sig Dispense Refill   calcium-vitamin D (OSCAL WITH D) 500-5 MG-MCG tablet Take 1 tablet by mouth daily.     clotrimazole -betamethasone  (LOTRISONE ) cream Apply 1 Application topically 2 (two) times daily. To area of rash on shoulder and back. 30 g 1   Cyanocobalamin  (VITAMIN B-12 PO) Take 1 tablet by mouth daily.     dexamethasone  (DECADRON ) 4 MG tablet Take 2 tablets by mouth once a day for 3 days after chemotherapy. Take with food. 30 tablet 1   feeding supplement (ENSURE ENLIVE / ENSURE PLUS) LIQD Take 237 mLs by mouth 2 (two) times daily between meals.  237 mL 12   HYDROcodone -acetaminophen  (NORCO/VICODIN) 5-325 MG tablet Take 1 tablet by mouth every 6 (six) hours as needed for severe pain (pain score 7-10). (Patient not taking: Reported on 06/30/2023) 5 tablet 0   latanoprost  (XALATAN ) 0.005 % ophthalmic solution Place 1 drop into both eyes at bedtime.     lidocaine -prilocaine  (EMLA ) cream Apply to affected area once 30 g 3   mirtazapine  (REMERON ) 15 MG tablet Take 1 tablet (15 mg total) by mouth at bedtime. 30 tablet 1   ondansetron  (ZOFRAN ) 8 MG tablet Take 1 tablet (8 mg total) by mouth every 8 (eight) hours as needed for nausea or vomiting. Start on the third day after chemotherapy. 30 tablet 1   pantoprazole  (PROTONIX ) 40 MG tablet Take 1 tablet (40 mg total) by mouth 2 (two) times daily before a meal. 60 tablet 2   prochlorperazine  (COMPAZINE ) 10 MG tablet Take 1 tablet (10 mg total) by mouth every 6 (six) hours as needed for nausea or vomiting. 30 tablet 1   timolol  (TIMOPTIC ) 0.5 % ophthalmic solution Place 1 drop into both eyes in the morning.     traMADol  (ULTRAM ) 50 MG tablet Take 1 tablet (50 mg total) by mouth every 6 (six) hours as needed. 30 tablet 0   No current facility-administered medications for this visit.    REVIEW OF SYSTEMS:    10 Point review of Systems was done is negative except as noted above.   PHYSICAL EXAMINATION: ECOG PERFORMANCE STATUS: 2 - Symptomatic, <50% confined to bed .BP 111/78   Pulse 93   Temp 98.1 F (36.7 C)   Resp 20   Wt 156 lb 1.6 oz (70.8 kg)   SpO2 100%   BMI 23.05 kg/m   GENERAL:alert, in no acute distress and comfortable SKIN: no acute rashes, no significant lesions EYES: conjunctiva are pink and non-injected, sclera anicteric OROPHARYNX: MMM, no exudates, no oropharyngeal erythema or ulceration NECK: supple, no JVD LYMPH:  no palpable lymphadenopathy in the cervical, axillary or inguinal regions LUNGS: clear to auscultation b/l with normal respiratory effort HEART: regular rate  & rhythm ABDOMEN:  normoactive bowel sounds , non tender, not distended. Extremity: no pedal edema PSYCH: alert & oriented x 3 with fluent speech NEURO: no focal motor/sensory deficits   LABORATORY DATA:  I have reviewed the data as listed  .    Latest Ref Rng & Units 09/22/2023  11:33 AM 09/08/2023   11:47 AM 09/04/2023    9:44 AM  CBC  WBC 4.0 - 10.5 K/uL 9.0  10.9  11.8   Hemoglobin 13.0 - 17.0 g/dL 8.1  8.4  8.7   Hematocrit 39.0 - 52.0 % 26.7  27.4  29.1   Platelets 150 - 400 K/uL 273  343  335     .    Latest Ref Rng & Units 09/22/2023   11:33 AM 09/08/2023   11:47 AM 09/01/2023   12:48 PM  CMP  Glucose 70 - 99 mg/dL 347  425  956   BUN 8 - 23 mg/dL 10  9  9    Creatinine 0.61 - 1.24 mg/dL 3.87  5.64  3.32   Sodium 135 - 145 mmol/L 137  137  137   Potassium 3.5 - 5.1 mmol/L 3.3  3.3  3.2   Chloride 98 - 111 mmol/L 103  102  101   CO2 22 - 32 mmol/L 26  27  28    Calcium 8.9 - 10.3 mg/dL 9.4  9.4  9.3   Total Protein 6.5 - 8.1 g/dL 7.2  7.3  6.8   Total Bilirubin 0.0 - 1.2 mg/dL 0.3  0.3  0.3   Alkaline Phos 38 - 126 U/L 73  77  76   AST 15 - 41 U/L 19  15  9    ALT 0 - 44 U/L 25  18  14     04/22/2023 right supraclavicular lymph node biopsy:         03/11/2023 biopsy of left retroperitoneal lymph node:     RADIOGRAPHIC STUDIES: I have personally reviewed the radiological images as listed and agreed with the findings in the report. No results found.    ASSESSMENT & PLAN:   63 y.o. 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 3. Hx of esophageal candidiasis 4.Back pain due to involvement by lymphoma -- pain now resolved. 5.  Protein calorie malnutrition-improving with lymphoma treatment has gained 7 pounds since his last visit . Wt Readings from Last 3 Encounters:  09/08/23 152 lb 6.4 oz (69.1 kg)  08/25/23 155 lb (70.3 kg)  08/22/23 154 lb 9.6 oz (70.1 kg)    6.  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 Does not note any overt evidence of GI bleeding. -Does not grade 1-2 neuropathy and after discussion of pros and cons the patient would prefer to hold off on Adcentris for his last cycle of treatment.  This has been discontinued -continue vitamin B complex and vitamin B12 - Patient was recommended to call us  immediately if he has any worsening fatigue lightheadedness or dizziness that might be an indication of worsening anemia and needing PRBC transfusion   FOLLOW-UP: Follow-up as per currently scheduled appointments for cycle 6-day 15  The total time spent in the appointment was 30 minutes* .  All of the patient's questions were answered with apparent satisfaction. The patient knows to call the clinic with any problems, questions or concerns.   Jacquelyn Matt MD MS AAHIVMS [facility] Hematology/Oncology Physician [facility]  .*Total Encounter Time as defined by the Centers for Medicare  and Medicaid Services includes, in addition to the face-to-face time of a patient visit (documented in the note above) non-face-to-face time: obtaining and reviewing outside history, ordering and reviewing medications, tests or procedures, care coordination (communications with other health care professionals or caregivers) and documentation in the medical record.    I,Mitra Faeizi,acting as a Neurosurgeon for Jacquelyn Matt, MD.,have documented all relevant documentation on the behalf of Jacquelyn Matt, MD,as directed by  Jacquelyn Matt, MD while in the presence of Jacquelyn Matt, MD.  .I have reviewed the above documentation for accuracy and completeness, and I agree with the above. .Gautam Kishore Kale MD

## 2023-09-22 ENCOUNTER — Inpatient Hospital Stay: Attending: Hematology

## 2023-09-22 ENCOUNTER — Inpatient Hospital Stay (HOSPITAL_BASED_OUTPATIENT_CLINIC_OR_DEPARTMENT_OTHER): Admitting: Hematology

## 2023-09-22 ENCOUNTER — Inpatient Hospital Stay

## 2023-09-22 VITALS — BP 111/78 | HR 93 | Temp 98.1°F | Resp 20 | Wt 156.1 lb

## 2023-09-22 VITALS — BP 118/81 | HR 91 | Resp 18

## 2023-09-22 DIAGNOSIS — Z79632 Long term (current) use of antitumor antibiotic: Secondary | ICD-10-CM | POA: Insufficient documentation

## 2023-09-22 DIAGNOSIS — D62 Acute posthemorrhagic anemia: Secondary | ICD-10-CM | POA: Diagnosis not present

## 2023-09-22 DIAGNOSIS — K922 Gastrointestinal hemorrhage, unspecified: Secondary | ICD-10-CM

## 2023-09-22 DIAGNOSIS — C8178 Other classical Hodgkin lymphoma, lymph nodes of multiple sites: Secondary | ICD-10-CM

## 2023-09-22 DIAGNOSIS — Z79899 Other long term (current) drug therapy: Secondary | ICD-10-CM | POA: Diagnosis not present

## 2023-09-22 DIAGNOSIS — Z87891 Personal history of nicotine dependence: Secondary | ICD-10-CM | POA: Diagnosis not present

## 2023-09-22 DIAGNOSIS — C8338 Diffuse large B-cell lymphoma, lymph nodes of multiple sites: Secondary | ICD-10-CM | POA: Diagnosis present

## 2023-09-22 DIAGNOSIS — Z7952 Long term (current) use of systemic steroids: Secondary | ICD-10-CM | POA: Diagnosis not present

## 2023-09-22 DIAGNOSIS — Z803 Family history of malignant neoplasm of breast: Secondary | ICD-10-CM | POA: Diagnosis not present

## 2023-09-22 DIAGNOSIS — Z95828 Presence of other vascular implants and grafts: Secondary | ICD-10-CM

## 2023-09-22 DIAGNOSIS — Z5112 Encounter for antineoplastic immunotherapy: Secondary | ICD-10-CM | POA: Diagnosis present

## 2023-09-22 DIAGNOSIS — Z5111 Encounter for antineoplastic chemotherapy: Secondary | ICD-10-CM | POA: Diagnosis present

## 2023-09-22 DIAGNOSIS — Z79630 Long term (current) use of alkylating agent: Secondary | ICD-10-CM | POA: Diagnosis not present

## 2023-09-22 DIAGNOSIS — R109 Unspecified abdominal pain: Secondary | ICD-10-CM | POA: Diagnosis not present

## 2023-09-22 DIAGNOSIS — E46 Unspecified protein-calorie malnutrition: Secondary | ICD-10-CM | POA: Diagnosis not present

## 2023-09-22 DIAGNOSIS — Z79633 Long term (current) use of mitotic inhibitor: Secondary | ICD-10-CM | POA: Diagnosis not present

## 2023-09-22 DIAGNOSIS — M62838 Other muscle spasm: Secondary | ICD-10-CM | POA: Diagnosis not present

## 2023-09-22 DIAGNOSIS — M545 Low back pain, unspecified: Secondary | ICD-10-CM | POA: Insufficient documentation

## 2023-09-22 DIAGNOSIS — R918 Other nonspecific abnormal finding of lung field: Secondary | ICD-10-CM | POA: Diagnosis not present

## 2023-09-22 DIAGNOSIS — G629 Polyneuropathy, unspecified: Secondary | ICD-10-CM | POA: Insufficient documentation

## 2023-09-22 DIAGNOSIS — K31811 Angiodysplasia of stomach and duodenum with bleeding: Secondary | ICD-10-CM | POA: Diagnosis not present

## 2023-09-22 DIAGNOSIS — D500 Iron deficiency anemia secondary to blood loss (chronic): Secondary | ICD-10-CM

## 2023-09-22 LAB — CMP (CANCER CENTER ONLY)
ALT: 25 U/L (ref 0–44)
AST: 19 U/L (ref 15–41)
Albumin: 3.9 g/dL (ref 3.5–5.0)
Alkaline Phosphatase: 73 U/L (ref 38–126)
Anion gap: 8 (ref 5–15)
BUN: 10 mg/dL (ref 8–23)
CO2: 26 mmol/L (ref 22–32)
Calcium: 9.4 mg/dL (ref 8.9–10.3)
Chloride: 103 mmol/L (ref 98–111)
Creatinine: 0.94 mg/dL (ref 0.61–1.24)
GFR, Estimated: >60 mL/min (ref >60)
Glucose, Bld: 122 mg/dL — ABNORMAL HIGH (ref 70–99)
Potassium: 3.3 mmol/L — ABNORMAL LOW (ref 3.5–5.1)
Sodium: 137 mmol/L (ref 135–145)
Total Bilirubin: 0.3 mg/dL (ref 0.0–1.2)
Total Protein: 7.2 g/dL (ref 6.5–8.1)

## 2023-09-22 LAB — CBC WITH DIFFERENTIAL (CANCER CENTER ONLY)
Abs Immature Granulocytes: 0.07 10*3/uL (ref 0.00–0.07)
Basophils Absolute: 0.1 10*3/uL (ref 0.0–0.1)
Basophils Relative: 1 %
Eosinophils Absolute: 0.2 10*3/uL (ref 0.0–0.5)
Eosinophils Relative: 3 %
HCT: 26.7 % — ABNORMAL LOW (ref 39.0–52.0)
Hemoglobin: 8.1 g/dL — ABNORMAL LOW (ref 13.0–17.0)
Immature Granulocytes: 1 %
Lymphocytes Relative: 16 %
Lymphs Abs: 1.4 10*3/uL (ref 0.7–4.0)
MCH: 26.2 pg (ref 26.0–34.0)
MCHC: 30.3 g/dL (ref 30.0–36.0)
MCV: 86.4 fL (ref 80.0–100.0)
Monocytes Absolute: 0.7 10*3/uL (ref 0.1–1.0)
Monocytes Relative: 8 %
Neutro Abs: 6.5 10*3/uL (ref 1.7–7.7)
Neutrophils Relative %: 71 %
Platelet Count: 273 10*3/uL (ref 150–400)
RBC: 3.09 MIL/uL — ABNORMAL LOW (ref 4.22–5.81)
RDW: 24.6 % — ABNORMAL HIGH (ref 11.5–15.5)
WBC Count: 9 10*3/uL (ref 4.0–10.5)
nRBC: 0 % (ref 0.0–0.2)

## 2023-09-22 MED ORDER — PALONOSETRON HCL INJECTION 0.25 MG/5ML
0.2500 mg | Freq: Once | INTRAVENOUS | Status: AC
  Administered 2023-09-22: 0.25 mg via INTRAVENOUS
  Filled 2023-09-22: qty 5

## 2023-09-22 MED ORDER — SODIUM CHLORIDE 0.9% FLUSH
10.0000 mL | INTRAVENOUS | Status: DC | PRN
  Administered 2023-09-22: 10 mL

## 2023-09-22 MED ORDER — DEXAMETHASONE SODIUM PHOSPHATE 10 MG/ML IJ SOLN
10.0000 mg | Freq: Once | INTRAMUSCULAR | Status: AC
  Administered 2023-09-22: 10 mg via INTRAVENOUS
  Filled 2023-09-22: qty 1

## 2023-09-22 MED ORDER — SODIUM CHLORIDE 0.9% FLUSH
10.0000 mL | INTRAVENOUS | Status: DC | PRN
  Administered 2023-09-22: 10 mL via INTRAVENOUS

## 2023-09-22 MED ORDER — DOXORUBICIN HCL CHEMO IV INJECTION 2 MG/ML
25.0000 mg/m2 | Freq: Once | INTRAVENOUS | Status: AC
Start: 2023-09-22 — End: 2023-09-22
  Administered 2023-09-22: 46 mg via INTRAVENOUS
  Filled 2023-09-22: qty 23

## 2023-09-22 MED ORDER — SODIUM CHLORIDE 0.9 % IV SOLN
150.0000 mg | Freq: Once | INTRAVENOUS | Status: AC
  Administered 2023-09-22: 150 mg via INTRAVENOUS
  Filled 2023-09-22: qty 150

## 2023-09-22 MED ORDER — DIPHENHYDRAMINE HCL 50 MG/ML IJ SOLN
50.0000 mg | Freq: Once | INTRAMUSCULAR | Status: AC
Start: 2023-09-22 — End: 2023-09-22
  Administered 2023-09-22: 50 mg via INTRAVENOUS
  Filled 2023-09-22: qty 1

## 2023-09-22 MED ORDER — SODIUM CHLORIDE 0.9 % IV SOLN
375.0000 mg/m2 | Freq: Once | INTRAVENOUS | Status: AC
  Administered 2023-09-22: 680 mg via INTRAVENOUS
  Filled 2023-09-22: qty 68

## 2023-09-22 MED ORDER — HEPARIN SOD (PORK) LOCK FLUSH 100 UNIT/ML IV SOLN
500.0000 [IU] | Freq: Once | INTRAVENOUS | Status: AC | PRN
Start: 2023-09-22 — End: 2023-09-22
  Administered 2023-09-22: 500 [IU]

## 2023-09-22 MED ORDER — SODIUM CHLORIDE 0.9 % IV SOLN: INTRAVENOUS | Status: DC

## 2023-09-22 MED ORDER — SODIUM CHLORIDE 0.9 % IV SOLN
6.0000 mg/m2 | Freq: Once | INTRAVENOUS | Status: AC
  Administered 2023-09-22: 10.9 mg via INTRAVENOUS
  Filled 2023-09-22: qty 10.9

## 2023-09-22 MED ORDER — ACETAMINOPHEN 325 MG PO TABS
650.0000 mg | ORAL_TABLET | Freq: Once | ORAL | Status: AC
  Administered 2023-09-22: 650 mg via ORAL
  Filled 2023-09-22: qty 2

## 2023-09-22 NOTE — Patient Instructions (Signed)
 [facility] - A DEPT OF [facility]  Discharge Instructions: Thank you for choosing [facility] to provide your oncology and hematology care.   If you have a lab appointment with the Cancer Center, please go directly to the Cancer Center and check in at the registration area.   Wear comfortable clothing and clothing appropriate for easy access to any Portacath or PICC line.   We strive to give you quality time with your provider. You may need to reschedule your appointment if you arrive late (15 or more minutes).  Arriving late affects you and other patients whose appointments are after yours.  Also, if you miss three or more appointments without notifying the office, you may be dismissed from the clinic at the provider's discretion.      For prescription refill requests, have your pharmacy contact our office and allow 72 hours for refills to be completed.    Today you received the following chemotherapy and/or immunotherapy agents: Doxorubicin  (Adriamycin ), Dacarbazine  (Dtic), and Vinblastine  (Velban )      To help prevent nausea and vomiting after your treatment, we encourage you to take your nausea medication as directed.  BELOW ARE SYMPTOMS THAT SHOULD BE REPORTED IMMEDIATELY: *FEVER GREATER THAN 100.4 F (38 C) OR HIGHER *CHILLS OR SWEATING *NAUSEA AND VOMITING THAT IS NOT CONTROLLED WITH YOUR NAUSEA MEDICATION *UNUSUAL SHORTNESS OF BREATH *UNUSUAL BRUISING OR BLEEDING *URINARY PROBLEMS (pain or burning when urinating, or frequent urination) *BOWEL PROBLEMS (unusual diarrhea, constipation, pain near the anus) TENDERNESS IN MOUTH AND THROAT WITH OR WITHOUT PRESENCE OF ULCERS (sore throat, sores in mouth, or a toothache) UNUSUAL RASH, SWELLING OR PAIN  UNUSUAL VAGINAL DISCHARGE OR ITCHING   Items with * indicate a potential emergency and should be followed up as soon as possible or go to the Emergency Department if any problems should  occur.  Please show the CHEMOTHERAPY ALERT CARD or IMMUNOTHERAPY ALERT CARD at check-in to the Emergency Department and triage nurse.  Should you have questions after your visit or need to cancel or reschedule your appointment, please contact [facility] - A DEPT OF [facility]  Dept: [phone]  and follow the prompts.  Office hours are 8:00 a.m. to 4:30 p.m. Monday - Friday. Please note that voicemails left after 4:00 p.m. may not be returned until the following business day.  We are closed weekends and major holidays. You have access to a nurse at all times for urgent questions. Please call the main number to the clinic Dept: [phone] and follow the prompts.   For any non-urgent questions, you may also contact your provider using MyChart. We now offer e-Visits for anyone 65 and older to request care online for non-urgent symptoms. For details visit [email or web address].   Also download the MyChart app! Go to the app store, search "MyChart", open the app, select [facility], and log in with your MyChart username and password.

## 2023-09-24 ENCOUNTER — Inpatient Hospital Stay

## 2023-09-24 DIAGNOSIS — Z5112 Encounter for antineoplastic immunotherapy: Secondary | ICD-10-CM | POA: Diagnosis not present

## 2023-09-24 DIAGNOSIS — C8178 Other classical Hodgkin lymphoma, lymph nodes of multiple sites: Secondary | ICD-10-CM

## 2023-09-24 MED ORDER — PEGFILGRASTIM-JMDB 6 MG/0.6ML ~~LOC~~ SOSY
6.0000 mg | PREFILLED_SYRINGE | Freq: Once | SUBCUTANEOUS | Status: AC
  Administered 2023-09-24: 6 mg via SUBCUTANEOUS

## 2023-09-28 NOTE — Progress Notes (Incomplete)
 HEMATOLOGY/ONCOLOGY CLINIC NOTE  Date of Service: 09/22/2023   Patient Care Team: Patient, No Pcp Per as PCP - General (General Practice)  CHIEF COMPLAINTS/PURPOSE OF CONSULTATION:  Follow-up for continued evaluation and mx of recently diagnosed Hodgkins lymphoma  HISTORY OF PRESENTING ILLNESS:   Timothy Hunter is a wonderful 63 y.o. male who has been referred to us  by Dr. 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   Patient presented to the ED on 03/09/2023 for GI bleed/acute blood loss anemia/microcytic anemia/low vitamin B12 levels and was seen by Dr. Lee Public.   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   Today, he is accompanied by his wife. He reports that he was recently seen in the hospital for anemia, with abdominal pain and black stools. His hgb was in 6s at the time. Patient received a CT scan of the abdomen and chest and was found to have several enlarged lymph glands in the center of his chest, including lung nodules and findings in the upper abdomen, bone, and thoracic spine. A biopsy of lymph node in retroperitoneum did show concerns for lymphoma.  Patient reports that he has otherwise been generally been pretty healthy otherwise. He notes taking medication for arthritis and reports a medical history of glaucoma. He denies any other chronic medical issues and does not take any medications on a regular basis otherwise. Patient denies having any surgeries in the past.   He reports that his lower back was evaluated earlier this year due to lower back pain, and there were not any concerning findings at that time. He denies any pain in the middle back. His back pain has  improved recently. His wife reports that his energy level has improved recently.   Patient does not smoke cigarettes at this time. He previously smoked 2 packs a day since he was 63 years old until about 2010. He reports that he rarely consumes alcohol and only consumes alcohol with social use. Patient does smoke marijuana.  There is a fhx of cancer. His wife reports that he has had family members that have passed away from cancer, but she is unsure of the type of cancer they had. His wife reports that patient's younger and older sister do have a history of cancer, and are in remission at this time. His older sister has breast cancer and breast cancer does run in the family. Patient denies any other medical issues in the family. He denies any blood disorders in the family.   Patient reports that he has not endorsed any black stools since being home from the hospital, and denies any other changes in bowel habits.   Patient's wife reports that patient did receive a blood transfusion while in the hospital, as well as potassium, but does not believe that patient received IV iron . His B12 level was also low at that time. He is not on any iron  or B12 replacement at this time.   He denies having any other symptoms at this time. Patient denies any lightheadedness/dizziness, SOB, chest pain, abdominal pain, or new leg swelling.  His wife reports that he did take one Tramadol  for back pain prevention. He did not have any back pain prior to taking it.   He reports that he has lost 12  pounds over the course of about 3 months. Patient reports that his eating habits are normal at this time. He does drink two Ensure protein drinks a day and has gained 5-6 pounds recently. He reports that he was not put on any steroids recently.   Patient denies new lumps/bumps. His wife reports that patient has a scar on right forehead, which has been present for a while. He denies any swelling in the groin or testicular  pain/swelling.   INTERVAL HISTORY  Timothy Hunter is a 63 y.o. male here for continued evaluation and management of Hodgkins lymphoma. He is here to receive cycle 6 day 1 of A-AVD chemotherapy.  Patient was last seen by me on 09/08/2023 and reported persistent tingling sensation on top of his fingers, intermittent bilateral leg cramps, mild fatigue, and occasional intermittent back pain.  Patient today notes that he continues to have neuropathic symptoms that are persistent and more bothersome especially in his feet and fingers.   MEDICAL HISTORY:  Past Medical History:  Diagnosis Date  . Dyspnea   . Glaucoma associated with anomalies of iris     SURGICAL HISTORY: Past Surgical History:  Procedure Laterality Date  . BIOPSY  03/11/2023   Procedure: BIOPSY;  Surgeon: Genell Ken, MD;  Location: [facility];  Service: Gastroenterology;;  . ESOPHAGOGASTRODUODENOSCOPY (EGD) WITH PROPOFOL  N/A 03/11/2023   Procedure: ESOPHAGOGASTRODUODENOSCOPY (EGD) WITH PROPOFOL ;  Surgeon: Genell Ken, MD;  Location: [facility];  Service: Gastroenterology;  Laterality: N/A;  . HOT HEMOSTASIS N/A 03/11/2023   Procedure: HOT HEMOSTASIS (ARGON PLASMA COAGULATION/BICAP);  Surgeon: Genell Ken, MD;  Location: [facility];  Service: Gastroenterology;  Laterality: N/A;  . IR IMAGING GUIDED PORT INSERTION  03/26/2023    SOCIAL HISTORY: Social History   Socioeconomic History  . Marital status: Married    Spouse name: Not on file  . Number of children: Not on file  . Years of education: Not on file  . Highest education level: Not on file  Occupational History  . Not on file  Tobacco Use  . Smoking status: Former    Passive exposure: Never  . Smokeless tobacco: Not on file  Vaping Use  . Vaping status: Never Used  Substance and Sexual Activity  . Alcohol use: Yes    Comment: occasionally  . Drug use: Yes    Types: Marijuana  . Sexual activity: Not on file  Other Topics Concern  . Not on  file  Social History Narrative  . Not on file   Social Drivers of Health   Financial Resource Strain: Not on file  Food Insecurity: No Food Insecurity (03/09/2023)   Hunger Vital Sign   . Worried About Programme researcher, broadcasting/film/video in the Last Year: Never true   . Ran Out of Food in the Last Year: Never true  Transportation Needs: No Transportation Needs (03/09/2023)   PRAPARE - Transportation   . Lack of Transportation (Medical): No   . Lack of Transportation (Non-Medical): No  Physical Activity: Not on file  Stress: Not on file  Social Connections: Not on file  Intimate Partner Violence: Not At Risk (03/09/2023)   Humiliation, Afraid, Rape, and Kick questionnaire   . Fear of Current or Ex-Partner: No   . Emotionally Abused: No   . Physically Abused: No   . Sexually Abused: No    FAMILY HISTORY: No family history on file.  ALLERGIES:  has no known allergies.  MEDICATIONS:  Current Outpatient Medications  Medication Sig Dispense Refill  .  calcium-vitamin D (OSCAL WITH D) 500-5 MG-MCG tablet Take 1 tablet by mouth daily.    . clotrimazole -betamethasone  (LOTRISONE ) cream Apply 1 Application topically 2 (two) times daily. To area of rash on shoulder and back. 30 g 1  . Cyanocobalamin  (VITAMIN B-12 PO) Take 1 tablet by mouth daily.    . dexamethasone  (DECADRON ) 4 MG tablet Take 2 tablets by mouth once a day for 3 days after chemotherapy. Take with food. 30 tablet 1  . feeding supplement (ENSURE ENLIVE / ENSURE PLUS) LIQD Take 237 mLs by mouth 2 (two) times daily between meals. 237 mL 12  . HYDROcodone -acetaminophen  (NORCO/VICODIN) 5-325 MG tablet Take 1 tablet by mouth every 6 (six) hours as needed for severe pain (pain score 7-10). (Patient not taking: Reported on 06/30/2023) 5 tablet 0  . latanoprost  (XALATAN ) 0.005 % ophthalmic solution Place 1 drop into both eyes at bedtime.    . lidocaine -prilocaine  (EMLA ) cream Apply to affected area once 30 g 3  . mirtazapine  (REMERON ) 15 MG tablet  Take 1 tablet (15 mg total) by mouth at bedtime. 30 tablet 1  . ondansetron  (ZOFRAN ) 8 MG tablet Take 1 tablet (8 mg total) by mouth every 8 (eight) hours as needed for nausea or vomiting. Start on the third day after chemotherapy. 30 tablet 1  . pantoprazole  (PROTONIX ) 40 MG tablet Take 1 tablet (40 mg total) by mouth 2 (two) times daily before a meal. 60 tablet 2  . prochlorperazine  (COMPAZINE ) 10 MG tablet Take 1 tablet (10 mg total) by mouth every 6 (six) hours as needed for nausea or vomiting. 30 tablet 1  . timolol  (TIMOPTIC ) 0.5 % ophthalmic solution Place 1 drop into both eyes in the morning.    . traMADol  (ULTRAM ) 50 MG tablet Take 1 tablet (50 mg total) by mouth every 6 (six) hours as needed. 30 tablet 0   No current facility-administered medications for this visit.    REVIEW OF SYSTEMS:    10 Point review of Systems was done is negative except as noted above.   PHYSICAL EXAMINATION: ECOG PERFORMANCE STATUS: 2 - Symptomatic, <50% confined to bed .There were no vitals taken for this visit.   GENERAL:alert, in no acute distress and comfortable SKIN: no acute rashes, no significant lesions EYES: conjunctiva are pink and non-injected, sclera anicteric OROPHARYNX: MMM, no exudates, no oropharyngeal erythema or ulceration NECK: supple, no JVD LYMPH:  no palpable lymphadenopathy in the cervical, axillary or inguinal regions LUNGS: clear to auscultation b/l with normal respiratory effort HEART: regular rate & rhythm ABDOMEN:  normoactive bowel sounds , non tender, not distended. Extremity: no pedal edema PSYCH: alert & oriented x 3 with fluent speech NEURO: no focal motor/sensory deficits   LABORATORY DATA:  I have reviewed the data as listed  .    Latest Ref Rng & Units 09/08/2023   11:47 AM 09/04/2023    9:44 AM 09/01/2023   12:48 PM  CBC  WBC 4.0 - 10.5 K/uL 10.9  11.8  7.4   Hemoglobin 13.0 - 17.0 g/dL 8.4  8.7  7.6   Hematocrit 39.0 - 52.0 % 27.4  29.1  24.6    Platelets 150 - 400 K/uL 343  335  382     .    Latest Ref Rng & Units 09/08/2023   11:47 AM 09/01/2023   12:48 PM 08/22/2023    9:36 AM  CMP  Glucose 70 - 99 mg/dL 098  119  147   BUN 8 - 23  mg/dL 9  9  8    Creatinine 0.61 - 1.24 mg/dL 4.78  2.95  6.21   Sodium 135 - 145 mmol/L 137  137  135   Potassium 3.5 - 5.1 mmol/L 3.3  3.2  3.5   Chloride 98 - 111 mmol/L 102  101  100   CO2 22 - 32 mmol/L 27  28  27    Calcium 8.9 - 10.3 mg/dL 9.4  9.3  9.2   Total Protein 6.5 - 8.1 g/dL 7.3  6.8  7.1   Total Bilirubin 0.0 - 1.2 mg/dL 0.3  0.3  0.3   Alkaline Phos 38 - 126 U/L 77  76  83   AST 15 - 41 U/L 15  9  13    ALT 0 - 44 U/L 18  14  20     04/22/2023 right supraclavicular lymph node biopsy:         03/11/2023 biopsy of left retroperitoneal lymph node:     RADIOGRAPHIC STUDIES: I have personally reviewed the radiological images as listed and agreed with the findings in the report. No results found.    ASSESSMENT & PLAN:   63 y.o. 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 3. Hx of esophageal candidiasis 4.Back pain due to involvement by lymphoma -- pain now resolved. 5.  Protein calorie malnutrition-improving with lymphoma treatment has gained 7 pounds since his last visit . Wt Readings from Last 3 Encounters:  09/08/23 152 lb 6.4 oz (69.1 kg)  08/25/23 155 lb (70.3 kg)  08/22/23 154 lb 9.6 oz (70.1 kg)    PLAN:  -Discussed lab results from today, 09/22/2023, in detail with patient  -continue vitamin B complex and vitamin B12  FOLLOW-UP: ***  The total time spent in the appointment was *** minutes* .  All of the patient's questions were answered with apparent satisfaction. The patient knows to call the clinic with any problems, questions or concerns.   Jacquelyn Matt MD MS AAHIVMS [facility] Hematology/Oncology Physician [facility]  .*Total Encounter  Time as defined by the Centers for Medicare and Medicaid Services includes, in addition to the face-to-face time of a patient visit (documented in the note above) non-face-to-face time: obtaining and reviewing outside history, ordering and reviewing medications, tests or procedures, care coordination (communications with other health care professionals or caregivers) and documentation in the medical record.    I,Mitra Faeizi,acting as a Neurosurgeon for Jacquelyn Matt, MD.,have documented all relevant documentation on the behalf of Jacquelyn Matt, MD,as directed by  Jacquelyn Matt, MD while in the presence of Jacquelyn Matt, MD.  ***

## 2023-09-29 ENCOUNTER — Encounter: Payer: Self-pay | Admitting: Hematology

## 2023-10-03 MED FILL — Fosaprepitant Dimeglumine For IV Infusion 150 MG (Base Eq): INTRAVENOUS | Qty: 5 | Status: AC

## 2023-10-07 ENCOUNTER — Inpatient Hospital Stay (HOSPITAL_BASED_OUTPATIENT_CLINIC_OR_DEPARTMENT_OTHER): Admitting: Hematology

## 2023-10-07 ENCOUNTER — Inpatient Hospital Stay

## 2023-10-07 ENCOUNTER — Inpatient Hospital Stay: Admitting: Dietician

## 2023-10-07 VITALS — BP 112/75 | HR 86 | Temp 98.0°F | Resp 16

## 2023-10-07 VITALS — BP 123/83 | HR 101 | Temp 97.9°F | Resp 18 | Wt 156.2 lb

## 2023-10-07 DIAGNOSIS — C8198 Hodgkin lymphoma, unspecified, lymph nodes of multiple sites: Secondary | ICD-10-CM

## 2023-10-07 DIAGNOSIS — D500 Iron deficiency anemia secondary to blood loss (chronic): Secondary | ICD-10-CM

## 2023-10-07 DIAGNOSIS — Z5111 Encounter for antineoplastic chemotherapy: Secondary | ICD-10-CM | POA: Diagnosis not present

## 2023-10-07 DIAGNOSIS — C8178 Other classical Hodgkin lymphoma, lymph nodes of multiple sites: Secondary | ICD-10-CM

## 2023-10-07 DIAGNOSIS — Z95828 Presence of other vascular implants and grafts: Secondary | ICD-10-CM

## 2023-10-07 DIAGNOSIS — Z5112 Encounter for antineoplastic immunotherapy: Secondary | ICD-10-CM | POA: Diagnosis not present

## 2023-10-07 DIAGNOSIS — K922 Gastrointestinal hemorrhage, unspecified: Secondary | ICD-10-CM

## 2023-10-07 LAB — CBC WITH DIFFERENTIAL (CANCER CENTER ONLY)
Abs Immature Granulocytes: 0.05 10*3/uL (ref 0.00–0.07)
Basophils Absolute: 0.1 10*3/uL (ref 0.0–0.1)
Basophils Relative: 1 %
Eosinophils Absolute: 0.2 10*3/uL (ref 0.0–0.5)
Eosinophils Relative: 3 %
HCT: 29.8 % — ABNORMAL LOW (ref 39.0–52.0)
Hemoglobin: 8.8 g/dL — ABNORMAL LOW (ref 13.0–17.0)
Immature Granulocytes: 1 %
Lymphocytes Relative: 17 %
Lymphs Abs: 1.6 10*3/uL (ref 0.7–4.0)
MCH: 26 pg (ref 26.0–34.0)
MCHC: 29.5 g/dL — ABNORMAL LOW (ref 30.0–36.0)
MCV: 87.9 fL (ref 80.0–100.0)
Monocytes Absolute: 0.5 10*3/uL (ref 0.1–1.0)
Monocytes Relative: 6 %
Neutro Abs: 6.5 10*3/uL (ref 1.7–7.7)
Neutrophils Relative %: 72 %
Platelet Count: 242 10*3/uL (ref 150–400)
RBC: 3.39 MIL/uL — ABNORMAL LOW (ref 4.22–5.81)
RDW: 24.9 % — ABNORMAL HIGH (ref 11.5–15.5)
WBC Count: 9 10*3/uL (ref 4.0–10.5)
nRBC: 0 % (ref 0.0–0.2)

## 2023-10-07 LAB — CMP (CANCER CENTER ONLY)
ALT: 15 U/L (ref 0–44)
AST: 14 U/L — ABNORMAL LOW (ref 15–41)
Albumin: 4 g/dL (ref 3.5–5.0)
Alkaline Phosphatase: 70 U/L (ref 38–126)
Anion gap: 10 (ref 5–15)
BUN: 10 mg/dL (ref 8–23)
CO2: 24 mmol/L (ref 22–32)
Calcium: 9.6 mg/dL (ref 8.9–10.3)
Chloride: 103 mmol/L (ref 98–111)
Creatinine: 0.78 mg/dL (ref 0.61–1.24)
GFR, Estimated: >60 mL/min (ref >60)
Glucose, Bld: 146 mg/dL — ABNORMAL HIGH (ref 70–99)
Potassium: 3.4 mmol/L — ABNORMAL LOW (ref 3.5–5.1)
Sodium: 137 mmol/L (ref 135–145)
Total Bilirubin: 0.3 mg/dL (ref 0.0–1.2)
Total Protein: 7.5 g/dL (ref 6.5–8.1)

## 2023-10-07 MED ORDER — HEPARIN SOD (PORK) LOCK FLUSH 100 UNIT/ML IV SOLN
500.0000 [IU] | Freq: Once | INTRAVENOUS | Status: AC | PRN
  Administered 2023-10-07: 500 [IU]

## 2023-10-07 MED ORDER — PALONOSETRON HCL INJECTION 0.25 MG/5ML
0.2500 mg | Freq: Once | INTRAVENOUS | Status: AC
  Administered 2023-10-07: 0.25 mg via INTRAVENOUS
  Filled 2023-10-07: qty 5

## 2023-10-07 MED ORDER — DEXAMETHASONE SODIUM PHOSPHATE 10 MG/ML IJ SOLN
10.0000 mg | Freq: Once | INTRAMUSCULAR | Status: AC
  Administered 2023-10-07: 10 mg via INTRAVENOUS
  Filled 2023-10-07: qty 1

## 2023-10-07 MED ORDER — DOXORUBICIN HCL CHEMO IV INJECTION 2 MG/ML
25.0000 mg/m2 | Freq: Once | INTRAVENOUS | Status: AC
  Administered 2023-10-07: 46 mg via INTRAVENOUS
  Filled 2023-10-07: qty 23

## 2023-10-07 MED ORDER — SODIUM CHLORIDE 0.9 % IV SOLN: INTRAVENOUS | Status: DC

## 2023-10-07 MED ORDER — SODIUM CHLORIDE 0.9 % IV SOLN
375.0000 mg/m2 | Freq: Once | INTRAVENOUS | Status: AC
  Administered 2023-10-07: 680 mg via INTRAVENOUS
  Filled 2023-10-07: qty 68

## 2023-10-07 MED ORDER — SODIUM CHLORIDE 0.9% FLUSH
10.0000 mL | INTRAVENOUS | Status: DC | PRN
  Administered 2023-10-07: 10 mL

## 2023-10-07 MED ORDER — ACETAMINOPHEN 325 MG PO TABS
650.0000 mg | ORAL_TABLET | Freq: Once | ORAL | Status: AC
  Administered 2023-10-07: 650 mg via ORAL
  Filled 2023-10-07: qty 2

## 2023-10-07 MED ORDER — SODIUM CHLORIDE 0.9% FLUSH
10.0000 mL | INTRAVENOUS | Status: DC | PRN
  Administered 2023-10-07: 10 mL via INTRAVENOUS

## 2023-10-07 MED ORDER — SODIUM CHLORIDE 0.9 % IV SOLN
150.0000 mg | Freq: Once | INTRAVENOUS | Status: AC
  Administered 2023-10-07: 150 mg via INTRAVENOUS
  Filled 2023-10-07: qty 150

## 2023-10-07 MED ORDER — DIPHENHYDRAMINE HCL 50 MG/ML IJ SOLN
50.0000 mg | Freq: Once | INTRAMUSCULAR | Status: AC
  Administered 2023-10-07: 50 mg via INTRAVENOUS
  Filled 2023-10-07: qty 1

## 2023-10-07 MED ORDER — VINBLASTINE SULFATE CHEMO INJECTION 1 MG/ML
6.0000 mg/m2 | Freq: Once | INTRAVENOUS | Status: AC
  Administered 2023-10-07: 10.9 mg via INTRAVENOUS
  Filled 2023-10-07: qty 10.9

## 2023-10-07 NOTE — Patient Instructions (Signed)
 [facility] - A DEPT OF [facility]  Discharge Instructions: Thank you for choosing [facility] to provide your oncology and hematology care.   If you have a lab appointment with the Cancer Center, please go directly to the Cancer Center and check in at the registration area.   Wear comfortable clothing and clothing appropriate for easy access to any Portacath or PICC line.   We strive to give you quality time with your provider. You may need to reschedule your appointment if you arrive late (15 or more minutes).  Arriving late affects you and other patients whose appointments are after yours.  Also, if you miss three or more appointments without notifying the office, you may be dismissed from the clinic at the provider's discretion.      For prescription refill requests, have your pharmacy contact our office and allow 72 hours for refills to be completed.    Today you received the following chemotherapy and/or immunotherapy agents: Doxorubicin  (Adriamycin ), Dacarbazine  (Dtic), and Vinblastine  (Velban )      To help prevent nausea and vomiting after your treatment, we encourage you to take your nausea medication as directed.  BELOW ARE SYMPTOMS THAT SHOULD BE REPORTED IMMEDIATELY: *FEVER GREATER THAN 100.4 F (38 C) OR HIGHER *CHILLS OR SWEATING *NAUSEA AND VOMITING THAT IS NOT CONTROLLED WITH YOUR NAUSEA MEDICATION *UNUSUAL SHORTNESS OF BREATH *UNUSUAL BRUISING OR BLEEDING *URINARY PROBLEMS (pain or burning when urinating, or frequent urination) *BOWEL PROBLEMS (unusual diarrhea, constipation, pain near the anus) TENDERNESS IN MOUTH AND THROAT WITH OR WITHOUT PRESENCE OF ULCERS (sore throat, sores in mouth, or a toothache) UNUSUAL RASH, SWELLING OR PAIN  UNUSUAL VAGINAL DISCHARGE OR ITCHING   Items with * indicate a potential emergency and should be followed up as soon as possible or go to the Emergency Department if any problems should  occur.  Please show the CHEMOTHERAPY ALERT CARD or IMMUNOTHERAPY ALERT CARD at check-in to the Emergency Department and triage nurse.  Should you have questions after your visit or need to cancel or reschedule your appointment, please contact [facility] - A DEPT OF [facility]  Dept: [phone]  and follow the prompts.  Office hours are 8:00 a.m. to 4:30 p.m. Monday - Friday. Please note that voicemails left after 4:00 p.m. may not be returned until the following business day.  We are closed weekends and major holidays. You have access to a nurse at all times for urgent questions. Please call the main number to the clinic Dept: [phone] and follow the prompts.   For any non-urgent questions, you may also contact your provider using MyChart. We now offer e-Visits for anyone 65 and older to request care online for non-urgent symptoms. For details visit [email or web address].   Also download the MyChart app! Go to the app store, search "MyChart", open the app, select [facility], and log in with your MyChart username and password.

## 2023-10-07 NOTE — Progress Notes (Signed)
 HEMATOLOGY/ONCOLOGY CLINIC NOTE  Date of Service: 10/07/23    Patient Care Team: Patient, No Pcp Per as PCP - General (General Practice)  CHIEF COMPLAINTS/PURPOSE OF CONSULTATION:  Follow-up for continued evaluation and mx of recently diagnosed Hodgkins lymphoma  HISTORY OF PRESENTING ILLNESS:   Timothy Hunter is a wonderful 63 y.o. male who has been referred to us  by Dr. 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   Patient presented to the ED on 03/09/2023 for GI bleed/acute blood loss anemia/microcytic anemia/low vitamin B12 levels and was seen by Dr. Lee Public.   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   Today, he is accompanied by his wife. He reports that he was recently seen in the hospital for anemia, with abdominal pain and black stools. His hgb was in 6s at the time. Patient received a CT scan of the abdomen and chest and was found to have several enlarged lymph glands in the center of his chest, including lung nodules and findings in the upper abdomen, bone, and thoracic spine. A biopsy of lymph node in retroperitoneum did show concerns for lymphoma.  Patient reports that he has otherwise been generally been pretty healthy otherwise. He notes taking medication for arthritis and reports a medical history of glaucoma. He denies any other chronic medical issues and does not take any medications on a regular basis otherwise. Patient denies having any surgeries in the past.   He reports that his lower back was evaluated earlier this year due to lower back pain, and there were not any concerning findings at that time. He denies any pain in the middle back. His back pain has  improved recently. His wife reports that his energy level has improved recently.   Patient does not smoke cigarettes at this time. He previously smoked 2 packs a day since he was 63 years old until about 2010. He reports that he rarely consumes alcohol and only consumes alcohol with social use. Patient does smoke marijuana.  There is a fhx of cancer. His wife reports that he has had family members that have passed away from cancer, but she is unsure of the type of cancer they had. His wife reports that patient's younger and older sister do have a history of cancer, and are in remission at this time. His older sister has breast cancer and breast cancer does run in the family. Patient denies any other medical issues in the family. He denies any blood disorders in the family.   Patient reports that he has not endorsed any black stools since being home from the hospital, and denies any other changes in bowel habits.   Patient's wife reports that patient did receive a blood transfusion while in the hospital, as well as potassium, but does not believe that patient received IV iron . His B12 level was also low at that time. He is not on any iron  or B12 replacement at this time.   He denies having any other symptoms at this time. Patient denies any lightheadedness/dizziness, SOB, chest pain, abdominal pain, or new leg swelling.  His wife reports that he did take one Tramadol  for back pain prevention. He did not have any back pain prior to taking it.   He reports that he has lost  12 pounds over the course of about 3 months. Patient reports that his eating habits are normal at this time. He does drink two Ensure protein drinks a day and has gained 5-6 pounds recently. He reports that he was not put on any steroids recently.   Patient denies new lumps/bumps. His wife reports that patient has a scar on right forehead, which has been present for a while. He denies any swelling in the groin or testicular  pain/swelling.   INTERVAL HISTORY  Timothy Hunter is a 63 y.o. male here for continued evaluation and management of Hodgkins lymphoma. He is here to receive cycle 6 day 15 of A-AVD chemotherapy - will continue to HOLD Adcetris .   Patient was last seen by me on 09/22/2023 and reported persistent neuropathy which was worsening especially in feet and fingers.   He is accompanied by his wife during today's visit. Patient has tolerated his last treatment well with no major toxicity issues.   He reports that he generally has loss of appetite and taste for a couple of days after treatment. Patient is noted to weigh 156 pounds in clinic today. He reports that he is about 10 pounds away from his baseline weight.   He complains of foot cramping on the side of his foot, which he describes as a muscle spasm. His pain is also present when lying down. Pain is not constant all the time.   He reports that he is able to mow the grass and does need to rest once he feels SOB.   He denies any back pain, black stools, blood in stools, fever, chills, infection issues, or leg swelling.  He is taking vitamin D supplementation.   MEDICAL HISTORY:  Past Medical History:  Diagnosis Date   Dyspnea    Glaucoma associated with anomalies of iris     SURGICAL HISTORY: Past Surgical History:  Procedure Laterality Date   BIOPSY  03/11/2023   Procedure: BIOPSY;  Surgeon: Genell Ken, MD;  Location: [facility];  Service: Gastroenterology;;   ESOPHAGOGASTRODUODENOSCOPY (EGD) WITH PROPOFOL  N/A 03/11/2023   Procedure: ESOPHAGOGASTRODUODENOSCOPY (EGD) WITH PROPOFOL ;  Surgeon: Genell Ken, MD;  Location: [facility];  Service: Gastroenterology;  Laterality: N/A;   HOT HEMOSTASIS N/A 03/11/2023   Procedure: HOT HEMOSTASIS (ARGON PLASMA COAGULATION/BICAP);  Surgeon: Genell Ken, MD;  Location: [facility];  Service: Gastroenterology;  Laterality: N/A;   IR IMAGING GUIDED PORT INSERTION  03/26/2023    SOCIAL  HISTORY: Social History   Socioeconomic History   Marital status: Married    Spouse name: Not on file   Number of children: Not on file   Years of education: Not on file   Highest education level: Not on file  Occupational History   Not on file  Tobacco Use   Smoking status: Former    Passive exposure: Never   Smokeless tobacco: Not on file  Vaping Use   Vaping status: Never Used  Substance and Sexual Activity   Alcohol use: Yes    Comment: occasionally   Drug use: Yes    Types: Marijuana   Sexual activity: Not on file  Other Topics Concern   Not on file  Social History Narrative   Not on file   Social Drivers of Health   Financial Resource Strain: Not on file  Food Insecurity: No Food Insecurity (03/09/2023)   Hunger Vital Sign    Worried About Running Out of Food in the Last Year: Never true    Ran Out of  Food in the Last Year: Never true  Transportation Needs: No Transportation Needs (03/09/2023)   PRAPARE - Administrator, Civil Service (Medical): No    Lack of Transportation (Non-Medical): No  Physical Activity: Not on file  Stress: Not on file  Social Connections: Not on file  Intimate Partner Violence: Not At Risk (03/09/2023)   Humiliation, Afraid, Rape, and Kick questionnaire    Fear of Current or Ex-Partner: No    Emotionally Abused: No    Physically Abused: No    Sexually Abused: No    FAMILY HISTORY: No family history on file.  ALLERGIES:  has no known allergies.  MEDICATIONS:  Current Outpatient Medications  Medication Sig Dispense Refill   calcium-vitamin D (OSCAL WITH D) 500-5 MG-MCG tablet Take 1 tablet by mouth daily.     clotrimazole -betamethasone  (LOTRISONE ) cream Apply 1 Application topically 2 (two) times daily. To area of rash on shoulder and back. 30 g 1   Cyanocobalamin  (VITAMIN B-12 PO) Take 1 tablet by mouth daily. Patient taking in liquid form     dexamethasone  (DECADRON ) 4 MG tablet Take 2 tablets by mouth once a day  for 3 days after chemotherapy. Take with food. 30 tablet 1   feeding supplement (ENSURE ENLIVE / ENSURE PLUS) LIQD Take 237 mLs by mouth 2 (two) times daily between meals. 237 mL 12   HYDROcodone -acetaminophen  (NORCO/VICODIN) 5-325 MG tablet Take 1 tablet by mouth every 6 (six) hours as needed for severe pain (pain score 7-10). (Patient not taking: Reported on 09/22/2023) 5 tablet 0   latanoprost  (XALATAN ) 0.005 % ophthalmic solution Place 1 drop into both eyes at bedtime.     lidocaine -prilocaine  (EMLA ) cream Apply to affected area once 30 g 3   mirtazapine  (REMERON ) 15 MG tablet Take 1 tablet (15 mg total) by mouth at bedtime. 30 tablet 1   ondansetron  (ZOFRAN ) 8 MG tablet Take 1 tablet (8 mg total) by mouth every 8 (eight) hours as needed for nausea or vomiting. Start on the third day after chemotherapy. 30 tablet 1   pantoprazole  (PROTONIX ) 40 MG tablet Take 1 tablet (40 mg total) by mouth 2 (two) times daily before a meal. 60 tablet 2   prochlorperazine  (COMPAZINE ) 10 MG tablet Take 1 tablet (10 mg total) by mouth every 6 (six) hours as needed for nausea or vomiting. 30 tablet 1   timolol  (TIMOPTIC ) 0.5 % ophthalmic solution Place 1 drop into both eyes in the morning.     traMADol  (ULTRAM ) 50 MG tablet Take 1 tablet (50 mg total) by mouth every 6 (six) hours as needed. 30 tablet 0   No current facility-administered medications for this visit.    REVIEW OF SYSTEMS:    10 Point review of Systems was done is negative except as noted above.   PHYSICAL EXAMINATION: ECOG PERFORMANCE STATUS: 2 - Symptomatic, <50% confined to bed .BP 123/83   Pulse (!) 101   Temp 97.9 F (36.6 C)   Resp 18   Wt 156 lb 3.2 oz (70.9 kg)   SpO2 100%   BMI 23.07 kg/m   GENERAL:alert, in no acute distress and comfortable SKIN: no acute rashes, no significant lesions EYES: conjunctiva are pink and non-injected, sclera anicteric OROPHARYNX: MMM, no exudates, no oropharyngeal erythema or ulceration NECK:  supple, no JVD LYMPH:  no palpable lymphadenopathy in the cervical, axillary or inguinal regions LUNGS: clear to auscultation b/l with normal respiratory effort HEART: regular rate & rhythm ABDOMEN:  normoactive bowel sounds ,  non tender, not distended. Extremity: no pedal edema PSYCH: alert & oriented x 3 with fluent speech NEURO: no focal motor/sensory deficits   LABORATORY DATA:  I have reviewed the data as listed  .    Latest Ref Rng & Units 10/07/2023   11:26 AM 09/22/2023   11:33 AM 09/08/2023   11:47 AM  CBC  WBC 4.0 - 10.5 K/uL 9.0  9.0  10.9   Hemoglobin 13.0 - 17.0 g/dL 8.8  8.1  8.4   Hematocrit 39.0 - 52.0 % 29.8  26.7  27.4   Platelets 150 - 400 K/uL 242  273  343     .    Latest Ref Rng & Units 10/07/2023   11:26 AM 09/22/2023   11:33 AM 09/08/2023   11:47 AM  CMP  Glucose 70 - 99 mg/dL 409  811  914   BUN 8 - 23 mg/dL 10  10  9    Creatinine 0.61 - 1.24 mg/dL 7.82  9.56  2.13   Sodium 135 - 145 mmol/L 137  137  137   Potassium 3.5 - 5.1 mmol/L 3.4  3.3  3.3   Chloride 98 - 111 mmol/L 103  103  102   CO2 22 - 32 mmol/L 24  26  27    Calcium 8.9 - 10.3 mg/dL 9.6  9.4  9.4   Total Protein 6.5 - 8.1 g/dL 7.5  7.2  7.3   Total Bilirubin 0.0 - 1.2 mg/dL 0.3  0.3  0.3   Alkaline Phos 38 - 126 U/L 70  73  77   AST 15 - 41 U/L 14  19  15    ALT 0 - 44 U/L 15  25  18     04/22/2023 right supraclavicular lymph node biopsy:         03/11/2023 biopsy of left retroperitoneal lymph node:     RADIOGRAPHIC STUDIES: I have personally reviewed the radiological images as listed and agreed with the findings in the report. No results found.    ASSESSMENT & PLAN:   63 y.o. 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 3. Hx of esophageal candidiasis 4.Back pain due to involvement by lymphoma -- pain now resolved. 5.  Protein calorie malnutrition-improving with lymphoma  treatment has gained 7 pounds since his last visit . Wt Readings from Last 3 Encounters:  09/22/23 156 lb 1.6 oz (70.8 kg)  09/08/23 152 lb 6.4 oz (69.1 kg)  08/25/23 155 lb (70.3 kg)   6.  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 CBC improved, showed WBC of 9.0K, hemoglobin of 8.8, and platelets of 242K. -CMP stable -discussed that his foot cramping could be from chemotherapy and anemia and also from growth factor shot to come extent -patient has no major toxicities from treatment -patient appropriate to proceed with cycle 6 day 15 of treatment today -continue to HOLD Adcetris  -will plan for post-treatment PET scan in 1 month -if his PET scan shows that he is in complete remission, we will switch to monitoring  -If his PET scan shows no concerns, we will plan for port-a-cath removal -recommend patient to stay well-hydrated with 64 ounces of water daily, optimizing electrolytes with potassium and magnesium -rich foods, optimizing protein, and and staying physically active to prevent loss of muscle mass -discussed option of OTC slow-release magnesium  tablet  -  continue vitamin D supplementation  FOLLOW-UP: PET/CT in 3-4 weeks RTC with Dr Salomon Cree with portflush and labs in 5 weeks  The total time spent in the appointment was 30 minutes* .  All of the patient's questions were answered with apparent satisfaction. The patient knows to call the clinic with any problems, questions or concerns.   Jacquelyn Matt MD MS AAHIVMS [facility] Hematology/Oncology Physician [facility]  .*Total Encounter Time as defined by the Centers for Medicare and Medicaid Services includes, in addition to the face-to-face time of a patient visit (documented in the note above) non-face-to-face time: obtaining and reviewing outside history, ordering and reviewing medications, tests or procedures, care coordination (communications with  other health care professionals or caregivers) and documentation in the medical record.    I,Mitra Faeizi,acting as a Neurosurgeon for Jacquelyn Matt, MD.,have documented all relevant documentation on the behalf of Jacquelyn Matt, MD,as directed by  Jacquelyn Matt, MD while in the presence of Jacquelyn Matt, MD.  .I have reviewed the above documentation for accuracy and completeness, and I agree with the above. .Chenise Mulvihill Kishore Aleigh Grunden MD

## 2023-10-07 NOTE — Progress Notes (Signed)
 Nutrition Follow-up:  Patient with stage IV, hodgkin lymphoma (gray zone lymphoma). Patient receiving adrimaycin, dacarbacine, vinblastine    Met with patient in infusion. Today is his final treatment. Patient feeling good today and glad for last infusion. His appetite has improved. Patient eating all through out the day, anything he can get his hands on. Recalls high protein smoothie, chips, nuts, pizza, and apple pie yesterday. He continues drinking at least 2 Ensure Complete. Patient reports slowly improving neuropathy. Denies nausea, vomiting, diarrhea, constipation.    Medications: reviewed   Labs: K 3.4  Anthropometrics: Wt 156 lb 3.2 oz today - stable  5/12 - 156 lb 1.6 oz  4/28 - 152 lb 6.4 oz  3/31 - 146 lb 6.4 oz   NUTRITION DIAGNOSIS: Inadequate oral intake improved    INTERVENTION:  Continue strategies for increasing calories and protein Continue 2 Ensure Complete - coupons provided     MONITORING, EVALUATION, GOAL: wt trends, intake   NEXT VISIT: No follow-up scheduled. Patient encouraged to contact with nutrition questions/concerns as needed

## 2023-10-08 ENCOUNTER — Telehealth: Payer: Self-pay | Admitting: Hematology

## 2023-10-08 NOTE — Telephone Encounter (Signed)
 Left a voicemail with the appointment details.

## 2023-10-09 ENCOUNTER — Inpatient Hospital Stay

## 2023-10-09 ENCOUNTER — Other Ambulatory Visit: Payer: Self-pay

## 2023-10-09 VITALS — BP 116/68 | HR 93 | Temp 98.6°F | Resp 18

## 2023-10-09 DIAGNOSIS — C8178 Other classical Hodgkin lymphoma, lymph nodes of multiple sites: Secondary | ICD-10-CM

## 2023-10-09 DIAGNOSIS — Z5112 Encounter for antineoplastic immunotherapy: Secondary | ICD-10-CM | POA: Diagnosis not present

## 2023-10-09 MED ORDER — PEGFILGRASTIM-JMDB 6 MG/0.6ML ~~LOC~~ SOSY
6.0000 mg | PREFILLED_SYRINGE | Freq: Once | SUBCUTANEOUS | Status: AC
  Administered 2023-10-09: 6 mg via SUBCUTANEOUS
  Filled 2023-10-09: qty 0.6

## 2023-10-12 ENCOUNTER — Encounter: Payer: Self-pay | Admitting: Hematology

## 2023-10-27 ENCOUNTER — Encounter: Payer: Self-pay | Admitting: Hematology

## 2023-10-27 ENCOUNTER — Encounter (HOSPITAL_COMMUNITY)
Admission: RE | Admit: 2023-10-27 | Discharge: 2023-10-27 | Disposition: A | Discharge status: Home / Self Care | Source: Ambulatory Visit | Attending: Hematology | Admitting: Hematology

## 2023-10-27 DIAGNOSIS — C8198 Hodgkin lymphoma, unspecified, lymph nodes of multiple sites: Secondary | ICD-10-CM | POA: Diagnosis not present

## 2023-10-27 DIAGNOSIS — C8590 Non-Hodgkin lymphoma, unspecified, unspecified site: Secondary | ICD-10-CM | POA: Diagnosis not present

## 2023-10-27 LAB — GLUCOSE, CAPILLARY: Glucose-Capillary: 111 mg/dL — ABNORMAL HIGH (ref 70–99)

## 2023-10-27 MED ORDER — FLUDEOXYGLUCOSE F - 18 (FDG) INJECTION
7.7800 | Freq: Once | INTRAVENOUS | Status: AC | PRN
  Administered 2023-10-27: 7.78 via INTRAVENOUS

## 2023-11-07 ENCOUNTER — Encounter: Payer: Self-pay | Admitting: Hematology

## 2023-11-12 ENCOUNTER — Inpatient Hospital Stay: Attending: Hematology

## 2023-11-12 ENCOUNTER — Inpatient Hospital Stay: Admitting: Hematology

## 2023-11-12 ENCOUNTER — Encounter: Payer: Self-pay | Admitting: Hematology

## 2023-11-12 VITALS — BP 133/82 | HR 68 | Temp 97.3°F | Resp 20 | Wt 162.1 lb

## 2023-11-12 DIAGNOSIS — Z87891 Personal history of nicotine dependence: Secondary | ICD-10-CM | POA: Diagnosis not present

## 2023-11-12 DIAGNOSIS — E46 Unspecified protein-calorie malnutrition: Secondary | ICD-10-CM | POA: Diagnosis not present

## 2023-11-12 DIAGNOSIS — N529 Male erectile dysfunction, unspecified: Secondary | ICD-10-CM | POA: Insufficient documentation

## 2023-11-12 DIAGNOSIS — G629 Polyneuropathy, unspecified: Secondary | ICD-10-CM | POA: Diagnosis not present

## 2023-11-12 DIAGNOSIS — K31811 Angiodysplasia of stomach and duodenum with bleeding: Secondary | ICD-10-CM | POA: Insufficient documentation

## 2023-11-12 DIAGNOSIS — C8338 Diffuse large B-cell lymphoma, lymph nodes of multiple sites: Secondary | ICD-10-CM | POA: Diagnosis present

## 2023-11-12 DIAGNOSIS — R110 Nausea: Secondary | ICD-10-CM | POA: Insufficient documentation

## 2023-11-12 DIAGNOSIS — D62 Acute posthemorrhagic anemia: Secondary | ICD-10-CM | POA: Diagnosis not present

## 2023-11-12 DIAGNOSIS — Z95828 Presence of other vascular implants and grafts: Secondary | ICD-10-CM

## 2023-11-12 DIAGNOSIS — C8178 Other classical Hodgkin lymphoma, lymph nodes of multiple sites: Secondary | ICD-10-CM

## 2023-11-12 DIAGNOSIS — Z79899 Other long term (current) drug therapy: Secondary | ICD-10-CM | POA: Diagnosis not present

## 2023-11-12 DIAGNOSIS — Z803 Family history of malignant neoplasm of breast: Secondary | ICD-10-CM | POA: Insufficient documentation

## 2023-11-12 DIAGNOSIS — M549 Dorsalgia, unspecified: Secondary | ICD-10-CM | POA: Insufficient documentation

## 2023-11-12 LAB — CBC WITH DIFFERENTIAL (CANCER CENTER ONLY)
Abs Immature Granulocytes: 0 10*3/uL (ref 0.00–0.07)
Basophils Absolute: 0.1 10*3/uL (ref 0.0–0.1)
Basophils Relative: 1 %
Eosinophils Absolute: 0.4 10*3/uL (ref 0.0–0.5)
Eosinophils Relative: 11 %
HCT: 36.4 % — ABNORMAL LOW (ref 39.0–52.0)
Hemoglobin: 10.9 g/dL — ABNORMAL LOW (ref 13.0–17.0)
Immature Granulocytes: 0 %
Lymphocytes Relative: 48 %
Lymphs Abs: 1.8 10*3/uL (ref 0.7–4.0)
MCH: 27.4 pg (ref 26.0–34.0)
MCHC: 29.9 g/dL — ABNORMAL LOW (ref 30.0–36.0)
MCV: 91.5 fL (ref 80.0–100.0)
Monocytes Absolute: 0.4 10*3/uL (ref 0.1–1.0)
Monocytes Relative: 10 %
Neutro Abs: 1.1 10*3/uL — ABNORMAL LOW (ref 1.7–7.7)
Neutrophils Relative %: 30 %
Platelet Count: 200 10*3/uL (ref 150–400)
RBC: 3.98 MIL/uL — ABNORMAL LOW (ref 4.22–5.81)
RDW: 22.9 % — ABNORMAL HIGH (ref 11.5–15.5)
Smear Review: NORMAL
WBC Count: 3.7 10*3/uL — ABNORMAL LOW (ref 4.0–10.5)
nRBC: 0 % (ref 0.0–0.2)

## 2023-11-12 NOTE — Progress Notes (Signed)
 HEMATOLOGY/ONCOLOGY CLINIC NOTE  Date of Service: 11/12/23    Patient Care Team: Patient, No Pcp Per as PCP - General (General Practice)  CHIEF COMPLAINTS/PURPOSE OF CONSULTATION:  Follow-up for continued evaluation and mx of Hodgkins lymphoma  HISTORY OF PRESENTING ILLNESS:   Timothy Hunter is a wonderful 63 y.o. male who has been referred to us  by Dr. 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   Patient presented to the ED on 03/09/2023 for GI bleed/acute blood loss anemia/microcytic anemia/low vitamin B12 levels and was seen by Dr. Odean.   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   Today, he is accompanied by his wife. He reports that he was recently seen in the hospital for anemia, with abdominal pain and black stools. His hgb was in 6s at the time. Patient received a CT scan of the abdomen and chest and was found to have several enlarged lymph glands in the center of his chest, including lung nodules and findings in the upper abdomen, bone, and thoracic spine. A biopsy of lymph node in retroperitoneum did show concerns for lymphoma.  Patient reports that he has otherwise been generally been pretty healthy otherwise. He notes taking medication for arthritis and reports a medical history of glaucoma. He denies any other chronic medical issues and does not take any medications on a regular basis otherwise. Patient denies having any surgeries in the past.   He reports that his lower back was evaluated earlier this year due to lower back pain, and there were not any concerning findings at that time. He denies any pain in the middle back. His back pain has improved recently.  His wife reports that his energy level has improved recently.   Patient does not smoke cigarettes at this time. He previously smoked 2 packs a day since he was 63 years old until about 2010. He reports that he rarely consumes alcohol and only consumes alcohol with social use. Patient does smoke marijuana.  There is a fhx of cancer. His wife reports that he has had family members that have passed away from cancer, but she is unsure of the type of cancer they had. His wife reports that patient's younger and older sister do have a history of cancer, and are in remission at this time. His older sister has breast cancer and breast cancer does run in the family. Patient denies any other medical issues in the family. He denies any blood disorders in the family.   Patient reports that he has not endorsed any black stools since being home from the hospital, and denies any other changes in bowel habits.   Patient's wife reports that patient did receive a blood transfusion while in the hospital, as well as potassium, but does not believe that patient received IV iron . His B12 level was also low at that time. He is not on any iron  or B12 replacement at this time.   He denies having any other symptoms at this time. Patient denies any lightheadedness/dizziness, SOB, chest pain, abdominal pain, or new leg swelling.  His wife reports that he did take one Tramadol  for back pain prevention. He did not have any back pain prior to taking it.   He reports that he has lost 12 pounds  over the course of about 3 months. Patient reports that his eating habits are normal at this time. He does drink two Ensure protein drinks a day and has gained 5-6 pounds recently. He reports that he was not put on any steroids recently.   Patient denies new lumps/bumps. His wife reports that patient has a scar on right forehead, which has been present for a while. He denies any swelling in the groin or testicular pain/swelling.   INTERVAL  HISTORY  Timothy Hunter is a 63 y.o. male here for continued evaluation and management of Hodgkins lymphoma.   Patient was last seen by me on 10/07/2023 and complained of loss of appetite, muscular foot cramping, and SOB sometimes.   He is accompanied by his wife during today's visit. Patient reports that he has been doing well overall since his last clinical visit.   He has been eating well and notes weight gain. Patient is noted to have gained 8 pounds in the last month with current weight os 162 pounds. Patient notes goal weight of 165 pounds.   Patient notes an incident where he scratched his port causing burning sensation for a couple of days. He notes that his port was evaluated at that time and there were no concerns.   He reports that he takes Pantoprazole  twice daily and notes that this was started at urgent care. He denies any acid reflux.   Patient denies any back pain, abdominal pain, skin rashes, lumps/bumps, fever, or chills. He reports mild night sweats which has improved from previously.   He notes that he is not taking remeron  at night for sleep.   Patient reports some erectile dysfunction issues. He reports that he has not seen his urologist for a while. Patient notes being on a generic version of viagra.   MEDICAL HISTORY:  Past Medical History:  Diagnosis Date   Dyspnea    Glaucoma associated with anomalies of iris     SURGICAL HISTORY: Past Surgical History:  Procedure Laterality Date   BIOPSY  03/11/2023   Procedure: BIOPSY;  Surgeon: Saintclair Jasper, MD;  Location: [facility];  Service: Gastroenterology;;   ESOPHAGOGASTRODUODENOSCOPY (EGD) WITH PROPOFOL  N/A 03/11/2023   Procedure: ESOPHAGOGASTRODUODENOSCOPY (EGD) WITH PROPOFOL ;  Surgeon: Saintclair Jasper, MD;  Location: [facility];  Service: Gastroenterology;  Laterality: N/A;   HOT HEMOSTASIS N/A 03/11/2023   Procedure: HOT HEMOSTASIS (ARGON PLASMA COAGULATION/BICAP);  Surgeon: Saintclair Jasper, MD;  Location: [facility];  Service: Gastroenterology;  Laterality: N/A;   IR IMAGING GUIDED PORT INSERTION  03/26/2023    SOCIAL HISTORY: Social History   Socioeconomic History   Marital status: Married    Spouse name: Not on file   Number of children: Not on file   Years of education: Not on file   Highest education level: Not on file  Occupational History   Not on file  Tobacco Use   Smoking status: Former    Passive exposure: Never   Smokeless tobacco: Not on file  Vaping Use   Vaping status: Never Used  Substance and Sexual Activity   Alcohol use: Yes    Comment: occasionally   Drug use: Yes    Types: Marijuana   Sexual activity: Not on file  Other Topics Concern   Not on file  Social History Narrative   Not on file   Social Drivers of Health   Financial Resource Strain: Not on file  Food Insecurity: No Food Insecurity (03/09/2023)   Hunger Vital Sign  Worried About Programme researcher, broadcasting/film/video in the Last Year: Never true    Ran Out of Food in the Last Year: Never true  Transportation Needs: No Transportation Needs (03/09/2023)   PRAPARE - Administrator, Civil Service (Medical): No    Lack of Transportation (Non-Medical): No  Physical Activity: Not on file  Stress: Not on file  Social Connections: Not on file  Intimate Partner Violence: Not At Risk (03/09/2023)   Humiliation, Afraid, Rape, and Kick questionnaire    Fear of Current or Ex-Partner: No    Emotionally Abused: No    Physically Abused: No    Sexually Abused: No    FAMILY HISTORY: No family history on file.  ALLERGIES:  has no known allergies.  MEDICATIONS:  Current Outpatient Medications  Medication Sig Dispense Refill   calcium-vitamin D (OSCAL WITH D) 500-5 MG-MCG tablet Take 1 tablet by mouth daily.     clotrimazole -betamethasone  (LOTRISONE ) cream Apply 1 Application topically 2 (two) times daily. To area of rash on shoulder and back. 30 g 1   Cyanocobalamin  (VITAMIN B-12 PO) Take 1 tablet by  mouth daily. Patient taking in liquid form     dexamethasone  (DECADRON ) 4 MG tablet Take 2 tablets by mouth once a day for 3 days after chemotherapy. Take with food. 30 tablet 1   feeding supplement (ENSURE ENLIVE / ENSURE PLUS) LIQD Take 237 mLs by mouth 2 (two) times daily between meals. 237 mL 12   HYDROcodone -acetaminophen  (NORCO/VICODIN) 5-325 MG tablet Take 1 tablet by mouth every 6 (six) hours as needed for severe pain (pain score 7-10). (Patient not taking: Reported on 09/22/2023) 5 tablet 0   latanoprost  (XALATAN ) 0.005 % ophthalmic solution Place 1 drop into both eyes at bedtime.     lidocaine -prilocaine  (EMLA ) cream Apply to affected area once 30 g 3   mirtazapine  (REMERON ) 15 MG tablet Take 1 tablet (15 mg total) by mouth at bedtime. 30 tablet 1   ondansetron  (ZOFRAN ) 8 MG tablet Take 1 tablet (8 mg total) by mouth every 8 (eight) hours as needed for nausea or vomiting. Start on the third day after chemotherapy. 30 tablet 1   pantoprazole  (PROTONIX ) 40 MG tablet Take 1 tablet (40 mg total) by mouth 2 (two) times daily before a meal. 60 tablet 2   prochlorperazine  (COMPAZINE ) 10 MG tablet Take 1 tablet (10 mg total) by mouth every 6 (six) hours as needed for nausea or vomiting. 30 tablet 1   timolol  (TIMOPTIC ) 0.5 % ophthalmic solution Place 1 drop into both eyes in the morning.     traMADol  (ULTRAM ) 50 MG tablet Take 1 tablet (50 mg total) by mouth every 6 (six) hours as needed. 30 tablet 0   No current facility-administered medications for this visit.    REVIEW OF SYSTEMS:    10 Point review of Systems was done is negative except as noted above.   PHYSICAL EXAMINATION: ECOG PERFORMANCE STATUS: 2 - Symptomatic, <50% confined to bed .BP 133/82   Pulse 68   Temp (!) 97.3 F (36.3 C)   Resp 20   Wt 162 lb 1.6 oz (73.5 kg)   SpO2 100%   BMI 23.94 kg/m  GENERAL:alert, in no acute distress and comfortable SKIN: no acute rashes, no significant lesions EYES: conjunctiva are pink  and non-injected, sclera anicteric OROPHARYNX: MMM, no exudates, no oropharyngeal erythema or ulceration NECK: supple, no JVD LYMPH:  no palpable lymphadenopathy in the cervical, axillary or inguinal regions LUNGS: clear  to auscultation b/l with normal respiratory effort HEART: regular rate & rhythm ABDOMEN:  normoactive bowel sounds , non tender, not distended. Extremity: no pedal edema PSYCH: alert & oriented x 3 with fluent speech NEURO: no focal motor/sensory deficits   LABORATORY DATA:  I have reviewed the data as listed  .    Latest Ref Rng & Units 10/07/2023   11:26 AM 09/22/2023   11:33 AM 09/08/2023   11:47 AM  CBC  WBC 4.0 - 10.5 K/uL 9.0  9.0  10.9   Hemoglobin 13.0 - 17.0 g/dL 8.8  8.1  8.4   Hematocrit 39.0 - 52.0 % 29.8  26.7  27.4   Platelets 150 - 400 K/uL 242  273  343     .    Latest Ref Rng & Units 10/07/2023   11:26 AM 09/22/2023   11:33 AM 09/08/2023   11:47 AM  CMP  Glucose 70 - 99 mg/dL 853  877  868   BUN 8 - 23 mg/dL 10  10  9    Creatinine 0.61 - 1.24 mg/dL 9.21  9.05  9.18   Sodium 135 - 145 mmol/L 137  137  137   Potassium 3.5 - 5.1 mmol/L 3.4  3.3  3.3   Chloride 98 - 111 mmol/L 103  103  102   CO2 22 - 32 mmol/L 24  26  27    Calcium 8.9 - 10.3 mg/dL 9.6  9.4  9.4   Total Protein 6.5 - 8.1 g/dL 7.5  7.2  7.3   Total Bilirubin 0.0 - 1.2 mg/dL 0.3  0.3  0.3   Alkaline Phos 38 - 126 U/L 70  73  77   AST 15 - 41 U/L 14  19  15    ALT 0 - 44 U/L 15  25  18     04/22/2023 right supraclavicular lymph node biopsy:         03/11/2023 biopsy of left retroperitoneal lymph node:     RADIOGRAPHIC STUDIES: I have personally reviewed the radiological images as listed and agreed with the findings in the report.  NM PET Image Restag (PS) Skull Base To Thigh Result Date: 11/04/2023 CLINICAL DATA:  Subsequent treatment strategy for Hodgkin's lymphoma. Six cycles of chemotherapy. EXAM: NUCLEAR MEDICINE PET SKULL BASE TO THIGH TECHNIQUE: 7.78 mCi F-18  FDG was injected intravenously. Full-ring PET imaging was performed from the skull base to thigh after the radiotracer. CT data was obtained and used for attenuation correction and anatomic localization. Fasting blood glucose: 111 mg/dl COMPARISON:  PET-CT 96/89/7974 FINDINGS: Mediastinal blood pool activity: SUV max 2.3 Liver activity: SUV max 2.4 NECK: There is some prominent uptake along the nasopharynx through the midline maximum SUV of 5.1. Slight soft tissue thickening in this location of mucosa. Please correlate with symptoms and if needed erect visualization. Otherwise no specific abnormal uptake seen including along lymph node change of the submandibular, posterior triangle or internal jugular region. Slight uptake along the pituitary gland sella turcica. Incidental CT findings: The parotid glands, submandibular glands and thyroid gland are unremarkable. Mild scattered vascular calcifications in the neck. Paranasal sinuses and mastoid air cells are clear. CHEST: No abnormal uptake along the lung parenchyma. There is some slight hilar uptake on the right maximum SUV value of 4.3. Previously this same area would have measured 3.9. Left-sided hilar uptake today of 3.2 and previously 2.9. Incidental CT findings: Right IJ chest port with tip extending to the SVC right atrial junction region. The heart  is nonenlarged. No pericardial effusion. Coronary artery calcifications are seen. Of note there is a mildly enlarged retrocrural lymph node of 9 mm in short axis on image 110. This does not show abnormal uptake. Previously this measured 9 mm as well. Breathing motion. No consolidation, pneumothorax or effusion. Few tiny lung nodules are stable. This includes a 4 mm right-sided nodule on image 84. ABDOMEN/PELVIS: There is physiologic distribution radiotracer along the parenchymal organs, bowel and renal collecting systems. Specifically no abnormal splenic uptake. Longitudinal length of the spleen approaches 7.7 cm.  No abnormal nodal uptake identified in the abdomen and pelvis. Incidental CT findings: On this limited CT examination, grossly the liver, adrenal glands, pancreas are unremarkable. Gallbladder is nondilated. Motion. No definite renal or ureter or ureteral stones. Scattered vascular calcifications. Normal caliber aorta and IVC. Large bowel has a normal course and caliber with scattered stool. Normal appendix. There is an enlarged left periaortic lymph node on image 132 measuring 12 by 17 mm. Previously this would have measured 13 x 19 mm. Smaller. Again no abnormal uptake. Maximum SUV of 1.8. SKELETON: Global marrow uptake identified which could be treatment effect. Once again there are scattered areas of bony sclerosis along the spine is similar distribution to previous. Some lucent areas as well in the lower thoracic region are also stable. Some of these areas are photopenic on the PET images, again similar to previous. Incidental CT findings: Scattered degenerative changes identified. IMPRESSION: Overall stable appearance to the previous examination. There is a very slight uptake along the hilum of the lungs bilaterally but no discrete nodal enlargement on the CT scan. There are a few mildly enlarged lymph nodes identified including retrocrural and left periaortic which do not show any abnormal uptake. Left para-aortic node is smaller in size slightly than the previous examination. Overall Deauville category 1. Stable areas of bony sclerosis and some lucencies along the spine. Patchy marrow uptake could be chemotherapy effect. Slight uptake in the midline along the nasopharynx. Please correlate for any symptoms and if needed direct visualization to confirm etiology. Electronically Signed   By: Ranell Bring M.D.   On: 11/04/2023 10:28      ASSESSMENT & PLAN:   63 y.o. male with:  Stage IV Hodgkins lymphoma CD30+ve s/p 6 cycles of A-AVD 2. 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 3. Hx of esophageal candidiasis 4.Back pain due to involvement by lymphoma -- pain now resolved. 5.  Protein calorie malnutrition-improving with lymphoma treatment has gained 7 pounds since his last visit . Wt Readings from Last 3 Encounters:  10/07/23 156 lb 3.2 oz (70.9 kg)  09/22/23 156 lb 1.6 oz (70.8 kg)  09/08/23 152 lb 6.4 oz (69.1 kg)   6.  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 CBC showed WBC of 3.7K, hemoglobin of 10.9, and platelets of 200K. -hgb nearly normal -WBCs normal -platelets normal -his recent PET scan from 10/27/2023 shows no concerns and patient is in remission. Overall Deauville category 1.  -reviewed his previous PET scan images with patient and his wife before and after treatment -discussed that his initial PET scan prior to treatment, from November 2024, showed plenty of masses in the abdomen, lymph nodes along the pelvis, along collarbone, activity center of chest, and tumor in bones.  -discussed option of port removal at this time or in 6 months -patient is inclined to remove  his port at this time -will plan for port-a-cath removal with interventional radiologist -discussed that once his port is removed, he needs to avoid water exposure to the area for about 1 month -recommend continuing to eat as well as he can with balanced diet -continue to stay physically active.  -okay to take Pantoprazole  once daily, and if he has no acid reflux, it would be okay to stop -stop nausea medications -will stop pain meds including Tramadol  -okay to take B12 replacement -there is no need for potassium and magnesium  supplementation with a balanced diet -recommend vitamin D3 supplementation 2000 units daily to help the immune system and bone support.  -there is no need for calcium replacement -we will continue to monitor -he shall return to clinic in 2-3 months -he shall then continue to  follow up with us  every 3 months for the first 2 years -will repeat CT scan sometime between 6-9 months -his urologist shall manage his erectile dysfunction issues -answered all of patient's and his wife's questions in detail  FOLLOW-UP: IR for port a cath removal in 1-2 weeks RTC with Dr Onesimo with labs in 3 months  The total time spent in the appointment was 32 minutes* .  All of the patient's questions were answered with apparent satisfaction. The patient knows to call the clinic with any problems, questions or concerns.   Emaline Onesimo MD MS AAHIVMS [facility] Hematology/Oncology Physician [facility]  .*Total Encounter Time as defined by the Centers for Medicare and Medicaid Services includes, in addition to the face-to-face time of a patient visit (documented in the note above) non-face-to-face time: obtaining and reviewing outside history, ordering and reviewing medications, tests or procedures, care coordination (communications with other health care professionals or caregivers) and documentation in the medical record.    I,Mitra Faeizi,acting as a Neurosurgeon for Emaline Onesimo, MD.,have documented all relevant documentation on the behalf of Emaline Onesimo, MD,as directed by  Emaline Onesimo, MD while in the presence of Emaline Onesimo, MD.  .I have reviewed the above documentation for accuracy and completeness, and I agree with the above. .Rhia Blatchford Kishore Phoenix Riesen MD

## 2023-11-23 ENCOUNTER — Encounter: Payer: Self-pay | Admitting: Hematology

## 2023-11-27 ENCOUNTER — Other Ambulatory Visit: Payer: Self-pay

## 2023-12-02 ENCOUNTER — Inpatient Hospital Stay (HOSPITAL_COMMUNITY)
Admission: RE | Admit: 2023-12-02 | Discharge: 2023-12-02 | Discharge status: Home / Self Care | Source: Ambulatory Visit | Attending: Hematology

## 2023-12-02 DIAGNOSIS — C8178 Other classical Hodgkin lymphoma, lymph nodes of multiple sites: Secondary | ICD-10-CM

## 2023-12-02 DIAGNOSIS — Z452 Encounter for adjustment and management of vascular access device: Secondary | ICD-10-CM | POA: Diagnosis present

## 2023-12-02 DIAGNOSIS — Z8571 Personal history of Hodgkin lymphoma: Secondary | ICD-10-CM | POA: Diagnosis not present

## 2023-12-02 HISTORY — PX: IR REMOVAL TUN ACCESS W/ PORT W/O FL MOD SED: IMG2290

## 2023-12-02 MED ORDER — LIDOCAINE-EPINEPHRINE 1 %-1:100000 IJ SOLN
INTRAMUSCULAR | Status: AC
  Filled 2023-12-02: qty 1

## 2023-12-02 MED ORDER — LIDOCAINE-EPINEPHRINE 1 %-1:100000 IJ SOLN
20.0000 mL | Freq: Once | INTRAMUSCULAR | Status: AC
  Administered 2023-12-02: 7 mL via INTRADERMAL

## 2023-12-02 NOTE — Procedures (Signed)
 Interventional Radiology Procedure:   Indications: History of Hodgkin lymphoma and no longer needs the port  Procedure: Port removal  Findings: Complete removal of the right chest port.   Complications: None     EBL: Minimal  Plan: Keep incision dry for 24 hours  Timothy Hunter R. Philip, MD  Pager: [phone]

## 2023-12-11 ENCOUNTER — Other Ambulatory Visit: Payer: Self-pay | Admitting: Urology

## 2023-12-22 NOTE — Progress Notes (Signed)
 11/12/23 follow up with Dr Onesimo. PET 10/27/23 shows remission of Lymphoma, will have Port a cath removed by interventional radiology. Will follow up in 2-3 months with Dr Onesimo, then every 3 months for 2 years. Will sign off.

## 2024-02-25 ENCOUNTER — Inpatient Hospital Stay: Attending: Hematology

## 2024-02-25 ENCOUNTER — Inpatient Hospital Stay: Admitting: Hematology

## 2024-02-25 VITALS — BP 136/95 | HR 57 | Temp 97.7°F | Resp 20 | Wt 164.1 lb

## 2024-02-25 DIAGNOSIS — K31811 Angiodysplasia of stomach and duodenum with bleeding: Secondary | ICD-10-CM | POA: Insufficient documentation

## 2024-02-25 DIAGNOSIS — C8198 Hodgkin lymphoma, unspecified, lymph nodes of multiple sites: Secondary | ICD-10-CM | POA: Diagnosis not present

## 2024-02-25 DIAGNOSIS — G629 Polyneuropathy, unspecified: Secondary | ICD-10-CM | POA: Diagnosis not present

## 2024-02-25 DIAGNOSIS — E46 Unspecified protein-calorie malnutrition: Secondary | ICD-10-CM | POA: Diagnosis not present

## 2024-02-25 DIAGNOSIS — C8338 Diffuse large B-cell lymphoma, lymph nodes of multiple sites: Secondary | ICD-10-CM | POA: Diagnosis present

## 2024-02-25 DIAGNOSIS — D62 Acute posthemorrhagic anemia: Secondary | ICD-10-CM | POA: Diagnosis not present

## 2024-02-25 DIAGNOSIS — H409 Unspecified glaucoma: Secondary | ICD-10-CM | POA: Diagnosis not present

## 2024-02-25 DIAGNOSIS — Z95828 Presence of other vascular implants and grafts: Secondary | ICD-10-CM

## 2024-02-25 DIAGNOSIS — Z87891 Personal history of nicotine dependence: Secondary | ICD-10-CM | POA: Diagnosis not present

## 2024-02-25 DIAGNOSIS — N529 Male erectile dysfunction, unspecified: Secondary | ICD-10-CM | POA: Insufficient documentation

## 2024-02-25 DIAGNOSIS — M1990 Unspecified osteoarthritis, unspecified site: Secondary | ICD-10-CM | POA: Insufficient documentation

## 2024-02-25 DIAGNOSIS — Z803 Family history of malignant neoplasm of breast: Secondary | ICD-10-CM | POA: Diagnosis not present

## 2024-02-25 DIAGNOSIS — C8178 Other classical Hodgkin lymphoma, lymph nodes of multiple sites: Secondary | ICD-10-CM

## 2024-02-25 DIAGNOSIS — Z79899 Other long term (current) drug therapy: Secondary | ICD-10-CM | POA: Insufficient documentation

## 2024-02-25 LAB — CBC WITH DIFFERENTIAL (CANCER CENTER ONLY)
Abs Immature Granulocytes: 0 K/uL (ref 0.00–0.07)
Basophils Absolute: 0 K/uL (ref 0.0–0.1)
Basophils Relative: 1 %
Eosinophils Absolute: 0.2 K/uL (ref 0.0–0.5)
Eosinophils Relative: 4 %
HCT: 38.6 % — ABNORMAL LOW (ref 39.0–52.0)
Hemoglobin: 12.5 g/dL — ABNORMAL LOW (ref 13.0–17.0)
Immature Granulocytes: 0 %
Lymphocytes Relative: 67 %
Lymphs Abs: 2.2 K/uL (ref 0.7–4.0)
MCH: 27.2 pg (ref 26.0–34.0)
MCHC: 32.4 g/dL (ref 30.0–36.0)
MCV: 84.1 fL (ref 80.0–100.0)
Monocytes Absolute: 0.3 K/uL (ref 0.1–1.0)
Monocytes Relative: 9 %
Neutro Abs: 0.7 K/uL — ABNORMAL LOW (ref 1.7–7.7)
Neutrophils Relative %: 19 %
Platelet Count: 183 K/uL (ref 150–400)
RBC: 4.59 MIL/uL (ref 4.22–5.81)
RDW: 18.3 % — ABNORMAL HIGH (ref 11.5–15.5)
WBC Count: 3.3 K/uL — ABNORMAL LOW (ref 4.0–10.5)
nRBC: 0 % (ref 0.0–0.2)

## 2024-02-25 NOTE — Progress Notes (Signed)
 HEMATOLOGY/ONCOLOGY CLINIC NOTE  Date of Service: 02/25/24    Patient Care Team: Patient, No Pcp Per as PCP - General (General Practice)  CHIEF COMPLAINTS/PURPOSE OF CONSULTATION:  Follow-up for continued evaluation and mx of Hodgkins lymphoma  HISTORY OF PRESENTING ILLNESS:  Timothy Hunter is a wonderful 63 y.o. male who has been referred to us  by Dr. 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   Patient presented to the ED on 03/09/2023 for GI bleed/acute blood loss anemia/microcytic anemia/low vitamin B12 levels and was seen by Dr. Odean.   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   Today, he is accompanied by his wife. He reports that he was recently seen in the hospital for anemia, with abdominal pain and black stools. His hgb was in 6s at the time. Patient received a CT scan of the abdomen and chest and was found to have several enlarged lymph glands in the center of his chest, including lung nodules and findings in the upper abdomen, bone, and thoracic spine. A biopsy of lymph node in retroperitoneum did show concerns for lymphoma.  Patient reports that he has otherwise been generally been pretty healthy otherwise. He notes taking medication for arthritis and reports a medical history of glaucoma. He denies any other chronic medical issues and does not take any medications on a regular basis otherwise. Patient denies having any surgeries in the past.   He reports that his lower back was evaluated earlier this year due to lower back pain, and there were not any concerning findings at that time. He denies any pain in the middle back. His back pain has improved recently.  His wife reports that his energy level has improved recently.   Patient does not smoke cigarettes at this time. He previously smoked 2 packs a day since he was 63 years old until about 2010. He reports that he rarely consumes alcohol and only consumes alcohol with social use. Patient does smoke marijuana.  There is a fhx of cancer. His wife reports that he has had family members that have passed away from cancer, but she is unsure of the type of cancer they had. His wife reports that patient's younger and older sister do have a history of cancer, and are in remission at this time. His older sister has breast cancer and breast cancer does run in the family. Patient denies any other medical issues in the family. He denies any blood disorders in the family.   Patient reports that he has not endorsed any black stools since being home from the hospital, and denies any other changes in bowel habits.   Patient's wife reports that patient did receive a blood transfusion while in the hospital, as well as potassium, but does not believe that patient received IV iron . His B12 level was also low at that time. He is not on any iron  or B12 replacement at this time.   He denies having any other symptoms at this time. Patient denies any lightheadedness/dizziness, SOB, chest pain, abdominal pain, or new leg swelling.  His wife reports that he did take one Tramadol  for back pain prevention. He did not have any back pain prior to taking it.   He reports that he has lost 12 pounds over  the course of about 3 months. Patient reports that his eating habits are normal at this time. He does drink two Ensure protein drinks a day and has gained 5-6 pounds recently. He reports that he was not put on any steroids recently.   Patient denies new lumps/bumps. His wife reports that patient has a scar on right forehead, which has been present for a while. He denies any swelling in the groin or testicular pain/swelling.   INTERVAL  HISTORY  Timothy Hunter is a 63 y.o. male here for continued evaluation and management of Hodgkins lymphoma.   Patient was last seen by me on 11/12/2023 and noted some erectile dysfunction issues, but was otherwise doing well.   Today, he says that he has been doing well, 6 months from his last treatment. He reports that his weight has since stabilized around the 160s. He denies new infection issues, fevers/chills, drenching night sweats, unexpected weight change, chest pain, abdominal pain, or leg swelling.   He does bring attention to a color change on his right nipple, but no discharge. No lumps  Says that he has not started any new medications and has actually discontinued his eye drops, Remeron , antiacids, or Ensure. Endorses compliance with Vitamin-D and B-complex.   Notes that he is not currently established with a PCP.  Additionally, says that he does not usually receive vaccinations.   MEDICAL HISTORY:  Past Medical History:  Diagnosis Date   Dyspnea    Glaucoma associated with anomalies of iris     SURGICAL HISTORY: Past Surgical History:  Procedure Laterality Date   BIOPSY  03/11/2023   Procedure: BIOPSY;  Surgeon: Saintclair Jasper, MD;  Location: [facility];  Service: Gastroenterology;;   ESOPHAGOGASTRODUODENOSCOPY (EGD) WITH PROPOFOL  N/A 03/11/2023   Procedure: ESOPHAGOGASTRODUODENOSCOPY (EGD) WITH PROPOFOL ;  Surgeon: Saintclair Jasper, MD;  Location: [facility];  Service: Gastroenterology;  Laterality: N/A;   HOT HEMOSTASIS N/A 03/11/2023   Procedure: HOT HEMOSTASIS (ARGON PLASMA COAGULATION/BICAP);  Surgeon: Saintclair Jasper, MD;  Location: [facility];  Service: Gastroenterology;  Laterality: N/A;   IR IMAGING GUIDED PORT INSERTION  03/26/2023   IR REMOVAL TUN ACCESS W/ PORT W/O FL MOD SED  12/02/2023    SOCIAL HISTORY: Social History   Socioeconomic History   Marital status: Married    Spouse name: Not on file   Number of children: Not on file   Years of education:  Not on file   Highest education level: Not on file  Occupational History   Not on file  Tobacco Use   Smoking status: Former    Passive exposure: Never   Smokeless tobacco: Not on file  Vaping Use   Vaping status: Never Used  Substance and Sexual Activity   Alcohol use: Yes    Comment: occasionally   Drug use: Yes    Types: Marijuana   Sexual activity: Not on file  Other Topics Concern   Not on file  Social History Narrative   Not on file   Social Drivers of Health   Financial Resource Strain: Not on file  Food Insecurity: No Food Insecurity (03/09/2023)   Hunger Vital Sign    Worried About Running Out of Food in the Last Year: Never true    Ran Out of Food in the Last Year: Never true  Transportation Needs: No Transportation Needs (03/09/2023)   PRAPARE - Administrator, Civil Service (Medical): No    Lack of Transportation (Non-Medical): No  Physical Activity: Not on file  Stress: Not on file  Social Connections: Not on file  Intimate Partner Violence: Not At Risk (03/09/2023)   Humiliation, Afraid, Rape, and Kick questionnaire    Fear of Current or Ex-Partner: No    Emotionally Abused: No    Physically Abused: No    Sexually Abused: No    FAMILY HISTORY: No family history on file.  ALLERGIES:  has no known allergies.  MEDICATIONS:  Current Outpatient Medications  Medication Sig Dispense Refill   calcium-vitamin D (OSCAL WITH D) 500-5 MG-MCG tablet Take 1 tablet by mouth daily.     clotrimazole -betamethasone  (LOTRISONE ) cream Apply 1 Application topically 2 (two) times daily. To area of rash on shoulder and back. 30 g 1   Cyanocobalamin  (VITAMIN B-12 PO) Take 1 tablet by mouth daily. Patient taking in liquid form     dexamethasone  (DECADRON ) 4 MG tablet Take 2 tablets by mouth once a day for 3 days after chemotherapy. Take with food. 30 tablet 1   feeding supplement (ENSURE ENLIVE / ENSURE PLUS) LIQD Take 237 mLs by mouth 2 (two) times daily between  meals. 237 mL 12   HYDROcodone -acetaminophen  (NORCO/VICODIN) 5-325 MG tablet Take 1 tablet by mouth every 6 (six) hours as needed for severe pain (pain score 7-10). (Patient not taking: Reported on 09/22/2023) 5 tablet 0   latanoprost  (XALATAN ) 0.005 % ophthalmic solution Place 1 drop into both eyes at bedtime.     lidocaine -prilocaine  (EMLA ) cream Apply to affected area once 30 g 3   mirtazapine  (REMERON ) 15 MG tablet Take 1 tablet (15 mg total) by mouth at bedtime. 30 tablet 1   ondansetron  (ZOFRAN ) 8 MG tablet Take 1 tablet (8 mg total) by mouth every 8 (eight) hours as needed for nausea or vomiting. Start on the third day after chemotherapy. 30 tablet 1   pantoprazole  (PROTONIX ) 40 MG tablet Take 1 tablet (40 mg total) by mouth 2 (two) times daily before a meal. 60 tablet 2   prochlorperazine  (COMPAZINE ) 10 MG tablet Take 1 tablet (10 mg total) by mouth every 6 (six) hours as needed for nausea or vomiting. 30 tablet 1   timolol  (TIMOPTIC ) 0.5 % ophthalmic solution Place 1 drop into both eyes in the morning.     traMADol  (ULTRAM ) 50 MG tablet Take 1 tablet (50 mg total) by mouth every 6 (six) hours as needed. 30 tablet 0   No current facility-administered medications for this visit.    REVIEW OF SYSTEMS:    10 Point review of Systems was done is negative except as noted above.   PHYSICAL EXAMINATION: ECOG PERFORMANCE STATUS: 1 - Symptomatic but completely ambulatory .BP (!) 136/95   Pulse (!) 57   Temp 97.7 F (36.5 C)   Resp 20   Wt 164 lb 1.6 oz (74.4 kg)   SpO2 100%   BMI 24.23 kg/m  GENERAL:alert, in no acute distress and comfortable SKIN: no acute rashes, no significant lesions EYES: conjunctiva are pink and non-injected, sclera anicteric OROPHARYNX: MMM, no exudates, no oropharyngeal erythema or ulceration NECK: supple, no JVD LYMPH:  no palpable lymphadenopathy in the cervical, axillary or inguinal regions LUNGS: clear to auscultation b/l with normal respiratory  effort HEART: regular rate & rhythm ABDOMEN:  normoactive bowel sounds , non tender, not distended. Extremity: no pedal edema PSYCH: alert & oriented x 3 with fluent speech NEURO: no focal motor/sensory deficits   LABORATORY DATA:  I have reviewed the data as listed  .    Latest Ref Rng &  Units 02/25/2024   12:09 PM 11/12/2023   12:10 PM 10/07/2023   11:26 AM  CBC EXTENDED  WBC 4.0 - 10.5 K/uL 3.3  3.7  9.0   RBC 4.22 - 5.81 MIL/uL 4.59  3.98  3.39   Hemoglobin 13.0 - 17.0 g/dL 87.4  89.0  8.8   HCT 60.9 - 52.0 % 38.6  36.4  29.8   Platelets 150 - 400 K/uL 183  200  242   NEUT# 1.7 - 7.7 K/uL 0.7  1.1  6.5   Lymph# 0.7 - 4.0 K/uL 2.2  1.8  1.6       Latest Ref Rng & Units 10/07/2023   11:26 AM 09/22/2023   11:33 AM 09/08/2023   11:47 AM  CMP  Glucose 70 - 99 mg/dL 853  877  868   BUN 8 - 23 mg/dL 10  10  9    Creatinine 0.61 - 1.24 mg/dL 9.21  9.05  9.18   Sodium 135 - 145 mmol/L 137  137  137   Potassium 3.5 - 5.1 mmol/L 3.4  3.3  3.3   Chloride 98 - 111 mmol/L 103  103  102   CO2 22 - 32 mmol/L 24  26  27    Calcium 8.9 - 10.3 mg/dL 9.6  9.4  9.4   Total Protein 6.5 - 8.1 g/dL 7.5  7.2  7.3   Total Bilirubin 0.0 - 1.2 mg/dL 0.3  0.3  0.3   Alkaline Phos 38 - 126 U/L 70  73  77   AST 15 - 41 U/L 14  19  15    ALT 0 - 44 U/L 15  25  18     04/22/2023 right supraclavicular lymph node biopsy:         03/11/2023 biopsy of left retroperitoneal lymph node:     RADIOGRAPHIC STUDIES: I have personally reviewed the radiological images as listed and agreed with the findings in the report. No results found.     ASSESSMENT & PLAN:   63 y.o. male with:  Stage IV Hodgkins lymphoma CD30+ve s/p 6 cycles of A-AVD 2. 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 3. Hx of esophageal candidiasis 4.Back pain due to involvement by lymphoma -- pain now resolved. 5.  Protein calorie malnutrition-improving with lymphoma treatment has gained 7 pounds  since his last visit . Wt Readings from Last 3 Encounters:  11/12/23 162 lb 1.6 oz (73.5 kg)  10/07/23 156 lb 3.2 oz (70.9 kg)  09/22/23 156 lb 1.6 oz (70.8 kg)   6.  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 Patient is in remission. Overall Deauville category 1. We will continue to monitor at this time.  CMP stable.   - Vaccine counseling provided: recommend updating age-appropriate vaccinations (PNA, Shingrix, Covid-19) due to immunocompromised status.   Additionally advised to avoid crowds when possible and wear a mask when unavoidable.  - Recommended establishing with PCP in [place].  FOLLOW-UP: RTC with Dr Onesimo with labs in 2 months  .The total time spent in the appointment was 25 minutes* .  All of the patient's questions were answered with apparent satisfaction. The patient knows to call the clinic with any problems, questions or concerns.   Emaline Onesimo MD MS AAHIVMS [facility] Hematology/Oncology Physician [facility]  .*Total Encounter Time as defined by the Centers for Medicare and Medicaid Services includes, in addition to the face-to-face time of a patient visit (documented in the note above) non-face-to-face time: obtaining and reviewing outside history, ordering and reviewing medications, tests or procedures, care coordination (communications with other health care professionals or caregivers) and documentation in the medical record.   I,  Damien Lagle,acting as a scribe for Emaline Saran, MD.,have documented all relevant documentation on the behalf of Emaline Saran, MD,as directed by  Emaline Saran, MD while in the presence of Emaline Saran, MD.  I have reviewed the above documentation for accuracy and completeness, and I agree with the above. Emaline Candida Saran MD.

## 2024-02-27 ENCOUNTER — Other Ambulatory Visit: Payer: Self-pay

## 2024-03-03 ENCOUNTER — Encounter: Payer: Self-pay | Admitting: Hematology

## 2024-03-21 ENCOUNTER — Other Ambulatory Visit: Payer: Self-pay

## 2024-04-19 ENCOUNTER — Ambulatory Visit

## 2024-04-19 VITALS — BP 131/88 | HR 74 | Temp 97.9°F | Resp 16 | Ht 69.0 in | Wt 161.0 lb

## 2024-04-19 DIAGNOSIS — Z13228 Encounter for screening for other metabolic disorders: Secondary | ICD-10-CM

## 2024-04-19 DIAGNOSIS — Z7689 Persons encountering health services in other specified circumstances: Secondary | ICD-10-CM

## 2024-04-19 DIAGNOSIS — Z131 Encounter for screening for diabetes mellitus: Secondary | ICD-10-CM

## 2024-04-19 DIAGNOSIS — Z13220 Encounter for screening for lipoid disorders: Secondary | ICD-10-CM

## 2024-04-19 DIAGNOSIS — Z862 Personal history of diseases of the blood and blood-forming organs and certain disorders involving the immune mechanism: Secondary | ICD-10-CM

## 2024-04-19 DIAGNOSIS — Z23 Encounter for immunization: Secondary | ICD-10-CM

## 2024-04-19 DIAGNOSIS — Z130 Encounter for screening for diseases of the blood and blood-forming organs and certain disorders involving the immune mechanism: Secondary | ICD-10-CM

## 2024-04-19 NOTE — Progress Notes (Signed)
     Patient ID: Timothy Hunter, male    DOB: [birth year]  MRN: [id number]  CC: Establish Care   Subjective: Timothy Hunter is a 63 y.o. male with past medical history of lymphoma who presents to clinic to establish care. Feeling well overall, no acute concerns.  Was advised to receive all recommended immunizations per hematology team.  Last PCE: >4 years ago  No Known Allergies  ROS: Review of Systems Negative except as stated above  PHYSICAL EXAM: BP 131/88   Pulse 74   Temp 97.9 F (36.6 C)   Resp 16   Ht 5' 9 (1.753 m)   Wt 161 lb (73 kg)   SpO2 98%   BMI 23.78 kg/m   Physical Exam  General: well-appearing, no acute distress Skin: no jaundice, rashes, or lesions Cardiovascular: regular heart rate and rhythm, normal S1/S2, no murmurs, gallops, or rubs, peripheral pulses 2+ bilaterally Chest: no skeletal deformity, lungs clear to auscultation bilaterally, equal breath sounds bilaterally Musculoskeletal: normal gait Extremities: no peripheral edema  ASSESSMENT AND PLAN:  1. Encounter to establish care with new provider (Primary) - Given history of anemia and B12 supplementation will draw labs to assess - Patient doing well today, no acute concerns  2. Encounter for administration of vaccine - Flu vaccine trivalent PF, 6mos and older(Flulaval,Afluria,Fluarix,Fluzone)  3. Encounter for lipid screening for cardiovascular disease - Lipid panel  4. Diabetes mellitus screening - Hemoglobin A1c - Diabetes is listed as a prior diagnoses.  Patient denies being aware of this, will check A1c to evaluate.  5. Screening for blood disease - CBC  6. Screening for metabolic disorder - Comprehensive metabolic panel with GFR  7. History of anemia - CBC - B12 and Folate Panel    Patient was given the opportunity to ask questions.  Patient verbalized understanding of the plan and was able to repeat key elements of the plan.    Orders Placed This Encounter   Procedures   Flu vaccine trivalent PF, 6mos and older(Flulaval,Afluria,Fluarix,Fluzone)   Varicella-zoster vaccine IM   Flu vaccine trivalent PF, 6mos and older(Flulaval,Afluria,Fluarix,Fluzone)   Hemoglobin A1c   CBC   Comprehensive metabolic panel with GFR   Lipid panel   B12 and Folate Panel     Requested Prescriptions    No prescriptions requested or ordered in this encounter    Return in about 1 month (around 05/20/2024) for physical.  Sula Leavy Rode, PA-C

## 2024-04-20 ENCOUNTER — Other Ambulatory Visit: Payer: Self-pay

## 2024-04-20 ENCOUNTER — Encounter: Payer: Self-pay | Admitting: Hematology

## 2024-04-20 LAB — COMPREHENSIVE METABOLIC PANEL WITH GFR
ALT: 20 IU/L (ref 0–44)
AST: 22 IU/L (ref 0–40)
Albumin: 4.6 g/dL (ref 3.9–4.9)
Alkaline Phosphatase: 67 IU/L (ref 47–123)
BUN/Creatinine Ratio: 12 (ref 10–24)
BUN: 12 mg/dL (ref 8–27)
Bilirubin Total: 0.3 mg/dL (ref 0.0–1.2)
CO2: 25 mmol/L (ref 20–29)
Calcium: 9.2 mg/dL (ref 8.6–10.2)
Chloride: 106 mmol/L (ref 96–106)
Creatinine, Ser: 1.01 mg/dL (ref 0.76–1.27)
Globulin, Total: 2.7 g/dL (ref 1.5–4.5)
Glucose: 68 mg/dL — ABNORMAL LOW (ref 70–99)
Potassium: 3.9 mmol/L (ref 3.5–5.2)
Sodium: 143 mmol/L (ref 134–144)
Total Protein: 7.3 g/dL (ref 6.0–8.5)
eGFR: 84 mL/min/1.73 (ref >59)

## 2024-04-20 LAB — HEMOGLOBIN A1C
Est. average glucose Bld gHb Est-mCnc: 123 mg/dL
Hgb A1c MFr Bld: 5.9 % — ABNORMAL HIGH (ref 4.8–5.6)

## 2024-04-20 LAB — CBC
Hematocrit: 43.7 % (ref 37.5–51.0)
Hemoglobin: 13.2 g/dL (ref 13.0–17.7)
MCH: 28 pg (ref 26.6–33.0)
MCHC: 30.2 g/dL — ABNORMAL LOW (ref 31.5–35.7)
MCV: 93 fL (ref 79–97)
Platelets: 182 x10E3/uL (ref 150–450)
RBC: 4.71 x10E6/uL (ref 4.14–5.80)
RDW: 16.7 % — ABNORMAL HIGH (ref 11.6–15.4)
WBC: 2.6 x10E3/uL — ABNORMAL LOW (ref 3.4–10.8)

## 2024-04-20 LAB — LIPID PANEL
Chol/HDL Ratio: 4 ratio (ref 0.0–5.0)
Cholesterol, Total: 197 mg/dL (ref 100–199)
HDL: 49 mg/dL (ref >39)
LDL Chol Calc (NIH): 124 mg/dL — ABNORMAL HIGH (ref 0–99)
Triglycerides: 136 mg/dL (ref 0–149)
VLDL Cholesterol Cal: 24 mg/dL (ref 5–40)

## 2024-04-21 LAB — B12 AND FOLATE PANEL
Folate: 8.2 ng/mL (ref >3.0)
Vitamin B-12: 421 pg/mL (ref 232–1245)

## 2024-04-22 ENCOUNTER — Ambulatory Visit: Payer: Self-pay

## 2024-04-27 ENCOUNTER — Other Ambulatory Visit: Payer: Self-pay

## 2024-04-27 DIAGNOSIS — C8198 Hodgkin lymphoma, unspecified, lymph nodes of multiple sites: Secondary | ICD-10-CM

## 2024-04-28 ENCOUNTER — Inpatient Hospital Stay: Attending: Hematology

## 2024-04-28 ENCOUNTER — Inpatient Hospital Stay: Admitting: Hematology

## 2024-04-28 VITALS — BP 119/78 | HR 78 | Temp 97.9°F | Resp 17 | Wt 161.1 lb

## 2024-04-28 DIAGNOSIS — C8198 Hodgkin lymphoma, unspecified, lymph nodes of multiple sites: Secondary | ICD-10-CM | POA: Diagnosis not present

## 2024-04-28 DIAGNOSIS — Z87891 Personal history of nicotine dependence: Secondary | ICD-10-CM | POA: Insufficient documentation

## 2024-04-28 DIAGNOSIS — K31811 Angiodysplasia of stomach and duodenum with bleeding: Secondary | ICD-10-CM | POA: Diagnosis not present

## 2024-04-28 DIAGNOSIS — C8190 Hodgkin lymphoma, unspecified, unspecified site: Secondary | ICD-10-CM | POA: Insufficient documentation

## 2024-04-28 DIAGNOSIS — C8338 Diffuse large B-cell lymphoma, lymph nodes of multiple sites: Secondary | ICD-10-CM | POA: Diagnosis present

## 2024-04-28 DIAGNOSIS — Z803 Family history of malignant neoplasm of breast: Secondary | ICD-10-CM | POA: Insufficient documentation

## 2024-04-28 DIAGNOSIS — D62 Acute posthemorrhagic anemia: Secondary | ICD-10-CM | POA: Diagnosis not present

## 2024-04-28 DIAGNOSIS — M1990 Unspecified osteoarthritis, unspecified site: Secondary | ICD-10-CM | POA: Insufficient documentation

## 2024-04-28 DIAGNOSIS — M545 Low back pain, unspecified: Secondary | ICD-10-CM | POA: Diagnosis not present

## 2024-04-28 DIAGNOSIS — G629 Polyneuropathy, unspecified: Secondary | ICD-10-CM | POA: Insufficient documentation

## 2024-04-28 DIAGNOSIS — Z79899 Other long term (current) drug therapy: Secondary | ICD-10-CM | POA: Insufficient documentation

## 2024-04-28 DIAGNOSIS — R109 Unspecified abdominal pain: Secondary | ICD-10-CM | POA: Diagnosis not present

## 2024-04-28 DIAGNOSIS — E46 Unspecified protein-calorie malnutrition: Secondary | ICD-10-CM | POA: Insufficient documentation

## 2024-04-28 DIAGNOSIS — R918 Other nonspecific abnormal finding of lung field: Secondary | ICD-10-CM | POA: Diagnosis not present

## 2024-04-28 DIAGNOSIS — H409 Unspecified glaucoma: Secondary | ICD-10-CM | POA: Insufficient documentation

## 2024-04-28 LAB — CBC WITH DIFFERENTIAL (CANCER CENTER ONLY)
Abs Immature Granulocytes: 0 K/uL (ref 0.00–0.07)
Basophils Absolute: 0 K/uL (ref 0.0–0.1)
Basophils Relative: 1 %
Eosinophils Absolute: 0.1 K/uL (ref 0.0–0.5)
Eosinophils Relative: 3 %
HCT: 42.9 % (ref 39.0–52.0)
Hemoglobin: 13.9 g/dL (ref 13.0–17.0)
Immature Granulocytes: 0 %
Lymphocytes Relative: 72 %
Lymphs Abs: 2.3 K/uL (ref 0.7–4.0)
MCH: 28.1 pg (ref 26.0–34.0)
MCHC: 32.4 g/dL (ref 30.0–36.0)
MCV: 86.7 fL (ref 80.0–100.0)
Monocytes Absolute: 0.2 K/uL (ref 0.1–1.0)
Monocytes Relative: 7 %
Neutro Abs: 0.5 K/uL — ABNORMAL LOW (ref 1.7–7.7)
Neutrophils Relative %: 17 %
Platelet Count: 187 K/uL (ref 150–400)
RBC: 4.95 MIL/uL (ref 4.22–5.81)
RDW: 18.2 % — ABNORMAL HIGH (ref 11.5–15.5)
Smear Review: NORMAL
WBC Count: 3.1 K/uL — ABNORMAL LOW (ref 4.0–10.5)
nRBC: 0 % (ref 0.0–0.2)

## 2024-04-28 LAB — CMP (CANCER CENTER ONLY)
ALT: 21 U/L (ref 0–44)
AST: 30 U/L (ref 15–41)
Albumin: 4.6 g/dL (ref 3.5–5.0)
Alkaline Phosphatase: 60 U/L (ref 38–126)
Anion gap: 11 (ref 5–15)
BUN: 12 mg/dL (ref 8–23)
CO2: 25 mmol/L (ref 22–32)
Calcium: 10 mg/dL (ref 8.9–10.3)
Chloride: 104 mmol/L (ref 98–111)
Creatinine: 1 mg/dL (ref 0.61–1.24)
GFR, Estimated: >60 mL/min (ref >60)
Glucose, Bld: 121 mg/dL — ABNORMAL HIGH (ref 70–99)
Potassium: 3.8 mmol/L (ref 3.5–5.1)
Sodium: 140 mmol/L (ref 135–145)
Total Bilirubin: 0.4 mg/dL (ref 0.0–1.2)
Total Protein: 8.3 g/dL — ABNORMAL HIGH (ref 6.5–8.1)

## 2024-04-28 NOTE — Progress Notes (Signed)
 HEMATOLOGY ONCOLOGY PROGRESS NOTE  Date of service: 04/28/2024  Patient Care Team: Leavy Rode, Sula RIGGERS as PCP - General (Physician Assistant)  CHIEF COMPLAINT/PURPOSE OF CONSULTATION: Follow-up for continued evaluation and management of Hodgkins lymphoma   HISTORY OF PRESENTING ILLNESS: Timothy Hunter is a wonderful 63 y.o. male who has been referred to us  by Dr. 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    Patient presented to the ED on 03/09/2023 for GI bleed/acute blood loss anemia/microcytic anemia/low vitamin B12 levels and was seen by Dr. Odean.    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    Today, he is accompanied by his wife. He reports that he was recently seen in the hospital for anemia, with abdominal pain and black stools. His hgb was in 6s at the time. Patient received a CT scan of the abdomen and chest and was found to have several enlarged lymph glands in the center of his chest, including lung nodules and findings in the upper abdomen, bone, and thoracic spine. A biopsy of lymph node in retroperitoneum did show concerns for lymphoma.   Patient reports that he has otherwise been generally been pretty healthy otherwise. He notes taking medication for arthritis and reports a medical history of glaucoma. He denies any other chronic medical issues and does not take any medications on a regular basis otherwise. Patient denies having any surgeries in the past.    He reports that his lower back was evaluated earlier this year due to lower back pain, and there were not any concerning findings at that time. He denies any pain in the middle back. His back pain has  improved recently. His wife reports that his energy level has improved recently.    Patient does not smoke cigarettes at this time. He previously smoked 2 packs a day since he was 63 years old until about 2010. He reports that he rarely consumes alcohol and only consumes alcohol with social use. Patient does smoke marijuana.   There is a fhx of cancer. His wife reports that he has had family members that have passed away from cancer, but she is unsure of the type of cancer they had. His wife reports that patient's younger and older sister do have a history of cancer, and are in remission at this time. His older sister has breast cancer and breast cancer does run in the family. Patient denies any other medical issues in the family. He denies any blood disorders in the family.    Patient reports that he has not endorsed any black stools since being home from the hospital, and denies any other changes in bowel habits.    Patient's wife reports that patient did receive a blood transfusion while in the hospital, as well as potassium, but does not believe that patient received IV iron . His B12 level was also low at that time. He is not on any iron  or B12 replacement at this time.    He denies having any other symptoms at this time. Patient denies any lightheadedness/dizziness, SOB, chest pain, abdominal pain, or new leg swelling.   His wife reports that he did take one Tramadol  for back pain prevention. He did not have any back pain prior to taking it.    He  reports that he has lost 12 pounds over the course of about 3 months. Patient reports that his eating habits are normal at this time. He does drink two Ensure protein drinks a day and has gained 5-6 pounds recently. He reports that he was not put on any steroids recently.    Patient denies new lumps/bumps. His wife reports that patient has a scar on right forehead, which has been present for a while. He denies any swelling in the groin or testicular  pain/swelling.     SUMMARY OF ONCOLOGIC HISTORY: Oncology History  Hodgkin's lymphoma (HCC)  04/23/2023 Initial Diagnosis   Hodgkin's lymphoma (HCC)   04/30/2023 -  Chemotherapy   Patient is on Treatment Plan : HODGKINS LYMPHOMA  A + AVD q28d       INTERVAL HISTORY: Timothy Hunter is a 63 y.o. male who is here today for continued evaluation and management of Hodgkins lymphoma . {ELcompanions/ambulations (Optional):33762}  he was last seen by me on 02/25/2024; at the time he mentioned experiencing discoloration in his right nipple.  Today, he is feeling well overall. He notes that he has a raised lump on his right upper back.   Denies bone pain and back pain.  He does not smoke and will have a casual drink on occasion.   REVIEW OF SYSTEMS:   10 Point review of systems of done and is negative except as noted above.  MEDICAL HISTORY Past Medical History:  Diagnosis Date   Dyspnea    Glaucoma associated with anomalies of iris     SURGICAL HISTORY Past Surgical History:  Procedure Laterality Date   BIOPSY  03/11/2023   Procedure: BIOPSY;  Surgeon: Saintclair Jasper, MD;  Location: [facility];  Service: Gastroenterology;;   ESOPHAGOGASTRODUODENOSCOPY (EGD) WITH PROPOFOL  N/A 03/11/2023   Procedure: ESOPHAGOGASTRODUODENOSCOPY (EGD) WITH PROPOFOL ;  Surgeon: Saintclair Jasper, MD;  Location: [facility];  Service: Gastroenterology;  Laterality: N/A;   HOT HEMOSTASIS N/A 03/11/2023   Procedure: HOT HEMOSTASIS (ARGON PLASMA COAGULATION/BICAP);  Surgeon: Saintclair Jasper, MD;  Location: [facility];  Service: Gastroenterology;  Laterality: N/A;   IR IMAGING GUIDED PORT INSERTION  03/26/2023   IR REMOVAL TUN ACCESS W/ PORT W/O FL MOD SED  12/02/2023    SOCIAL HISTORY Social History[1]  Social History   Social History Narrative   Not on file    SOCIAL DRIVERS OF HEALTH SDOH Screenings   Food Insecurity: No Food Insecurity (03/09/2023)  Housing: Low Risk (03/09/2023)   Transportation Needs: No Transportation Needs (03/09/2023)  Utilities: Not At Risk (03/09/2023)  Alcohol Screen: Low Risk (04/19/2024)  Depression (PHQ2-9): Low Risk (04/19/2024)  Financial Resource Strain: Low Risk (04/19/2024)  Physical Activity: Insufficiently Active (04/19/2024)  Stress: No Stress Concern Present (04/19/2024)  Tobacco Use: Medium Risk (04/19/2024)  Health Literacy: Adequate Health Literacy (04/19/2024)     FAMILY HISTORY No family history on file.   ALLERGIES: has no known allergies.  MEDICATIONS  Current Outpatient Medications  Medication Sig Dispense Refill   calcium-vitamin D (OSCAL WITH D) 500-5 MG-MCG tablet Take 1 tablet by mouth daily.     clotrimazole -betamethasone  (LOTRISONE ) cream Apply 1 Application topically 2 (two) times daily. To area of rash on shoulder and back. 30 g 1   Cyanocobalamin  (VITAMIN B-12 PO) Take 1 tablet by mouth daily. Patient taking in liquid form     feeding supplement (ENSURE ENLIVE / ENSURE PLUS) LIQD Take 237 mLs by mouth 2 (two) times daily between meals. 237 mL 12   latanoprost  (XALATAN )  0.005 % ophthalmic solution Place 1 drop into both eyes at bedtime.     timolol  (TIMOPTIC ) 0.5 % ophthalmic solution Place 1 drop into both eyes in the morning.     No current facility-administered medications for this visit.    PHYSICAL EXAMINATION: ECOG PERFORMANCE STATUS: 0 - Asymptomatic VITALS: Vitals:   04/28/24 1152  BP: 119/78  Pulse: 78  Resp: 17  Temp: 97.9 F (36.6 C)  SpO2: 100%   Filed Weights   04/28/24 1152  Weight: 161 lb 1.6 oz (73.1 kg)   Body mass index is 23.79 kg/m.  GENERAL: alert, in no acute distress and comfortable SKIN: no acute rashes, no significant lesions EYES: conjunctiva are pink and non-injected, sclera anicteric OROPHARYNX: MMM, no exudates, no oropharyngeal erythema or ulceration NECK: supple, no JVD LYMPH:  no palpable lymphadenopathy in the cervical, axillary or inguinal regions LUNGS:  clear to auscultation b/l with normal respiratory effort HEART: regular rate & rhythm ABDOMEN:  normoactive bowel sounds , non tender, not distended, no hepatosplenomegaly Extremity: no pedal edema PSYCH: alert & oriented x 3 with fluent speech NEURO: no focal motor/sensory deficits  LABORATORY DATA:   I have reviewed the data as listed     Latest Ref Rng & Units 04/28/2024   10:56 AM 04/19/2024    9:11 AM 02/25/2024   12:09 PM  CBC EXTENDED  WBC 4.0 - 10.5 K/uL 3.1  2.6  3.3   RBC 4.22 - 5.81 MIL/uL 4.95  4.71  4.59   Hemoglobin 13.0 - 17.0 g/dL 86.0  86.7  87.4   HCT 39.0 - 52.0 % 42.9  43.7  38.6   Platelets 150 - 400 K/uL 187  182  183   NEUT# 1.7 - 7.7 K/uL 0.5   0.7   Lymph# 0.7 - 4.0 K/uL 2.3   2.2         Latest Ref Rng & Units 04/28/2024   10:56 AM 04/19/2024    9:11 AM 10/07/2023   11:26 AM  CMP  Glucose 70 - 99 mg/dL 878  68  853   BUN 8 - 23 mg/dL 12  12  10    Creatinine 0.61 - 1.24 mg/dL 8.99  8.98  9.21   Sodium 135 - 145 mmol/L 140  143  137   Potassium 3.5 - 5.1 mmol/L 3.8  3.9  3.4   Chloride 98 - 111 mmol/L 104  106  103   CO2 22 - 32 mmol/L 25  25  24    Calcium 8.9 - 10.3 mg/dL 89.9  9.2  9.6   Total Protein 6.5 - 8.1 g/dL 8.3  7.3  7.5   Total Bilirubin 0.0 - 1.2 mg/dL 0.4  0.3  0.3   Alkaline Phos 38 - 126 U/L 60  67  70   AST 15 - 41 U/L 30  22  14    ALT 0 - 44 U/L 21  20  15     Lipid Panel Component     Latest Ref Rng 04/19/2024  Cholesterol, Total     100 - 199 mg/dL 802   Triglycerides     0 - 149 mg/dL 863   HDL Cholesterol     >39 mg/dL 49   VLDL Cholesterol Cal     5 - 40 mg/dL 24   LDL Chol Calc (NIH)     0 - 99 mg/dL 875 (H)   Total CHOL/HDL Ratio     0.0 - 5.0 ratio 4.0  Legend: (H) High  B12 and Folate Component     Latest Ref Rng 04/19/2024  Folate     >3.0 ng/mL 8.2   Vitamin B12     232 - 1,245 pg/mL 421    Hemoglobin A1C Component     Latest Ref Rng 04/19/2024  Hemoglobin A1C     4.8 - 5.6 % 5.9 (H)   Est.  average glucose Bld gHb Est-mCnc     mg/dL 876     Legend: (H) High  04/22/2023 right supraclavicular lymph node biopsy:             03/11/2023 biopsy of left retroperitoneal lymph node:       RADIOGRAPHIC STUDIES: I have personally reviewed the radiological images as listed and agreed with the findings in the report. No results found.  ASSESSMENT & PLAN:  63 y.o. male with  Stage IV Hodgkins lymphoma CD30+ve s/p 6 cycles of A-AVD 2. 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 3. Hx of esophageal candidiasis 4.Back pain due to involvement by lymphoma -- pain now resolved. 5.  Protein calorie malnutrition-improving with lymphoma treatment  6.  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 Onesimo.  The total time spent in the appointment was *** minutes* .  All of the patient's questions were answered and the patient knows to call the clinic with any problems, questions, or concerns.  Emaline Onesimo MD MS AAHIVMS [facility] Hematology/Oncology Physician [facility]  *Total Encounter Time as defined by the Centers for Medicare and Medicaid Services includes, in addition to the face-to-face time of a patient visit (documented in the note above) non-face-to-face time: obtaining and reviewing outside history, ordering and reviewing medications, tests or procedures, care coordination (communications with other health care professionals or caregivers) and documentation in the medical record.  I, Marijo Sharps, acting as a neurosurgeon for Emaline Onesimo, MD.,have documented all relevant  documentation on the behalf of Emaline Onesimo, MD,as directed by  Emaline Onesimo, MD while in the presence of Emaline Onesimo, MD.  I have reviewed the above documentation for accuracy and completeness, and I agree with the above.  Emaline Onesimo, MD      [1]  Social History Tobacco Use   Smoking status: Former    Passive exposure: Never  Vaping Use   Vaping status: Never Used  Substance Use Topics   Alcohol use: Yes    Comment: occasionally   Drug use: Yes    Types: Marijuana

## 2024-04-29 ENCOUNTER — Telehealth: Payer: Self-pay | Admitting: Hematology

## 2024-04-29 NOTE — Telephone Encounter (Signed)
 Scheduled patient for 3 month labs and follow-up. Called and left a voicemail with the appointment details.

## 2024-04-30 ENCOUNTER — Other Ambulatory Visit: Payer: Self-pay

## 2024-05-04 ENCOUNTER — Encounter: Payer: Self-pay | Admitting: Hematology

## 2024-05-25 ENCOUNTER — Ambulatory Visit (INDEPENDENT_AMBULATORY_CARE_PROVIDER_SITE_OTHER)

## 2024-05-25 VITALS — BP 117/77 | HR 63 | Temp 98.3°F | Resp 16 | Ht 66.0 in | Wt 156.6 lb

## 2024-05-25 DIAGNOSIS — Z0000 Encounter for general adult medical examination without abnormal findings: Secondary | ICD-10-CM

## 2024-05-25 NOTE — Progress Notes (Signed)
 "    Patient ID: Timothy Hunter, male    DOB: [birth year]  MRN: [id number]  CC: Annual Exam (-Patient is here to have annually  complete physical examination /-Care gap address/-labs taken )   Subjective: Timothy Hunter is a 64 y.o. male with past medical history of lymphoma who presents to clinic for annual physical exam. No acute concerns at this time. Pt reports doing well overall.    Patient Active Problem List   Diagnosis Date Noted   Port-A-Cath in place 04/30/2023   Hodgkin's lymphoma (HCC) 04/23/2023   Lymphoma (HCC) 04/17/2023   Iron  deficiency anemia 03/27/2023   Symptomatic anemia 03/10/2023   Acute blood loss anemia 03/10/2023   Abnormal CT scan 03/10/2023   Upper GI bleed 03/10/2023   Metastatic disease (HCC) 03/09/2023   GI bleeding 03/09/2023   Glaucoma 03/09/2023   Glaucoma associated with anomalies of iris 03/09/2023   Former heavy tobacco smoker 01/23/2022   Microscopic hematuria 10/29/2020   Solitary pulmonary nodule 10/29/2020   Pulmonary emphysema (HCC) 01/12/2020   Seborrheic dermatitis 01/12/2020   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     Medications Ordered Prior to Encounter[1]  Allergies[2]  Social History   Socioeconomic History   Marital status: Married    Spouse name: Not on file   Number of children: Not on file   Years of education: Not on file   Highest education level: Not on file  Occupational History   Not on file  Tobacco Use   Smoking status: Former    Passive exposure: Never   Smokeless tobacco: Not on file  Vaping Use   Vaping status: Never Used  Substance and Sexual Activity   Alcohol use: Yes    Comment: occasionally   Drug use: Yes    Types: Marijuana   Sexual activity: Not on file  Other Topics Concern   Not on file  Social History Narrative   Not on file   Social Drivers of Health   Tobacco Use: Medium Risk (05/25/2024)   Patient History    Smoking Tobacco Use: Former    Smokeless Tobacco Use:  Unknown    Passive Exposure: Never  Physicist, Medical Strain: Low Risk (04/19/2024)   Overall Financial Resource Strain (CARDIA)    Difficulty of Paying Living Expenses: Not hard at all  Food Insecurity: No Food Insecurity (03/09/2023)   Hunger Vital Sign    Worried About Running Out of Food in the Last Year: Never true    Ran Out of Food in the Last Year: Never true  Transportation Needs: No Transportation Needs (03/09/2023)   PRAPARE - Administrator, Civil Service (Medical): No    Lack of Transportation (Non-Medical): No  Physical Activity: Insufficiently Active (04/19/2024)   Exercise Vital Sign    Days of Exercise per Week: 7 days    Minutes of Exercise per Session: 20 min  Stress: No Stress Concern Present (04/19/2024)   [employer] of Occupational Health - Occupational Stress Questionnaire    Feeling of Stress: Not at all  Social Connections: Not on file  Intimate Partner Violence: Not At Risk (03/09/2023)   Humiliation, Afraid, Rape, and Kick questionnaire    Fear of Current or Ex-Partner: No    Emotionally Abused: No    Physically Abused: No    Sexually Abused: No  Depression (PHQ2-9): Low Risk (04/19/2024)   Depression (PHQ2-9)    PHQ-2 Score: 0  Alcohol Screen: Low Risk (04/19/2024)   Alcohol Screen  Last Alcohol Screening Score (AUDIT): 1  Housing: Low Risk (03/09/2023)   Housing    Last Housing Risk Score: 0  Utilities: Not At Risk (03/09/2023)   AHC Utilities    Threatened with loss of utilities: No  Health Literacy: Adequate Health Literacy (04/19/2024)   B1300 Health Literacy    Frequency of need for help with medical instructions: Never    No family history on file.  Past Surgical History:  Procedure Laterality Date   BIOPSY  03/11/2023   Procedure: BIOPSY;  Surgeon: Saintclair Jasper, MD;  Location: [facility];  Service: Gastroenterology;;   ESOPHAGOGASTRODUODENOSCOPY (EGD) WITH PROPOFOL  N/A 03/11/2023   Procedure:  ESOPHAGOGASTRODUODENOSCOPY (EGD) WITH PROPOFOL ;  Surgeon: Saintclair Jasper, MD;  Location: [facility];  Service: Gastroenterology;  Laterality: N/A;   HOT HEMOSTASIS N/A 03/11/2023   Procedure: HOT HEMOSTASIS (ARGON PLASMA COAGULATION/BICAP);  Surgeon: Saintclair Jasper, MD;  Location: [facility];  Service: Gastroenterology;  Laterality: N/A;   IR IMAGING GUIDED PORT INSERTION  03/26/2023   IR REMOVAL TUN ACCESS W/ PORT W/O FL MOD SED  12/02/2023    ROS: Review of Systems Negative except as stated above  PHYSICAL EXAM: BP 117/77   Pulse 63   Temp 98.3 F (36.8 C) (Oral)   Resp 16   Ht 5' 6 (1.676 m)   Wt 156 lb 9.6 oz (71 kg)   SpO2 99%   BMI 25.28 kg/m   Physical Exam  General: well-appearing, no acute distress Skin: no jaundice, rashes, or lesions Head: normocephalic, no lesions, no abnormal hair distribution or loss Eyes: anicteric sclera, pupils equally round and reactive to light and accommodation, extraocular movements intact, appropriate visual acuity Ears: no external lesions, tympanic membrane translucent  Nose: no septal deviation, turbinates clear Throat: trachea midline, no thyromegaly, moist mucus membranes Cardiovascular: regular heart rate and rhythm, normal S1/S2, no murmurs, gallops, or rubs, peripheral pulses 2+ bilaterally Chest: no skeletal deformity, lungs clear to auscultation bilaterally, equal breath sounds bilaterally Abdomen: soft, non-distended, non-tender to palpation, no hepatomegaly, no splenomegaly, normoactive bowel sounds Musculoskeletal: strength of upper and lower extremities equal bilaterally, 5/5, normal gait Extremities: no peripheral edema, nails intact Neurologic: brisk patellar reflexes, cranial nerves II-XII intact      Latest Ref Rng & Units 04/28/2024   10:56 AM 04/19/2024    9:11 AM 10/07/2023   11:26 AM  CMP  Glucose 70 - 99 mg/dL 878  68  853   BUN 8 - 23 mg/dL 12  12  10    Creatinine 0.61 - 1.24 mg/dL 8.99  8.98  9.21   Sodium  135 - 145 mmol/L 140  143  137   Potassium 3.5 - 5.1 mmol/L 3.8  3.9  3.4   Chloride 98 - 111 mmol/L 104  106  103   CO2 22 - 32 mmol/L 25  25  24    Calcium 8.9 - 10.3 mg/dL 89.9  9.2  9.6   Total Protein 6.5 - 8.1 g/dL 8.3  7.3  7.5   Total Bilirubin 0.0 - 1.2 mg/dL 0.4  0.3  0.3   Alkaline Phos 38 - 126 U/L 60  67  70   AST 15 - 41 U/L 30  22  14    ALT 0 - 44 U/L 21  20  15     Lipid Panel     Component Value Date/Time   CHOL 197 04/19/2024 0911   TRIG 136 04/19/2024 0911   HDL 49 04/19/2024 0911   CHOLHDL 4.0 04/19/2024  0911   LDLCALC 124 (H) 04/19/2024 0911    CBC    Component Value Date/Time   WBC 3.1 (L) 04/28/2024 1056   WBC 7.9 06/11/2023 1917   RBC 4.95 04/28/2024 1056   HGB 13.9 04/28/2024 1056   HGB 13.2 04/19/2024 0911   HCT 42.9 04/28/2024 1056   HCT 43.7 04/19/2024 0911   PLT 187 04/28/2024 1056   PLT 182 04/19/2024 0911   MCV 86.7 04/28/2024 1056   MCV 93 04/19/2024 0911   MCH 28.1 04/28/2024 1056   MCHC 32.4 04/28/2024 1056   RDW 18.2 (H) 04/28/2024 1056   RDW 16.7 (H) 04/19/2024 0911   LYMPHSABS 2.3 04/28/2024 1056   MONOABS 0.2 04/28/2024 1056   EOSABS 0.1 04/28/2024 1056   BASOSABS 0.0 04/28/2024 1056    ASSESSMENT AND PLAN:  1. Encounter for annual wellness visit (Primary) - Complete physical exam performed today, no abnormal findings. - Routine lab results to screen for heme, cardiovascular and metabolic disorders were reviewed.    Patient was given the opportunity to ask questions.  Patient verbalized understanding of the plan and was able to repeat key elements of the plan.    No orders of the defined types were placed in this encounter.    Requested Prescriptions    No prescriptions requested or ordered in this encounter    Return in about 1 year (around 05/25/2025) for physical, labs.  Sula Cower Dairon Procter, PA-C      [1]  Current Outpatient Medications on File Prior to Visit  Medication Sig Dispense Refill   meloxicam  (MOBIC) 15 MG tablet Take 15 mg by mouth daily.     calcium-vitamin D (OSCAL WITH D) 500-5 MG-MCG tablet Take 1 tablet by mouth daily.     clotrimazole -betamethasone  (LOTRISONE ) cream Apply 1 Application topically 2 (two) times daily. To area of rash on shoulder and back. 30 g 1   Cyanocobalamin  (VITAMIN B-12 PO) Take 1 tablet by mouth daily. Patient taking in liquid form     feeding supplement (ENSURE ENLIVE / ENSURE PLUS) LIQD Take 237 mLs by mouth 2 (two) times daily between meals. 237 mL 12   latanoprost  (XALATAN ) 0.005 % ophthalmic solution Place 1 drop into both eyes at bedtime.     timolol  (TIMOPTIC ) 0.5 % ophthalmic solution Place 1 drop into both eyes in the morning.     No current facility-administered medications on file prior to visit.  [2] No Known Allergies  "

## 2024-05-26 ENCOUNTER — Other Ambulatory Visit: Payer: Self-pay

## 2024-07-28 ENCOUNTER — Inpatient Hospital Stay: Admitting: Hematology

## 2024-07-28 ENCOUNTER — Inpatient Hospital Stay: Attending: Hematology
